# Patient Record
Sex: Female | Born: 1955
Health system: Southern US, Community
[De-identification: ages and names within clinical notes are randomized; demographics above are authoritative.]

## PROBLEM LIST (undated history)

## (undated) DIAGNOSIS — E079 Disorder of thyroid, unspecified: Secondary | ICD-10-CM

## (undated) DIAGNOSIS — C50919 Malignant neoplasm of unspecified site of unspecified female breast: Secondary | ICD-10-CM

## (undated) DIAGNOSIS — I4891 Unspecified atrial fibrillation: Secondary | ICD-10-CM

## (undated) DIAGNOSIS — C801 Malignant (primary) neoplasm, unspecified: Secondary | ICD-10-CM

## (undated) HISTORY — DX: Disorder of thyroid, unspecified: E07.9

## (undated) HISTORY — DX: Malignant (primary) neoplasm, unspecified: C80.1

---

## 1995-06-26 HISTORY — PX: MASTECTOMY: SHX3

## 1999-08-07 ENCOUNTER — Encounter (HOSPITAL_BASED_OUTPATIENT_CLINIC_OR_DEPARTMENT_OTHER): Payer: Self-pay | Admitting: General Surgery

## 1999-08-07 ENCOUNTER — Encounter: Admission: RE | Admit: 1999-08-07 | Discharge: 1999-08-07 | Payer: Self-pay | Admitting: General Surgery

## 2001-02-14 ENCOUNTER — Encounter: Admission: RE | Admit: 2001-02-14 | Discharge: 2001-02-14 | Payer: Self-pay | Admitting: General Surgery

## 2001-02-14 ENCOUNTER — Encounter (HOSPITAL_BASED_OUTPATIENT_CLINIC_OR_DEPARTMENT_OTHER): Payer: Self-pay | Admitting: General Surgery

## 2001-12-09 ENCOUNTER — Ambulatory Visit (HOSPITAL_COMMUNITY): Admission: RE | Admit: 2001-12-09 | Discharge: 2001-12-09 | Payer: Self-pay | Admitting: General Surgery

## 2001-12-09 ENCOUNTER — Encounter (INDEPENDENT_AMBULATORY_CARE_PROVIDER_SITE_OTHER): Payer: Self-pay

## 2001-12-09 ENCOUNTER — Encounter: Payer: Self-pay | Admitting: General Surgery

## 2002-01-06 ENCOUNTER — Encounter: Payer: Self-pay | Admitting: General Surgery

## 2002-01-12 ENCOUNTER — Encounter (INDEPENDENT_AMBULATORY_CARE_PROVIDER_SITE_OTHER): Payer: Self-pay | Admitting: Specialist

## 2002-01-12 ENCOUNTER — Observation Stay (HOSPITAL_COMMUNITY): Admission: RE | Admit: 2002-01-12 | Discharge: 2002-01-13 | Payer: Self-pay | Admitting: General Surgery

## 2002-02-26 ENCOUNTER — Encounter: Admission: RE | Admit: 2002-02-26 | Discharge: 2002-02-26 | Payer: Self-pay | Admitting: General Surgery

## 2002-02-26 ENCOUNTER — Encounter (HOSPITAL_BASED_OUTPATIENT_CLINIC_OR_DEPARTMENT_OTHER): Payer: Self-pay | Admitting: General Surgery

## 2006-01-24 ENCOUNTER — Encounter: Admission: RE | Admit: 2006-01-24 | Discharge: 2006-01-24 | Payer: Self-pay | Admitting: Family Medicine

## 2007-02-12 ENCOUNTER — Encounter: Admission: RE | Admit: 2007-02-12 | Discharge: 2007-02-12 | Payer: Self-pay | Admitting: Family Medicine

## 2007-02-27 ENCOUNTER — Encounter: Admission: RE | Admit: 2007-02-27 | Discharge: 2007-02-27 | Payer: Self-pay | Admitting: Family Medicine

## 2008-02-26 ENCOUNTER — Encounter: Admission: RE | Admit: 2008-02-26 | Discharge: 2008-02-26 | Payer: Self-pay | Admitting: Family Medicine

## 2008-03-04 ENCOUNTER — Encounter: Admission: RE | Admit: 2008-03-04 | Discharge: 2008-03-04 | Payer: Self-pay | Admitting: Family Medicine

## 2009-03-03 ENCOUNTER — Encounter: Admission: RE | Admit: 2009-03-03 | Discharge: 2009-03-03 | Payer: Self-pay | Admitting: Family Medicine

## 2009-03-10 ENCOUNTER — Encounter: Admission: RE | Admit: 2009-03-10 | Discharge: 2009-03-10 | Payer: Self-pay | Admitting: Family Medicine

## 2010-03-21 ENCOUNTER — Encounter: Admission: RE | Admit: 2010-03-21 | Discharge: 2010-03-21 | Payer: Self-pay | Admitting: Family Medicine

## 2010-11-10 NOTE — Op Note (Signed)
[facility]  Patient:    LAQUASHIA, MERGENTHALER Visit Number: [id number] MRN: [id number]          Service Type: SUR Location: [id number] [id number] Attending Physician:  Caleen Essex Dictated by:   Ollen Gross. Vernell Morgans, M.D. Proc. Date: 01/12/02 Admit Date:  01/12/2002 Discharge Date: 01/13/2002                             Operative Report  PREOPERATIVE DIAGNOSIS:  Diffusely enlarged thyroid with atypical follicular cells on biopsy of a left lobe nodule.  POSTOPERATIVE DIAGNOSIS:  Diffusely enlarged thyroid with atypical follicular cells on biopsy of a left lobe nodule.  PROCEDURE:  Total thyroidectomy.  SURGEON:  Ollen Gross. Vernell Morgans, M.D.  ASSISTANT:  Velora Heckler, M.D.  ANESTHESIA:  General endotracheal.  DESCRIPTION OF PROCEDURE:  After informed consent was obtained, the patient was brought to the operating room and placed in the supine position on the operating table. After adequate induction of general anesthesia, the patients neck was prepped with Betadine and draped in the usual sterile manner. The patient had had a prior isthmusectomy of her thyroid gland back in 1991 and a transverse cervical incision was made along the line of her previous incision with a 15 blade knife. This incision was carried down through the skin, subcutaneous tissue and platysma using the Bovie electrocautery. The platysma was then grasped with Allis clamps initially superiorly and a subplatysmal flap was created up to the thyroid notch. The platysma inferiorly was then grasped with the Allis clamps and another subplatysmal flap was created down near the sternal notch. Using sharp dissection with the electrocautery, the midline of the neck was identified and opened with the electrocautery until the thyroid gland was identified. Attention was initially turned towards the left lobe of the thyroid. The muscle was densely adherent to the thyroid gland from her prior operation and using  careful dissection with both the electrocautery as well as blunt dissection with a Kidner, the muscle was moved away from the surface of the thyroid gland. The thyroid gland was then able to rotated up medially. Several small veins laterally were identified using blunt dissection and each of these was clipped with small clips, two proximally, one distally and then the veins were divided between the two. Attention was then turned to the superior pole and using careful dissection right on the surface of the gland, the superior pole was identified and surrounded with a right angled clamp. The 2-0 silk ties were passed around the superior pole and tied proximally and distally and the tissue was then divided between the two ties. Once the superior pole was mobilized, attention was then turned to the mid portion and lower pole of the thyroid gland. Again rotating the thyroid medially, blunt dissection was carried out along the surface of the thyroid gland. The superior parathyroid on the left side was identified. The dissection of the mid portion and inferior pole of the thyroid gland was carried out bluntly again on the surface of the gland. The inferior parathyroid was not identified and was not encountered during this dissection. Once the thyroid gland had been mobilized, the nerve deep to the thyroid gland was identified and care was taken to maintain a safe distance from this nerve. Once the thyroid had been rotated up beyond where the nerve was located, dissection was then carried out to separate the thyroid from the trachea using more  sharp dissection with the electrocautery until the gland was free and able to be removed. This was sent to the pathologist for further evaluation. Several small vessels on the pretracheal space were identified and were either controlled with the electrocautery or with small clips. A piece of Surgicel and gauze packing was then placed in the bed where the  thyroid gland used to be. Attention was then turned to the right side and again tedious dissection was carried out to separate the thyroid gland from the muscles. Once this had been accomplished, the muscles were retracted laterally and blunt dissection was carried out to free the anterior surface of the thyroid gland until the thyroid gland could be mobilized more medially with some finger retraction. Several small veins on the anterior surface of the thyroid gland required ligation with clips and division. Attention was then turned to the superior pole where using blunt dissection with the right angled clamp, the superior pole vessels were dissected right on the surface of the thyroid gland. The 2-0 silk ties were passed around these vessels and ligated and the superior pole vessels were divided between the two ties. A second medium clip was placed on the superior pole vessels. Some of the superior pole was then separated from the trachea using sharp dissection with the electrocautery. Attention was then turned to the medial and inferior pole areas of the thyroid gland where continued blunt dissection was carried out so that the thyroid gland could be rolled medially under finger retraction. Posteriorly the inferior thyroid artery and middle thyroid vein were identified and were clipped with small clips and divided. The gland was then rolled more medially in the area where the nerve was located and care was taken to stay a safe distance away from the nerve. There were significant adhesions in this area because of a prior dissection. Once the gland had been rolled medially and the nerve was identified, the rest of the posterior dissection was carried out with the electrocautery to separate the thyroid gland from the trachea. Several small vessels on the anterior surface of the trachea were controlled with either clips or electrocautery. The neck was then irrigated with saline and  appeared to be hemostatic. Another small piece of Surgicel was placed in the bed where the right lobe had been. The wound was examined again and found to be  hemostatic. The strap muscles were then reapproximated along the midline with interrupted 3-0 Vicryl stitches. The platysma was reapproximated using interrupted 3-0 Vicryl stitches and the skin was closed with running 4-0 Monocryl subcuticular stitch. Benzoin and Steri-Strips were applied as well as a sterile dressing. The patient tolerated the procedure well. At the end of the case, all needle, sponge and instrument counts were correct. The patient was then awakened and taken to the recovery room in stable condition. Of note, the right sided superior parathyroid gland was also identified and the inferior was never encountered during the dissection. Dictated by:   Ollen Gross. Vernell Morgans, M.D. Attending Physician:  Caleen Essex DD:  01/14/02 TD:  01/19/02 Job: [id number]

## 2011-04-30 ENCOUNTER — Other Ambulatory Visit: Payer: Self-pay | Admitting: Family Medicine

## 2011-04-30 DIAGNOSIS — Z9012 Acquired absence of left breast and nipple: Secondary | ICD-10-CM

## 2011-04-30 DIAGNOSIS — Z1231 Encounter for screening mammogram for malignant neoplasm of breast: Secondary | ICD-10-CM

## 2011-05-01 ENCOUNTER — Ambulatory Visit
Admission: RE | Admit: 2011-05-01 | Discharge: 2011-05-01 | Disposition: A | Discharge status: Home / Self Care | Payer: 59 | Source: Ambulatory Visit | Attending: Family Medicine | Admitting: Family Medicine

## 2011-05-01 ENCOUNTER — Other Ambulatory Visit: Payer: Self-pay | Admitting: Family Medicine

## 2011-05-01 DIAGNOSIS — Z9012 Acquired absence of left breast and nipple: Secondary | ICD-10-CM

## 2011-05-01 DIAGNOSIS — Z1231 Encounter for screening mammogram for malignant neoplasm of breast: Secondary | ICD-10-CM

## 2012-04-08 ENCOUNTER — Other Ambulatory Visit: Payer: Self-pay | Admitting: Family Medicine

## 2012-04-08 DIAGNOSIS — Z1231 Encounter for screening mammogram for malignant neoplasm of breast: Secondary | ICD-10-CM

## 2012-04-08 DIAGNOSIS — Z9012 Acquired absence of left breast and nipple: Secondary | ICD-10-CM

## 2012-05-15 ENCOUNTER — Ambulatory Visit
Admission: RE | Admit: 2012-05-15 | Discharge: 2012-05-15 | Disposition: A | Discharge status: Home / Self Care | Payer: 59 | Source: Ambulatory Visit | Attending: Family Medicine | Admitting: Family Medicine

## 2012-05-15 DIAGNOSIS — Z1231 Encounter for screening mammogram for malignant neoplasm of breast: Secondary | ICD-10-CM

## 2012-05-15 DIAGNOSIS — Z9012 Acquired absence of left breast and nipple: Secondary | ICD-10-CM

## 2013-02-17 ENCOUNTER — Other Ambulatory Visit (INDEPENDENT_AMBULATORY_CARE_PROVIDER_SITE_OTHER): Payer: 59

## 2013-02-17 DIAGNOSIS — E039 Hypothyroidism, unspecified: Secondary | ICD-10-CM

## 2013-02-17 DIAGNOSIS — Z0000 Encounter for general adult medical examination without abnormal findings: Secondary | ICD-10-CM

## 2013-02-17 LAB — POCT CBC
HCT, POC: 39.7 % (ref 37.7–47.9)
Hemoglobin: 13.1 g/dL (ref 12.2–16.2)
Lymph, poc: 1.5 (ref 0.6–3.4)
MCHC: 33.1 g/dL (ref 31.8–35.4)
MCV: 88 fL (ref 80–97)
POC Granulocyte: 4.3 (ref 2–6.9)
WBC: 6.1 10*3/uL (ref 4.6–10.2)

## 2013-02-19 LAB — BMP8+EGFR
BUN/Creatinine Ratio: 13 (ref 9–23)
Chloride: 100 mmol/L (ref 97–108)
GFR calc Af Amer: 101 mL/min/{1.73_m2} (ref >59)
Glucose: 85 mg/dL (ref 65–99)
Potassium: 4.9 mmol/L (ref 3.5–5.2)

## 2013-02-19 LAB — HEPATIC FUNCTION PANEL
AST: 17 IU/L (ref 0–40)
Alkaline Phosphatase: 39 IU/L (ref 39–117)
Bilirubin, Direct: 0.11 mg/dL (ref 0.00–0.40)
Total Protein: 6.8 g/dL (ref 6.0–8.5)

## 2013-02-19 LAB — THYROID PANEL WITH TSH
Free Thyroxine Index: 3 (ref 1.2–4.9)
T3 Uptake Ratio: 29 % (ref 24–39)
T4, Total: 10.3 ug/dL (ref 4.5–12.0)

## 2013-02-19 LAB — NMR, LIPOPROFILE
Cholesterol: 198 mg/dL (ref <200)
HDL Particle Number: 41.4 umol/L (ref >=30.5)
LDL Size: 21.2 nm (ref >20.5)
LP-IR Score: <25 (ref <=45)

## 2013-02-20 ENCOUNTER — Telehealth: Payer: Self-pay | Admitting: Nurse Practitioner

## 2013-02-20 MED ORDER — LEVOTHYROXINE SODIUM 88 MCG PO TABS: 88.0000 ug | ORAL_TABLET | Freq: Every day | ORAL | Status: DC

## 2013-02-20 NOTE — Telephone Encounter (Signed)
Thyroid med rf'd and pts labs were normal

## 2013-02-20 NOTE — Patient Instructions (Signed)
Pt needs rx for thyroid pil will call back with dose

## 2013-03-23 ENCOUNTER — Ambulatory Visit (INDEPENDENT_AMBULATORY_CARE_PROVIDER_SITE_OTHER): Payer: 59 | Admitting: Family Medicine

## 2013-03-23 VITALS — BP 128/72 | HR 80 | Temp 97.5°F | Ht 65.5 in | Wt 149.8 lb

## 2013-03-23 DIAGNOSIS — D72829 Elevated white blood cell count, unspecified: Secondary | ICD-10-CM | POA: Insufficient documentation

## 2013-03-23 DIAGNOSIS — R509 Fever, unspecified: Secondary | ICD-10-CM

## 2013-03-23 LAB — POCT URINALYSIS DIPSTICK
Bilirubin, UA: NEGATIVE
Glucose, UA: NEGATIVE
Ketones, UA: NEGATIVE
Nitrite, UA: POSITIVE
Protein, UA: 200
Spec Grav, UA: 1.01
Urobilinogen, UA: NEGATIVE
pH, UA: 6.5

## 2013-03-23 LAB — POCT CBC
Granulocyte percent: 82.7 %G — AB (ref 37–80)
HCT, POC: 36.9 % — AB (ref 37.7–47.9)
Hemoglobin: 12.2 g/dL (ref 12.2–16.2)
Lymph, poc: 1.7 (ref 0.6–3.4)
MCH, POC: 28.4 pg (ref 27–31.2)
MCHC: 33.1 g/dL (ref 31.8–35.4)
MCV: 85.8 fL (ref 80–97)
MPV: 8.1 fL (ref 0–99.8)
POC Granulocyte: 10.3 — AB (ref 2–6.9)
POC LYMPH PERCENT: 13.7 %L (ref 10–50)
Platelet Count, POC: 223 10*3/uL (ref 142–424)
RBC: 4.3 M/uL (ref 4.04–5.48)
RDW, POC: 13.7 %
WBC: 12.5 10*3/uL — AB (ref 4.6–10.2)

## 2013-03-23 LAB — POCT INFLUENZA A/B
Influenza A, POC: NEGATIVE
Influenza B, POC: NEGATIVE

## 2013-03-23 LAB — POCT UA - MICROSCOPIC ONLY
Casts, Ur, LPF, POC: NEGATIVE
Crystals, Ur, HPF, POC: NEGATIVE
Mucus, UA: NEGATIVE
Yeast, UA: NEGATIVE

## 2013-03-23 MED ORDER — AMOXICILLIN 875 MG PO TABS: 875.0000 mg | ORAL_TABLET | Freq: Two times a day (BID) | ORAL | Status: DC

## 2013-03-23 NOTE — Progress Notes (Signed)
Patient ID: MELODI HAPPEL, female   DOB: July 20, 1955, 57 y.o.   MRN: [id number] SUBJECTIVE: CC: Chief Complaint  Patient presents with  . Fever    started Wednesday, no other sxs    HPI: Fever and chills since last Wednesday.yesterday was 101. No urinary Sx. Nothing else but fever,no sore throat, no runny nose. Was achey.  Past Medical History  Diagnosis Date  . Thyroid disease   . Cancer    Past Surgical History  Procedure Laterality Date  . Mastectomy Left 1997   History   Social History  . Marital Status: Divorced    Spouse Name: N/A    Number of Children: N/A  . Years of Education: N/A   Occupational History  . Not on file.   Social History Main Topics  . Smoking status: Never Smoker   . Smokeless tobacco: Not on file  . Alcohol Use: No  . Drug Use: No  . Sexual Activity: Not on file   Other Topics Concern  . Not on file   Social History Narrative  . No narrative on file   Family History  Problem Relation Age of Onset  . Heart disease Mother   . Hypertension Mother   . Cancer Sister   . Cancer Sister   . Cancer Sister    Current Outpatient Prescriptions on File Prior to Visit  Medication Sig Dispense Refill  . levothyroxine (SYNTHROID, LEVOTHROID) 88 MCG tablet Take 1 tablet (88 mcg total) by mouth daily.  90 tablet  3   No current facility-administered medications on file prior to visit.   No Known Allergies  There is no immunization history on file for this patient. Prior to Admission medications   Medication Sig Start Date End Date Taking? Authorizing Provider  levothyroxine (SYNTHROID, LEVOTHROID) 88 MCG tablet Take 1 tablet (88 mcg total) by mouth daily. 02/20/13  Yes Mary-Margaret Daphine Deutscher, FNP     ROS: As above in the HPI. All other systems are stable or negative.  OBJECTIVE: APPEARANCE:  Patient in no acute distress.The patient appeared well nourished and normally developed. Acyanotic. Waist: VITAL SIGNS:BP 128/72  Pulse 80   Temp(Src) 97.5 F (36.4 C) (Oral)  Ht 5' 5.5" (1.664 m)  Wt 149 lb 12.8 oz (67.949 kg)  BMI 24.54 kg/m2 WF  SKIN: warm and  Dry without overt rashes, tattoos and scars  HEAD and Neck: without JVD, Head and scalp: normal Eyes:No scleral icterus. Fundi normal, eye movements normal. Ears: Auricle normal, canal normal, Tympanic membranes normal, insufflation normal. Nose: normal Throat: normal Neck & thyroid: normal  CHEST & LUNGS: Chest wall: normal Lungs: Clear  CVS: Reveals the PMI to be normally located. Regular rhythm, First and Second Heart sounds are normal,  absence of murmurs, rubs or gallops. Peripheral vasculature: Radial pulses: normal Dorsal pedis pulses: normal Posterior pulses: normal  ABDOMEN:  Appearance: normal Benign, no organomegaly, no masses, no Abdominal Aortic enlargement. No Guarding , no rebound. No Bruits. Bowel sounds: normal  RECTAL: N/A GU: N/A  EXTREMETIES: nonedematous.  MUSCULOSKELETAL:  Spine: normal Joints: intact  NEUROLOGIC: oriented to time,place and person; nonfocal. Strength is normal Sensory is normal Reflexes are normal Cranial Nerves are normal.  Results for orders placed in visit on 03/23/13  POCT CBC      Result Value Range   WBC 12.5 (*) 4.6 - 10.2 K/uL   Lymph, poc 1.7  0.6 - 3.4   POC LYMPH PERCENT 13.7  10 - 50 %L   POC  Granulocyte 10.3 (*) 2 - 6.9   Granulocyte percent 82.7 (*) 37 - 80 %G   RBC 4.3  4.04 - 5.48 M/uL   Hemoglobin 12.2  12.2 - 16.2 g/dL   HCT, POC 25.3 (*) 66.4 - 47.9 %   MCV 85.8  80 - 97 fL   MCH, POC 28.4  27 - 31.2 pg   MCHC 33.1  31.8 - 35.4 g/dL   RDW, POC 40.3     Platelet Count, POC 223.0  142 - 424 K/uL   MPV 8.1  0 - 99.8 fL  POCT INFLUENZA A/B      Result Value Range   Influenza A, POC Negative     Influenza B, POC Negative    POCT UA - MICROSCOPIC ONLY      Result Value Range   WBC, Ur, HPF, POC 15-20     RBC, urine, microscopic 10-15     Bacteria, U Microscopic many      Mucus, UA neg     Epithelial cells, urine per micros occ     Crystals, Ur, HPF, POC neg     Casts, Ur, LPF, POC neg     Yeast, UA neg    POCT URINALYSIS DIPSTICK      Result Value Range   Color, UA yellow     Clarity, UA cloudy     Glucose, UA neg     Bilirubin, UA neg     Ketones, UA neg     Spec Grav, UA 1.010     Blood, UA large     pH, UA 6.5     Protein, UA 200     Urobilinogen, UA negative     Nitrite, UA pos     Leukocytes, UA large (3+)      ASSESSMENT: Fever, unspecified - Plan: POCT CBC, POCT Influenza A/B, POCT UA - Microscopic Only, POCT urinalysis dipstick, Urine culture, amoxicillin (AMOXIL) 875 MG tablet  Leucocytosis  Suspect that she has a viral syndrome , however with the leukocytosis and left shift will cover for a respiratory infection and recheck in a few days.  PLAN: Orders Placed This Encounter  Procedures  . Urine culture  . POCT CBC  . POCT Influenza A/B  . POCT UA - Microscopic Only  . POCT urinalysis dipstick   Meds ordered this encounter  Medications  . amoxicillin (AMOXIL) 875 MG tablet    Sig: Take 1 tablet (875 mg total) by mouth 2 (two) times daily.    Dispense:  20 tablet    Refill:  0    Return in about 4 days (around 03/27/2013).  Gianmarco Roye P. Modesto Charon, M.D.

## 2013-03-25 LAB — URINE CULTURE

## 2013-03-26 ENCOUNTER — Other Ambulatory Visit: Payer: Self-pay | Admitting: Family Medicine

## 2013-03-26 DIAGNOSIS — N390 Urinary tract infection, site not specified: Secondary | ICD-10-CM

## 2013-03-26 MED ORDER — CIPROFLOXACIN HCL 500 MG PO TABS: 500.0000 mg | ORAL_TABLET | Freq: Two times a day (BID) | ORAL | Status: DC

## 2013-03-26 NOTE — Progress Notes (Signed)
Quick Note:  Labs abnormal. UTI Will need to change antibiotic to cipro ______

## 2013-03-27 ENCOUNTER — Ambulatory Visit: Payer: 59 | Admitting: Family Medicine

## 2013-03-31 ENCOUNTER — Telehealth: Payer: Self-pay | Admitting: Family Medicine

## 2013-04-07 ENCOUNTER — Ambulatory Visit (INDEPENDENT_AMBULATORY_CARE_PROVIDER_SITE_OTHER): Payer: 59 | Admitting: Nurse Practitioner

## 2013-04-07 ENCOUNTER — Encounter (INDEPENDENT_AMBULATORY_CARE_PROVIDER_SITE_OTHER): Payer: Self-pay

## 2013-04-07 VITALS — BP 130/84 | HR 74 | Temp 98.4°F | Ht 64.0 in | Wt 144.0 lb

## 2013-04-07 DIAGNOSIS — E039 Hypothyroidism, unspecified: Secondary | ICD-10-CM

## 2013-04-07 DIAGNOSIS — Z01419 Encounter for gynecological examination (general) (routine) without abnormal findings: Secondary | ICD-10-CM

## 2013-04-07 DIAGNOSIS — Z124 Encounter for screening for malignant neoplasm of cervix: Secondary | ICD-10-CM

## 2013-04-07 DIAGNOSIS — Z0000 Encounter for general adult medical examination without abnormal findings: Secondary | ICD-10-CM

## 2013-04-07 LAB — POCT URINALYSIS DIPSTICK
Bilirubin, UA: NEGATIVE
Glucose, UA: NEGATIVE
Nitrite, UA: NEGATIVE

## 2013-04-07 LAB — POCT CBC
Lymph, poc: 1.8 (ref 0.6–3.4)
MCH, POC: 30 pg (ref 27–31.2)
MCHC: 34.3 g/dL (ref 31.8–35.4)
MPV: 6.8 fL (ref 0–99.8)
POC LYMPH PERCENT: 29.3 %L (ref 10–50)
Platelet Count, POC: 288 10*3/uL (ref 142–424)
RBC: 4.2 M/uL (ref 4.04–5.48)
RDW, POC: 14.5 %
WBC: 6.3 10*3/uL (ref 4.6–10.2)

## 2013-04-07 LAB — POCT UA - MICROSCOPIC ONLY
Mucus, UA: NEGATIVE
Yeast, UA: NEGATIVE

## 2013-04-07 MED ORDER — LEVOTHYROXINE SODIUM 88 MCG PO TABS: 88.0000 ug | ORAL_TABLET | Freq: Every day | ORAL | Status: DC

## 2013-04-07 NOTE — Patient Instructions (Signed)

## 2013-04-07 NOTE — Progress Notes (Signed)
Subjective:    Patient ID: Angela SAMPSEL, female    DOB: 10-15-1955, 57 y.o.   MRN: [id number]  HPI  Patient here today for CPE and PAP- SHe just came off antibiotic for fever of unknown origin. She is doing better- No fever. No other complaints tooday. Patient Active Problem List   Diagnosis Date Noted  . Leucocytosis 03/23/2013  . Fever, unspecified 03/23/2013   Outpatient Encounter Prescriptions as of 04/07/2013  Medication Sig Dispense Refill  . levothyroxine (SYNTHROID, LEVOTHROID) 88 MCG tablet Take 1 tablet (88 mcg total) by mouth daily.  90 tablet  3  . [DISCONTINUED] amoxicillin (AMOXIL) 875 MG tablet Take 1 tablet (875 mg total) by mouth 2 (two) times daily.  20 tablet  0  . [DISCONTINUED] ciprofloxacin (CIPRO) 500 MG tablet Take 1 tablet (500 mg total) by mouth 2 (two) times daily.  20 tablet  0   No facility-administered encounter medications on file as of 04/07/2013.       Review of Systems  Constitutional: Negative.   HENT: Negative.   Eyes: Negative.   Respiratory: Negative.   Cardiovascular: Negative.   Gastrointestinal: Negative.   Endocrine: Negative.   Genitourinary: Negative.   Musculoskeletal: Negative.   Neurological: Negative.   Hematological: Negative.   Psychiatric/Behavioral: Negative.        Objective:   Physical Exam  Constitutional: She is oriented to person, place, and time. She appears well-developed and well-nourished.  HENT:  Head: Normocephalic.  Right Ear: Hearing, tympanic membrane, external ear and ear canal normal.  Left Ear: Hearing, tympanic membrane, external ear and ear canal normal.  Nose: Nose normal.  Mouth/Throat: Uvula is midline and oropharynx is clear and moist.  Eyes: Conjunctivae and EOM are normal. Pupils are equal, round, and reactive to light.  Neck: Normal range of motion and full passive range of motion without pain. Neck supple. No JVD present. Carotid bruit is not present. No mass and no thyromegaly present.   Cardiovascular: Normal rate, normal heart sounds and intact distal pulses.   No murmur heard. Pulmonary/Chest: Effort normal and breath sounds normal. Right breast exhibits no inverted nipple, no mass, no nipple discharge, no skin change and no tenderness.  Left mastectomy- reconstruction  Abdominal: Soft. Bowel sounds are normal. She exhibits no mass. There is no tenderness.  Genitourinary: Vagina normal and uterus normal. Guaiac negative stool. No breast swelling, tenderness, discharge or bleeding.  bimanual exam-No adnexal masses or tenderness. Cervix nonparous and pink- no discharge  Musculoskeletal: Normal range of motion.  Lymphadenopathy:    She has no cervical adenopathy.  Neurological: She is alert and oriented to person, place, and time.  Skin: Skin is warm and dry.  Psychiatric: She has a normal mood and affect. Her behavior is normal. Judgment and thought content normal.   BP 130/84  Pulse 74  Temp(Src) 98.4 F (36.9 C) (Oral)  Ht 5\' 4"  (1.626 m)  Wt 144 lb (65.318 kg)  BMI 24.71 kg/m2  Results for orders placed in visit on 04/07/13  POCT UA - MICROSCOPIC ONLY      Result Value Range   WBC, Ur, HPF, POC 5-10     RBC, urine, microscopic 1-5     Bacteria, U Microscopic neg     Mucus, UA neg     Epithelial cells, urine per micros occ     Crystals, Ur, HPF, POC neg     Casts, Ur, LPF, POC neg     Yeast, UA neg  POCT URINALYSIS DIPSTICK      Result Value Range   Color, UA yellow     Clarity, UA clear     Glucose, UA neg     Bilirubin, UA neg     Ketones, UA neg     Spec Grav, UA <=1.005     Blood, UA trace     pH, UA 7.5     Protein, UA 100     Urobilinogen, UA negative     Nitrite, UA neg     Leukocytes, UA Trace           Assessment & Plan:   1. Encounter for routine gynecological examination   2. Annual physical exam   3. Unspecified hypothyroidism    Orders Placed This Encounter  Procedures  . POCT UA - Microscopic Only  . POCT urinalysis  dipstick  . POCT CBC   Meds ordered this encounter  Medications  . levothyroxine (SYNTHROID, LEVOTHROID) 88 MCG tablet    Sig: Take 1 tablet (88 mcg total) by mouth daily.    Dispense:  90 tablet    Refill:  3    Order Specific Question:  Supervising Provider    Answer:  Deborra Medina    Continue all meds Labs pending Diet and exercise encouraged Health maintenance reviewed Follow up in 6 month  Mary-Margaret Daphine Deutscher, FNP

## 2013-04-08 ENCOUNTER — Encounter: Payer: Self-pay | Admitting: Nurse Practitioner

## 2013-04-09 ENCOUNTER — Encounter: Payer: Self-pay | Admitting: *Deleted

## 2013-04-10 ENCOUNTER — Telehealth: Payer: Self-pay | Admitting: Nurse Practitioner

## 2013-04-10 LAB — PAP IG W/ RFLX HPV ASCU

## 2013-04-13 NOTE — Telephone Encounter (Signed)
PAP wnl- repeat in 2 years

## 2013-04-13 NOTE — Telephone Encounter (Signed)
Please review pap. patient is aware of all other labs

## 2013-04-13 NOTE — Telephone Encounter (Signed)
Patient aware.

## 2013-04-17 ENCOUNTER — Other Ambulatory Visit: Payer: Self-pay

## 2013-04-17 DIAGNOSIS — Z9012 Acquired absence of left breast and nipple: Secondary | ICD-10-CM

## 2013-04-17 DIAGNOSIS — Z1231 Encounter for screening mammogram for malignant neoplasm of breast: Secondary | ICD-10-CM

## 2013-05-19 ENCOUNTER — Ambulatory Visit
Admission: RE | Admit: 2013-05-19 | Discharge: 2013-05-19 | Disposition: A | Discharge status: Home / Self Care | Payer: 59 | Source: Ambulatory Visit

## 2013-05-19 DIAGNOSIS — Z9012 Acquired absence of left breast and nipple: Secondary | ICD-10-CM

## 2013-05-19 DIAGNOSIS — Z1231 Encounter for screening mammogram for malignant neoplasm of breast: Secondary | ICD-10-CM

## 2013-09-17 ENCOUNTER — Other Ambulatory Visit: Payer: Self-pay | Admitting: *Deleted

## 2013-09-17 MED ORDER — LEVOTHYROXINE SODIUM 88 MCG PO TABS: 88.0000 ug | ORAL_TABLET | Freq: Every day | ORAL | Status: DC

## 2013-11-10 ENCOUNTER — Other Ambulatory Visit: Payer: Self-pay | Admitting: Nurse Practitioner

## 2014-04-09 ENCOUNTER — Other Ambulatory Visit: Payer: Self-pay

## 2014-04-13 ENCOUNTER — Telehealth: Payer: Self-pay | Admitting: Nurse Practitioner

## 2014-04-13 MED ORDER — LEVOTHYROXINE SODIUM 88 MCG PO TABS: ORAL_TABLET | ORAL | Status: DC

## 2014-04-13 NOTE — Telephone Encounter (Signed)
Pt has appt in December, needs refill until appt. Last thyroid lab 8/14.

## 2014-06-03 ENCOUNTER — Ambulatory Visit (INDEPENDENT_AMBULATORY_CARE_PROVIDER_SITE_OTHER): Payer: PRIVATE HEALTH INSURANCE | Admitting: Nurse Practitioner

## 2014-06-03 ENCOUNTER — Encounter: Payer: Self-pay | Admitting: Nurse Practitioner

## 2014-06-03 VITALS — BP 118/68 | HR 69 | Temp 97.1°F | Ht 64.0 in | Wt 146.0 lb

## 2014-06-03 DIAGNOSIS — Z13820 Encounter for screening for osteoporosis: Secondary | ICD-10-CM

## 2014-06-03 DIAGNOSIS — E039 Hypothyroidism, unspecified: Secondary | ICD-10-CM | POA: Insufficient documentation

## 2014-06-03 NOTE — Progress Notes (Signed)
   Subjective:    Patient ID: Angela Dean, female    DOB: June 29, 1955, 58 y.o.   MRN: [id number]  HPI Patient in today for follow up of hypothyroidiusm- SHe is currently on levothyroxine 79mg daily- Is doing well without complaints.    Review of Systems  Constitutional: Negative.   HENT: Negative.   Respiratory: Negative.   Cardiovascular: Negative.   Genitourinary: Negative.   Neurological: Negative.   Psychiatric/Behavioral: Negative.   All other systems reviewed and are negative.      Objective:   Physical Exam  Constitutional: She is oriented to person, place, and time. She appears well-developed and well-nourished.  HENT:  Nose: Nose normal.  Mouth/Throat: Oropharynx is clear and moist.  Eyes: EOM are normal.  Neck: Trachea normal, normal range of motion and full passive range of motion without pain. Neck supple. No JVD present. Carotid bruit is not present. No thyromegaly present.  Cardiovascular: Normal rate, regular rhythm, normal heart sounds and intact distal pulses.  Exam reveals no gallop and no friction rub.   No murmur heard. Pulmonary/Chest: Effort normal and breath sounds normal.  Abdominal: Soft. Bowel sounds are normal. She exhibits no distension and no mass. There is no tenderness.  Musculoskeletal: Normal range of motion.  Lymphadenopathy:    She has no cervical adenopathy.  Neurological: She is alert and oriented to person, place, and time. She has normal reflexes.  Skin: Skin is warm and dry.  Psychiatric: She has a normal mood and affect. Her behavior is normal. Judgment and thought content normal.   BP 118/68 mmHg  Pulse 69  Temp(Src) 97.1 F (36.2 C) (Oral)  Ht _0  (1.626 m)  Wt 146 lb (66.225 kg)  BMI 25.05 kg/m2        Assessment & Plan:  1. Hypothyroidism, unspecified hypothyroidism type - CMP14+EGFR - NMR, lipoprofile - Thyroid Panel With TSH  2. Screening for osteoporosis - DG Bone Density; Future   hemoccult cards given to  patient with directions Labs pending Health maintenance reviewed Diet and exercise encouraged Continue all meds Follow up  In 6 months   MBay Park FNP

## 2014-06-03 NOTE — Patient Instructions (Signed)
Hypothyroidism The thyroid is a large gland located in the lower front of your neck. The thyroid gland helps control metabolism. Metabolism is how your body handles food. It controls metabolism with the hormone thyroxine. When this gland is underactive (hypothyroid), it produces too little hormone.  CAUSES These include:   Absence or destruction of thyroid tissue.  Goiter due to iodine deficiency.  Goiter due to medications.  Congenital defects (since birth).  Problems with the pituitary. This causes a lack of TSH (thyroid stimulating hormone). This hormone tells the thyroid to turn out more hormone. SYMPTOMS  Lethargy (feeling as though you have no energy)  Cold intolerance  Weight gain (in spite of normal food intake)  Dry skin  Coarse hair  Menstrual irregularity (if severe, may lead to infertility)  Slowing of thought processes Cardiac problems are also caused by insufficient amounts of thyroid hormone. Hypothyroidism in the newborn is cretinism, and is an extreme form. It is important that this form be treated adequately and immediately or it will lead rapidly to retarded physical and mental development. DIAGNOSIS  To prove hypothyroidism, your caregiver may do blood tests and ultrasound tests. Sometimes the signs are hidden. It may be necessary for your caregiver to watch this illness with blood tests either before or after diagnosis and treatment. TREATMENT  Low levels of thyroid hormone are increased by using synthetic thyroid hormone. This is a safe, effective treatment. It usually takes about four weeks to gain the full effects of the medication. After you have the full effect of the medication, it will generally take another four weeks for problems to leave. Your caregiver may start you on low doses. If you have had heart problems the dose may be gradually increased. It is generally not an emergency to get rapidly to normal. HOME CARE INSTRUCTIONS   Take your  medications as your caregiver suggests. Let your caregiver know of any medications you are taking or start taking. Your caregiver will help you with dosage schedules.  As your condition improves, your dosage needs may increase. It will be necessary to have continuing blood tests as suggested by your caregiver.  Report all suspected medication side effects to your caregiver. SEEK MEDICAL CARE IF: Seek medical care if you develop:  Sweating.  Tremulousness (tremors).  Anxiety.  Rapid weight loss.  Heat intolerance.  Emotional swings.  Diarrhea.  Weakness. SEEK IMMEDIATE MEDICAL CARE IF:  You develop chest pain, an irregular heart beat (palpitations), or a rapid heart beat. MAKE SURE YOU:   Understand these instructions.  Will watch your condition.  Will get help right away if you are not doing well or get worse. Document Released: 06/11/2005 Document Revised: 09/03/2011 Document Reviewed: 01/30/2008 Exit[employer] Patient Information 2015 [employer]. This information is not intended to replace advice given to you by your health care provider. Make sure you discuss any questions you have with your health care provider.  

## 2014-06-04 LAB — CMP14+EGFR
ALT: 10 IU/L (ref 0–32)
AST: 15 IU/L (ref 0–40)
Albumin/Globulin Ratio: 2 (ref 1.1–2.5)
Albumin: 4.5 g/dL (ref 3.5–5.5)
Alkaline Phosphatase: 43 IU/L (ref 39–117)
BILIRUBIN TOTAL: 0.4 mg/dL (ref 0.0–1.2)
BUN/Creatinine Ratio: 13 (ref 9–23)
BUN: 11 mg/dL (ref 6–24)
CALCIUM: 10 mg/dL (ref 8.7–10.2)
CHLORIDE: 101 mmol/L (ref 97–108)
CO2: 29 mmol/L (ref 18–29)
Creatinine, Ser: 0.84 mg/dL (ref 0.57–1.00)
GFR calc non Af Amer: 77 mL/min/{1.73_m2} (ref >59)
GFR, EST AFRICAN AMERICAN: 89 mL/min/{1.73_m2} (ref >59)
GLUCOSE: 96 mg/dL (ref 65–99)
Globulin, Total: 2.3 g/dL (ref 1.5–4.5)
Potassium: 4.8 mmol/L (ref 3.5–5.2)
Sodium: 143 mmol/L (ref 134–144)
TOTAL PROTEIN: 6.8 g/dL (ref 6.0–8.5)

## 2014-06-04 LAB — THYROID PANEL WITH TSH
Free Thyroxine Index: 3.4 (ref 1.2–4.9)
T3 UPTAKE RATIO: 31 % (ref 24–39)
T4 TOTAL: 11 ug/dL (ref 4.5–12.0)
TSH: 4.09 u[IU]/mL (ref 0.450–4.500)

## 2014-06-04 LAB — NMR, LIPOPROFILE
Cholesterol: 189 mg/dL (ref 100–199)
HDL Cholesterol by NMR: 77 mg/dL (ref >39)
HDL Particle Number: 42.4 umol/L (ref >=30.5)
LDL Particle Number: 957 nmol/L (ref <1000)
LDL SIZE: 21.9 nm (ref >20.5)
LDL-C: 95 mg/dL (ref 0–99)
LP-IR Score: <25 (ref <=45)
Small LDL Particle Number: 109 nmol/L (ref <=527)
TRIGLYCERIDES BY NMR: 83 mg/dL (ref 0–149)

## 2014-06-15 ENCOUNTER — Other Ambulatory Visit: Payer: Self-pay

## 2014-06-15 DIAGNOSIS — Z1231 Encounter for screening mammogram for malignant neoplasm of breast: Secondary | ICD-10-CM

## 2014-06-15 DIAGNOSIS — Z9012 Acquired absence of left breast and nipple: Secondary | ICD-10-CM

## 2014-07-08 ENCOUNTER — Ambulatory Visit
Admission: RE | Admit: 2014-07-08 | Discharge: 2014-07-08 | Disposition: A | Discharge status: Home / Self Care | Payer: No Typology Code available for payment source | Source: Ambulatory Visit

## 2014-07-08 DIAGNOSIS — Z9012 Acquired absence of left breast and nipple: Secondary | ICD-10-CM

## 2014-07-08 DIAGNOSIS — Z1231 Encounter for screening mammogram for malignant neoplasm of breast: Secondary | ICD-10-CM

## 2014-07-22 ENCOUNTER — Other Ambulatory Visit: Payer: Self-pay | Admitting: Nurse Practitioner

## 2015-02-17 ENCOUNTER — Other Ambulatory Visit: Payer: Self-pay | Admitting: Nurse Practitioner

## 2015-04-12 ENCOUNTER — Other Ambulatory Visit: Payer: Self-pay | Admitting: Nurse Practitioner

## 2015-06-02 ENCOUNTER — Other Ambulatory Visit: Payer: Self-pay | Admitting: Nurse Practitioner

## 2015-06-02 NOTE — Telephone Encounter (Signed)
Last seen and last thyroid 06/03/14 MMM

## 2015-06-02 NOTE — Telephone Encounter (Signed)
Patient aware that she will need to be seen for any further refills 

## 2015-06-02 NOTE — Telephone Encounter (Signed)
Last refill without being seen 

## 2015-10-11 ENCOUNTER — Encounter: Payer: Self-pay | Admitting: Family Medicine

## 2015-10-11 ENCOUNTER — Ambulatory Visit (INDEPENDENT_AMBULATORY_CARE_PROVIDER_SITE_OTHER): Payer: 59 | Admitting: Family Medicine

## 2015-10-11 VITALS — BP 115/75 | HR 69 | Temp 97.7°F | Ht 64.0 in | Wt 145.6 lb

## 2015-10-11 DIAGNOSIS — E039 Hypothyroidism, unspecified: Secondary | ICD-10-CM

## 2015-10-11 DIAGNOSIS — Z0000 Encounter for general adult medical examination without abnormal findings: Secondary | ICD-10-CM

## 2015-10-11 DIAGNOSIS — Z139 Encounter for screening, unspecified: Secondary | ICD-10-CM

## 2015-10-11 DIAGNOSIS — R7989 Other specified abnormal findings of blood chemistry: Secondary | ICD-10-CM

## 2015-10-11 MED ORDER — LEVOTHYROXINE SODIUM 88 MCG PO TABS: 88.0000 ug | ORAL_TABLET | Freq: Every day | ORAL | Status: DC

## 2015-10-11 NOTE — Patient Instructions (Addendum)
Great to meet you!  We will call with labs within 1 week  Lets plan on seeing you at least once a year, remember your pap smear is due in October  Consider a colonoscopy

## 2015-10-11 NOTE — Progress Notes (Signed)
   HPI  Patient presents today here for checkup and medication refill.  She takes Synthroid every day She feels well, denies any decreased energy, decreased mood. 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  She denies any shortness of breath, She is not a smoker. She watches her diet closely, however no formal exercise routine.  PMH: Smoking status noted ROS: Per HPI  Objective: BP 115/75 mmHg  Pulse 69  Temp(Src) 97.7 F (36.5 C) (Oral)  Ht '5\' 4"'$  (1.626 m)  Wt 145 lb 9.6 oz (66.044 kg)  BMI 24.98 kg/m2 Gen: NAD, alert, cooperative with exam HEENT: NCAT, oropharynx clear and moist, TMs normal bilaterally CV: RRR, good S1/S2, no murmur Resp: CTABL, no wheezes, non-labored Abd: SNTND, BS present, no guarding or organomegaly Ext: No edema, warm Neuro: Alert and oriented, No gross deficits  Assessment and plan:  # Hypothyroidism Labs Refill Synthroid It looks like she's been out for a couple of months  # Healthcare maintenance Refer for colonoscopy Labs      Orders Placed This Encounter  Procedures  . CMP14+EGFR  . CBC with Differential/Platelet  . Lipid panel  . TSH  . Hepatitis C antibody  . Ambulatory referral to Gastroenterology    Referral Priority:  Routine    Referral Type:  Consultation    Referral Reason:  Specialty Services Required    Number of Visits Requested:  1    Meds ordered this encounter  Medications  . levothyroxine (SYNTHROID, LEVOTHROID) 88 MCG tablet    Sig: Take 1 tablet (88 mcg total) by mouth daily.    Dispense:  90 tablet    Refill:  [place], MD [facility] 10/11/2015, 9:08 AM

## 2015-10-12 LAB — CMP14+EGFR
ALT: 7 IU/L (ref 0–32)
AST: 11 IU/L (ref 0–40)
Albumin/Globulin Ratio: 1.5 (ref 1.2–2.2)
Albumin: 4.1 g/dL (ref 3.6–4.8)
Alkaline Phosphatase: 59 IU/L (ref 39–117)
BUN/Creatinine Ratio: 21 (ref 12–28)
BUN: 14 mg/dL (ref 8–27)
Bilirubin Total: 0.2 mg/dL (ref 0.0–1.2)
CALCIUM: 9.5 mg/dL (ref 8.7–10.3)
CO2: 24 mmol/L (ref 18–29)
Chloride: 101 mmol/L (ref 96–106)
Creatinine, Ser: 0.67 mg/dL (ref 0.57–1.00)
GFR calc Af Amer: 110 mL/min/{1.73_m2} (ref >59)
GFR calc non Af Amer: 96 mL/min/{1.73_m2} (ref >59)
GLOBULIN, TOTAL: 2.8 g/dL (ref 1.5–4.5)
Glucose: 85 mg/dL (ref 65–99)
Potassium: 4.5 mmol/L (ref 3.5–5.2)
Sodium: 142 mmol/L (ref 134–144)
TOTAL PROTEIN: 6.9 g/dL (ref 6.0–8.5)

## 2015-10-12 LAB — LIPID PANEL
Chol/HDL Ratio: 2.5 ratio units (ref 0.0–4.4)
Cholesterol, Total: 165 mg/dL (ref 100–199)
HDL: 66 mg/dL (ref >39)
LDL Calculated: 87 mg/dL (ref 0–99)
Triglycerides: 62 mg/dL (ref 0–149)
VLDL CHOLESTEROL CAL: 12 mg/dL (ref 5–40)

## 2015-10-12 LAB — CBC WITH DIFFERENTIAL/PLATELET
BASOS: 0 %
Basophils Absolute: 0 10*3/uL (ref 0.0–0.2)
EOS (ABSOLUTE): 0.1 10*3/uL (ref 0.0–0.4)
Eos: 1 %
HEMATOCRIT: 36.2 % (ref 34.0–46.6)
HEMOGLOBIN: 11.6 g/dL (ref 11.1–15.9)
IMMATURE GRANS (ABS): 0 10*3/uL (ref 0.0–0.1)
IMMATURE GRANULOCYTES: 0 %
LYMPHS: 19 %
Lymphocytes Absolute: 1.5 10*3/uL (ref 0.7–3.1)
MCH: 26.7 pg (ref 26.6–33.0)
MCHC: 32 g/dL (ref 31.5–35.7)
MCV: 83 fL (ref 79–97)
MONOCYTES: 6 %
Monocytes Absolute: 0.5 10*3/uL (ref 0.1–0.9)
NEUTROS PCT: 74 %
Neutrophils Absolute: 5.9 10*3/uL (ref 1.4–7.0)
Platelets: 348 10*3/uL (ref 150–379)
RBC: 4.35 x10E6/uL (ref 3.77–5.28)
RDW: 16.9 % — ABNORMAL HIGH (ref 12.3–15.4)
WBC: 8 10*3/uL (ref 3.4–10.8)

## 2015-10-12 LAB — TSH: TSH: 7.11 u[IU]/mL — AB (ref 0.450–4.500)

## 2015-10-12 LAB — HEPATITIS C ANTIBODY: Hep C Virus Ab: <0.1 s/co ratio (ref 0.0–0.9)

## 2015-10-12 NOTE — Addendum Note (Signed)
Addended by: Nigel Berthold C on: 10/12/2015 09:09 AM   Modules accepted: Orders

## 2015-12-13 ENCOUNTER — Other Ambulatory Visit: Payer: Self-pay | Admitting: Pediatrics

## 2015-12-13 DIAGNOSIS — Z1231 Encounter for screening mammogram for malignant neoplasm of breast: Secondary | ICD-10-CM

## 2015-12-20 ENCOUNTER — Ambulatory Visit
Admission: RE | Admit: 2015-12-20 | Discharge: 2015-12-20 | Disposition: A | Discharge status: Home / Self Care | Payer: 59 | Source: Ambulatory Visit | Attending: Pediatrics | Admitting: Pediatrics

## 2015-12-20 DIAGNOSIS — Z1231 Encounter for screening mammogram for malignant neoplasm of breast: Secondary | ICD-10-CM

## 2016-10-25 ENCOUNTER — Other Ambulatory Visit: Payer: Self-pay | Admitting: Family Medicine

## 2016-11-06 ENCOUNTER — Telehealth: Payer: Self-pay | Admitting: Nurse Practitioner

## 2016-11-06 MED ORDER — LEVOTHYROXINE SODIUM 88 MCG PO TABS: 88.0000 ug | ORAL_TABLET | Freq: Every day | ORAL | 0 refills | Status: DC

## 2016-11-06 NOTE — Telephone Encounter (Signed)
30 day supply of Levothyroxine sent to [facility], patient aware that she must be seen before any more refills will be sent.

## 2016-11-06 NOTE — Telephone Encounter (Signed)
What is the name of the medication? Thyroid meds. Has appt on 11/27/16  Have you contacted your pharmacy to request a refill? yes  Which pharmacy would you like this sent to? [facility].   Patient notified that their request is being sent to the clinical staff for review and that they should receive a call once it is complete. If they do not receive a call within 24 hours they can check with their pharmacy or our office.

## 2016-11-27 ENCOUNTER — Ambulatory Visit (INDEPENDENT_AMBULATORY_CARE_PROVIDER_SITE_OTHER): Payer: 59 | Admitting: Nurse Practitioner

## 2016-11-27 ENCOUNTER — Encounter: Payer: Self-pay | Admitting: Nurse Practitioner

## 2016-11-27 VITALS — BP 116/66 | HR 71 | Temp 97.0°F | Ht 64.0 in | Wt 152.0 lb

## 2016-11-27 DIAGNOSIS — E039 Hypothyroidism, unspecified: Secondary | ICD-10-CM | POA: Diagnosis not present

## 2016-11-27 DIAGNOSIS — Z1211 Encounter for screening for malignant neoplasm of colon: Secondary | ICD-10-CM

## 2016-11-27 MED ORDER — LEVOTHYROXINE SODIUM 88 MCG PO TABS: 88.0000 ug | ORAL_TABLET | Freq: Every day | ORAL | 0 refills | Status: DC

## 2016-11-27 NOTE — Progress Notes (Signed)
   Subjective:    Patient ID: Angela Dean, female    DOB: 05/15/1956, 60 y.o.   MRN: [id number]  HPI Angela Dean is here today for follow up of chronic medical problem.  Outpatient Encounter Prescriptions as of 11/27/2016  Medication Sig  . levothyroxine (SYNTHROID, LEVOTHROID) 88 MCG tablet Take 1 tablet (88 mcg total) by mouth daily.   No facility-administered encounter medications on file as of 11/27/2016.     1. Hypothyroidism, unspecified type  Patient takes levothyroxine for management.     New complaints: None today.    Review of Systems  Constitutional: Negative for activity change, appetite change and fatigue.  Respiratory: Negative for cough, shortness of breath and wheezing.   Cardiovascular: Negative for chest pain and palpitations.  Gastrointestinal: Negative for abdominal distention and abdominal pain.  Musculoskeletal: Negative for arthralgias and neck pain.  Neurological: Negative for dizziness, light-headedness and headaches.  All other systems reviewed and are negative.      Objective:   Physical Exam  Constitutional: She is oriented to person, place, and time. She appears well-developed and well-nourished. No distress.  HENT:  Head: Normocephalic.  Right Ear: External ear normal.  Left Ear: External ear normal.  Nose: Nose normal.  Mouth/Throat: Oropharynx is clear and moist.  Eyes: Pupils are equal, round, and reactive to light.  Neck: Normal range of motion. Neck supple. No JVD present. No thyromegaly present.  Cardiovascular: Normal rate, regular rhythm, normal heart sounds and intact distal pulses.   No murmur heard. Pulmonary/Chest: Effort normal and breath sounds normal. No respiratory distress.  Abdominal: Soft. Bowel sounds are normal. She exhibits no distension. There is no tenderness.  Musculoskeletal: Normal range of motion. She exhibits no edema.  Lymphadenopathy:    She has no cervical adenopathy.  Neurological: She is alert and  oriented to person, place, and time.  Skin: Skin is warm and dry. No rash noted.  Psychiatric: She has a normal mood and affect. Her behavior is normal. Judgment and thought content normal.   BP 116/66   Pulse 71   Temp 97 F (36.1 C) (Oral)   Ht 5' 4" (1.626 m)   Wt 152 lb (68.9 kg)   BMI 26.09 kg/m         Assessment & Plan:  1. Hypothyroidism, unspecified type Low fat diet and exercise encouraged Labs pending Follow up in 6 months  - CMP14+EGFR - Thyroid Panel With TSH - Lipid panel - levothyroxine (SYNTHROID, LEVOTHROID) 88 MCG tablet; Take 1 tablet (88 mcg total) by mouth daily.  Dispense: 30 tablet; Refill: 0  Encouraged to schedule PAP  Mary-Margaret Martin, FNP   

## 2016-11-27 NOTE — Patient Instructions (Signed)
Fat and Cholesterol Restricted Diet Getting too much fat and cholesterol in your diet may cause health problems. Following this diet helps keep your fat and cholesterol at normal levels. This can keep you from getting sick. What types of fat should I choose?  Choose monosaturated and polyunsaturated fats. These are found in foods such as olive oil, canola oil, flaxseeds, walnuts, almonds, and seeds.  Eat more omega-3 fats. Good choices include salmon, mackerel, sardines, tuna, flaxseed oil, and ground flaxseeds.  Limit saturated fats. These are in animal products such as meats, butter, and cream. They can also be in plant products such as palm oil, palm kernel oil, and coconut oil.  Avoid foods with partially hydrogenated oils in them. These contain trans fats. Examples of foods that have trans fats are stick margarine, some tub margarines, cookies, crackers, and other baked goods. What general guidelines do I need to follow?  Check food labels. Look for the words "trans fat" and "saturated fat."  When preparing a meal: ? Fill half of your plate with vegetables and green salads. ? Fill one fourth of your plate with whole grains. Look for the word "whole" as the first word in the ingredient list. ? Fill one fourth of your plate with lean protein foods.  Eat more foods that have fiber, like apples, carrots, beans, peas, and barley.  Eat more home-cooked foods. Eat less at restaurants and buffets.  Limit or avoid alcohol.  Limit foods high in starch and sugar.  Limit fried foods.  Cook foods without frying them. Baking, boiling, grilling, and broiling are all great options.  Lose weight if you are overweight. Losing even a small amount of weight can help your overall health. It can also help prevent diseases such as diabetes and heart disease. What foods can I eat? Grains Whole grains, such as whole wheat or whole grain breads, crackers, cereals, and pasta. Unsweetened oatmeal,  bulgur, barley, quinoa, or brown rice. Corn or whole wheat flour tortillas. Vegetables Fresh or frozen vegetables (raw, steamed, roasted, or grilled). Green salads. Fruits All fresh, canned (in natural juice), or frozen fruits. Meat and Other Protein Products Ground beef (85% or leaner), grass-fed beef, or beef trimmed of fat. Skinless chicken or turkey. Ground chicken or turkey. Pork trimmed of fat. All fish and seafood. Eggs. Dried beans, peas, or lentils. Unsalted nuts or seeds. Unsalted canned or dry beans. Dairy Low-fat dairy products, such as skim or 1% milk, 2% or reduced-fat cheeses, low-fat ricotta or cottage cheese, or plain low-fat yogurt. Fats and Oils Tub margarines without trans fats. Light or reduced-fat mayonnaise and salad dressings. Avocado. Olive, canola, sesame, or safflower oils. Natural peanut or almond butter (choose ones without added sugar and oil). The items listed above may not be a complete list of recommended foods or beverages. Contact your dietitian for more options. What foods are not recommended? Grains White bread. White pasta. White rice. Cornbread. Bagels, pastries, and croissants. Crackers that contain trans fat. Vegetables White potatoes. Corn. Creamed or fried vegetables. Vegetables in a cheese sauce. Fruits Dried fruits. Canned fruit in light or heavy syrup. Fruit juice. Meat and Other Protein Products Fatty cuts of meat. Ribs, chicken wings, bacon, sausage, bologna, salami, chitterlings, fatback, hot dogs, bratwurst, and packaged luncheon meats. Liver and organ meats. Dairy Whole or 2% milk, cream, half-and-half, and cream cheese. Whole milk cheeses. Whole-fat or sweetened yogurt. Full-fat cheeses. Nondairy creamers and whipped toppings. Processed cheese, cheese spreads, or cheese curds. Sweets and Desserts Corn   syrup, sugars, honey, and molasses. Candy. Jam and jelly. Syrup. Sweetened cereals. Cookies, pies, cakes, donuts, muffins, and ice  cream. Fats and Oils Butter, stick margarine, lard, shortening, ghee, or bacon fat. Coconut, palm kernel, or palm oils. Beverages Alcohol. Sweetened drinks (such as sodas, lemonade, and fruit drinks or punches). The items listed above may not be a complete list of foods and beverages to avoid. Contact your dietitian for more information. This information is not intended to replace advice given to you by your health care provider. Make sure you discuss any questions you have with your health care provider. Document Released: 12/11/2011 Document Revised: 02/16/2016 Document Reviewed: 09/10/2013 Elsevier Interactive Patient Education  2018 Elsevier Inc.  

## 2016-11-28 LAB — CMP14+EGFR
A/G RATIO: 1.4 (ref 1.2–2.2)
ALK PHOS: 67 IU/L (ref 39–117)
ALT: 11 IU/L (ref 0–32)
AST: 11 IU/L (ref 0–40)
Albumin: 4 g/dL (ref 3.6–4.8)
BILIRUBIN TOTAL: 0.3 mg/dL (ref 0.0–1.2)
BUN/Creatinine Ratio: 16 (ref 12–28)
BUN: 12 mg/dL (ref 8–27)
CO2: 26 mmol/L (ref 18–29)
Calcium: 9.5 mg/dL (ref 8.7–10.3)
Chloride: 100 mmol/L (ref 96–106)
Creatinine, Ser: 0.73 mg/dL (ref 0.57–1.00)
GFR calc Af Amer: 103 mL/min/{1.73_m2} (ref >59)
GFR calc non Af Amer: 89 mL/min/{1.73_m2} (ref >59)
Globulin, Total: 2.8 g/dL (ref 1.5–4.5)
Glucose: 76 mg/dL (ref 65–99)
POTASSIUM: 4.1 mmol/L (ref 3.5–5.2)
Sodium: 140 mmol/L (ref 134–144)
TOTAL PROTEIN: 6.8 g/dL (ref 6.0–8.5)

## 2016-11-28 LAB — THYROID PANEL WITH TSH
FREE THYROXINE INDEX: 2.5 (ref 1.2–4.9)
T3 Uptake Ratio: 27 % (ref 24–39)
T4 TOTAL: 9.3 ug/dL (ref 4.5–12.0)
TSH: 6.12 u[IU]/mL — AB (ref 0.450–4.500)

## 2016-11-28 LAB — LIPID PANEL
CHOL/HDL RATIO: 2.9 ratio (ref 0.0–4.4)
Cholesterol, Total: 165 mg/dL (ref 100–199)
HDL: 56 mg/dL (ref >39)
LDL Calculated: 96 mg/dL (ref 0–99)
Triglycerides: 65 mg/dL (ref 0–149)
VLDL Cholesterol Cal: 13 mg/dL (ref 5–40)

## 2016-11-29 ENCOUNTER — Other Ambulatory Visit: Payer: Self-pay | Admitting: Nurse Practitioner

## 2016-11-29 MED ORDER — LEVOTHYROXINE SODIUM 75 MCG PO TABS: 75.0000 ug | ORAL_TABLET | Freq: Every day | ORAL | 5 refills | Status: DC

## 2017-06-14 ENCOUNTER — Ambulatory Visit: Payer: 59 | Admitting: Physical Therapy

## 2017-06-20 ENCOUNTER — Other Ambulatory Visit: Payer: Self-pay | Admitting: Nurse Practitioner

## 2017-07-02 ENCOUNTER — Ambulatory Visit (INDEPENDENT_AMBULATORY_CARE_PROVIDER_SITE_OTHER): Payer: Worker's Compensation | Admitting: Nurse Practitioner

## 2017-07-02 ENCOUNTER — Encounter: Payer: Self-pay | Admitting: Nurse Practitioner

## 2017-07-02 VITALS — BP 108/64 | HR 69 | Temp 96.9°F | Ht 64.0 in | Wt 151.0 lb

## 2017-07-02 DIAGNOSIS — Z026 Encounter for examination for insurance purposes: Secondary | ICD-10-CM

## 2017-07-02 DIAGNOSIS — Z09 Encounter for follow-up examination after completed treatment for conditions other than malignant neoplasm: Secondary | ICD-10-CM | POA: Diagnosis not present

## 2017-07-02 DIAGNOSIS — Z029 Encounter for administrative examinations, unspecified: Secondary | ICD-10-CM

## 2017-07-02 DIAGNOSIS — Z8781 Personal history of (healed) traumatic fracture: Secondary | ICD-10-CM

## 2017-07-02 DIAGNOSIS — M25552 Pain in left hip: Secondary | ICD-10-CM | POA: Diagnosis not present

## 2017-07-02 NOTE — Patient Instructions (Signed)

## 2017-07-02 NOTE — Progress Notes (Signed)
   Subjective:    Patient ID: Angela Dean, female    DOB: 07-07-1955, 62 y.o.   MRN: [id number]  HPI Angela Dean comes in today for workers comp injury.  DATE OF INJURY: 06/05/17   EMPLOYER: [employer]  EXPLANATION OF INJURY: Patient went o sit on stool and stool tumbled over and fell to floor. She went to [facility] urgent care and she had fracture of left hip. Was sent to ortho and she ended up having surgery on 06/11/17, and had 3 pins put in hip. When she left hospital they told her she had to have hospital follow up. She is doing well. Still getting  Therapy and is walking with walker. Has some achy pains. Still on oxycodone.    Review of Systems  Constitutional: Negative.   HENT: Negative.   Respiratory: Negative.   Cardiovascular: Negative.   Gastrointestinal: Negative.   Genitourinary: Negative.   Musculoskeletal: Positive for arthralgias (right hip).  Neurological: Negative.   Psychiatric/Behavioral: Negative.   All other systems reviewed and are negative.      Objective:   Physical Exam  Constitutional: She appears well-developed and well-nourished. No distress.  Cardiovascular: Normal rate and regular rhythm.  Pulmonary/Chest: Effort normal and breath sounds normal.  Musculoskeletal:  Decrease ROM of left hip- was told to avoid internal rotation.  Neurological: She is alert.  Skin: Skin is warm.  Left hip incision healing well- no signs of infection  Psychiatric: She has a normal mood and affect. Her behavior is normal. Judgment and thought content normal.   BP 108/64   Pulse 69   Temp (!) 96.9 F (36.1 C) (Oral)   Ht 5\' 4"  (1.626 m)   Wt 151 lb (68.5 kg)   BMI 25.92 kg/m         Assessment & Plan:   1. S/p left hip fracture   2. Encounter related to worker's compensation claim   3. Hospital discharge follow-up    Keep follow up appointment with surgeon- he will release back to work Continue PT Motrin or tylenol OTC instead of pain meds RTO  prn  Mary-Margaret Hassell Done, FNP

## 2017-07-16 ENCOUNTER — Telehealth: Payer: Self-pay | Admitting: Nurse Practitioner

## 2017-07-16 NOTE — Telephone Encounter (Signed)
appt scheduled for rck

## 2017-07-18 ENCOUNTER — Ambulatory Visit: Payer: 59 | Admitting: Nurse Practitioner

## 2017-07-18 ENCOUNTER — Encounter: Payer: Self-pay | Admitting: Nurse Practitioner

## 2017-07-18 VITALS — BP 126/84 | HR 73 | Temp 97.6°F | Ht 64.0 in | Wt 150.0 lb

## 2017-07-18 DIAGNOSIS — E039 Hypothyroidism, unspecified: Secondary | ICD-10-CM

## 2017-07-18 MED ORDER — LEVOTHYROXINE SODIUM 75 MCG PO TABS: 75.0000 ug | ORAL_TABLET | Freq: Every day | ORAL | 5 refills | Status: DC

## 2017-07-18 NOTE — Progress Notes (Signed)
   Subjective:    Patient ID: Angela Dean, female    DOB: 1955/11/07, 62 y.o.   MRN: [id number]  HPI  Patient comes in today for thyroid recheck. She is  Currently on levothyroxin 45mg daily. She says she is feeling well without complaints.   Review of Systems  Constitutional: Negative for activity change and appetite change.  HENT: Negative.   Eyes: Negative for pain.  Respiratory: Negative for shortness of breath.   Cardiovascular: Negative for chest pain, palpitations and leg swelling.  Gastrointestinal: Negative for abdominal pain.  Endocrine: Negative for polydipsia.  Genitourinary: Negative.   Skin: Negative for rash.  Neurological: Negative for dizziness, weakness and headaches.  Hematological: Does not bruise/bleed easily.  Psychiatric/Behavioral: Negative.   All other systems reviewed and are negative.      Objective:   Physical Exam  Constitutional: She is oriented to person, place, and time. She appears well-developed and well-nourished.  HENT:  Nose: Nose normal.  Mouth/Throat: Oropharynx is clear and moist.  Eyes: EOM are normal.  Neck: Trachea normal, normal range of motion and full passive range of motion without pain. Neck supple. No JVD present. Carotid bruit is not present. No thyromegaly present.  Cardiovascular: Normal rate, regular rhythm, normal heart sounds and intact distal pulses. Exam reveals no gallop and no friction rub.  No murmur heard. Pulmonary/Chest: Effort normal and breath sounds normal.  Abdominal: Soft. Bowel sounds are normal. She exhibits no distension and no mass. There is no tenderness.  Musculoskeletal: Normal range of motion.  Lymphadenopathy:    She has no cervical adenopathy.  Neurological: She is alert and oriented to person, place, and time. She has normal reflexes.  Skin: Skin is warm and dry.  Psychiatric: She has a normal mood and affect. Her behavior is normal. Judgment and thought content normal.    BP 126/84   Pulse  73   Temp 97.6 F (36.4 C) (Oral)   Ht '5\' 4"'$  (1.626 m)   Wt 150 lb (68 kg)   BMI 25.75 kg/m        Assessment & Plan:  1. Hypothyroidism, unspecified type - CMP14+EGFR - Lipid panel - Thyroid Panel With TSH    Labs pending Health maintenance reviewed Diet and exercise encouraged Continue all meds Follow up  In 6 months   [place] FNP

## 2017-07-18 NOTE — Patient Instructions (Signed)

## 2017-07-19 ENCOUNTER — Other Ambulatory Visit: Payer: Self-pay | Admitting: Nurse Practitioner

## 2017-07-19 LAB — LIPID PANEL
Chol/HDL Ratio: 2.7 ratio (ref 0.0–4.4)
Cholesterol, Total: 180 mg/dL (ref 100–199)
HDL: 67 mg/dL (ref >39)
LDL CALC: 97 mg/dL (ref 0–99)
Triglycerides: 81 mg/dL (ref 0–149)
VLDL Cholesterol Cal: 16 mg/dL (ref 5–40)

## 2017-07-19 LAB — CMP14+EGFR
ALBUMIN: 4.3 g/dL (ref 3.6–4.8)
ALT: 8 IU/L (ref 0–32)
AST: 16 IU/L (ref 0–40)
Albumin/Globulin Ratio: 1.5 (ref 1.2–2.2)
Alkaline Phosphatase: 73 IU/L (ref 39–117)
BILIRUBIN TOTAL: 0.3 mg/dL (ref 0.0–1.2)
BUN / CREAT RATIO: 18 (ref 12–28)
BUN: 14 mg/dL (ref 8–27)
CHLORIDE: 103 mmol/L (ref 96–106)
CO2: 24 mmol/L (ref 20–29)
Calcium: 9.7 mg/dL (ref 8.7–10.3)
Creatinine, Ser: 0.79 mg/dL (ref 0.57–1.00)
GFR calc non Af Amer: 80 mL/min/{1.73_m2} (ref >59)
GFR, EST AFRICAN AMERICAN: 93 mL/min/{1.73_m2} (ref >59)
GLUCOSE: 85 mg/dL (ref 65–99)
Globulin, Total: 2.9 g/dL (ref 1.5–4.5)
Potassium: 4.7 mmol/L (ref 3.5–5.2)
Sodium: 142 mmol/L (ref 134–144)
Total Protein: 7.2 g/dL (ref 6.0–8.5)

## 2017-07-19 LAB — THYROID PANEL WITH TSH
Free Thyroxine Index: 2.5 (ref 1.2–4.9)
T3 UPTAKE RATIO: 27 % (ref 24–39)
T4 TOTAL: 9.2 ug/dL (ref 4.5–12.0)
TSH: 23.34 u[IU]/mL — AB (ref 0.450–4.500)

## 2017-07-19 MED ORDER — LEVOTHYROXINE SODIUM 100 MCG PO TABS: 100.0000 ug | ORAL_TABLET | Freq: Every day | ORAL | 11 refills | Status: DC

## 2017-07-30 ENCOUNTER — Other Ambulatory Visit: Payer: Self-pay | Admitting: Nurse Practitioner

## 2017-07-30 DIAGNOSIS — Z1231 Encounter for screening mammogram for malignant neoplasm of breast: Secondary | ICD-10-CM

## 2017-09-09 ENCOUNTER — Ambulatory Visit
Admission: RE | Admit: 2017-09-09 | Discharge: 2017-09-09 | Disposition: A | Discharge status: Home / Self Care | Payer: 59 | Source: Ambulatory Visit | Attending: Nurse Practitioner | Admitting: Nurse Practitioner

## 2017-09-09 DIAGNOSIS — Z1231 Encounter for screening mammogram for malignant neoplasm of breast: Secondary | ICD-10-CM

## 2018-06-04 IMAGING — MG DIGITAL SCREENING UNILATERAL RIGHT MAMMOGRAM WITH CAD AND TOMO
4 series · 4 of 12 positions shown · non-contrast
Comparison: Previous exam(s).

CLINICAL DATA: Screening. Prior left mastectomy.

EXAM:
DIGITAL SCREENING UNILATERAL RIGHT MAMMOGRAM WITH CAD AND TOMO

[R CC synth-2D]
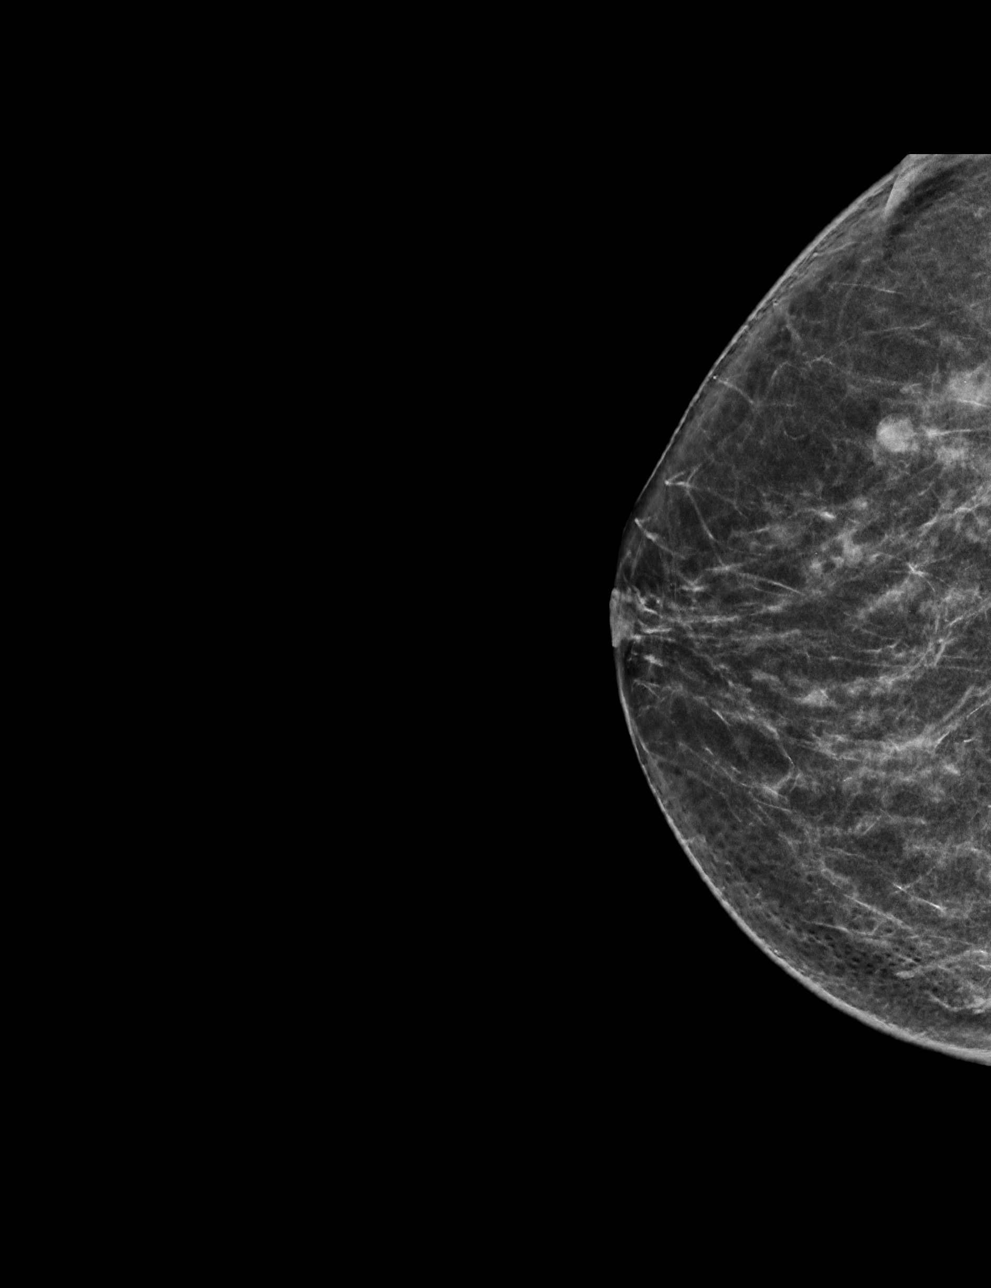

[R MLO synth-2D]
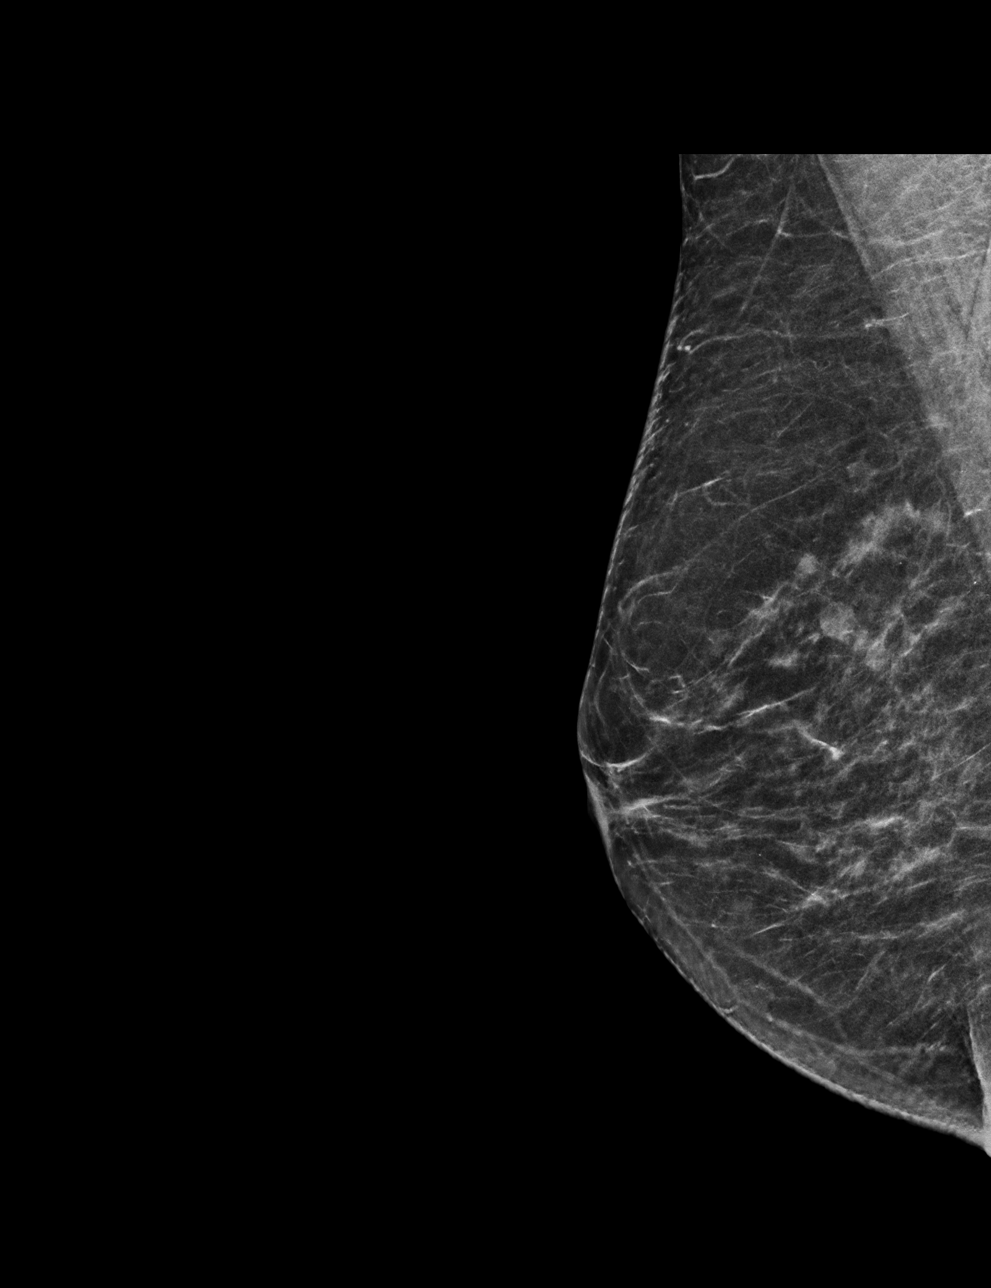

[R MLO tomo · tomo slice 29/56.0]
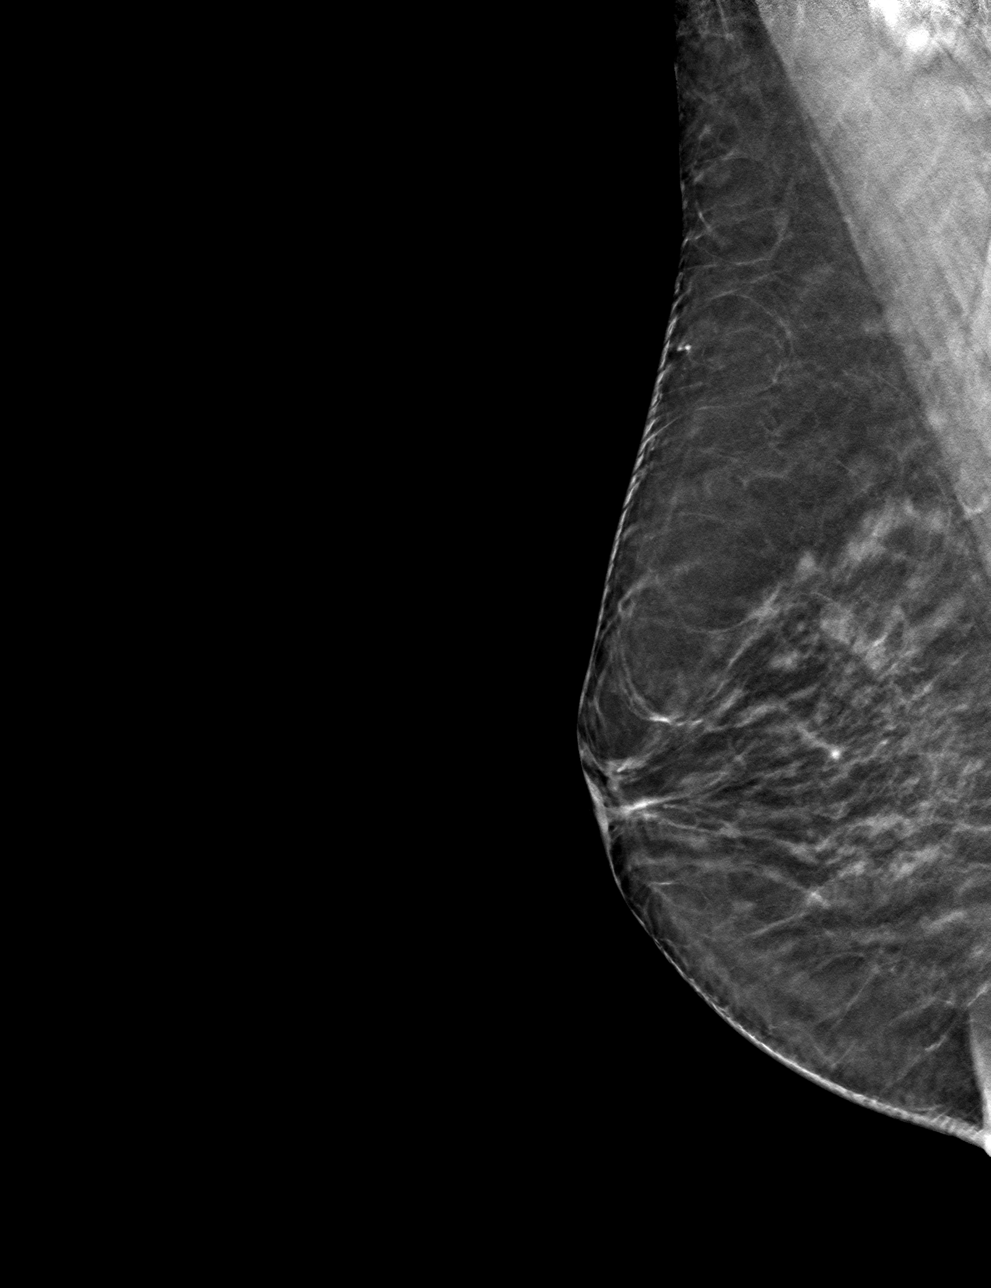

[R CC tomo · tomo slice 31/60.0]
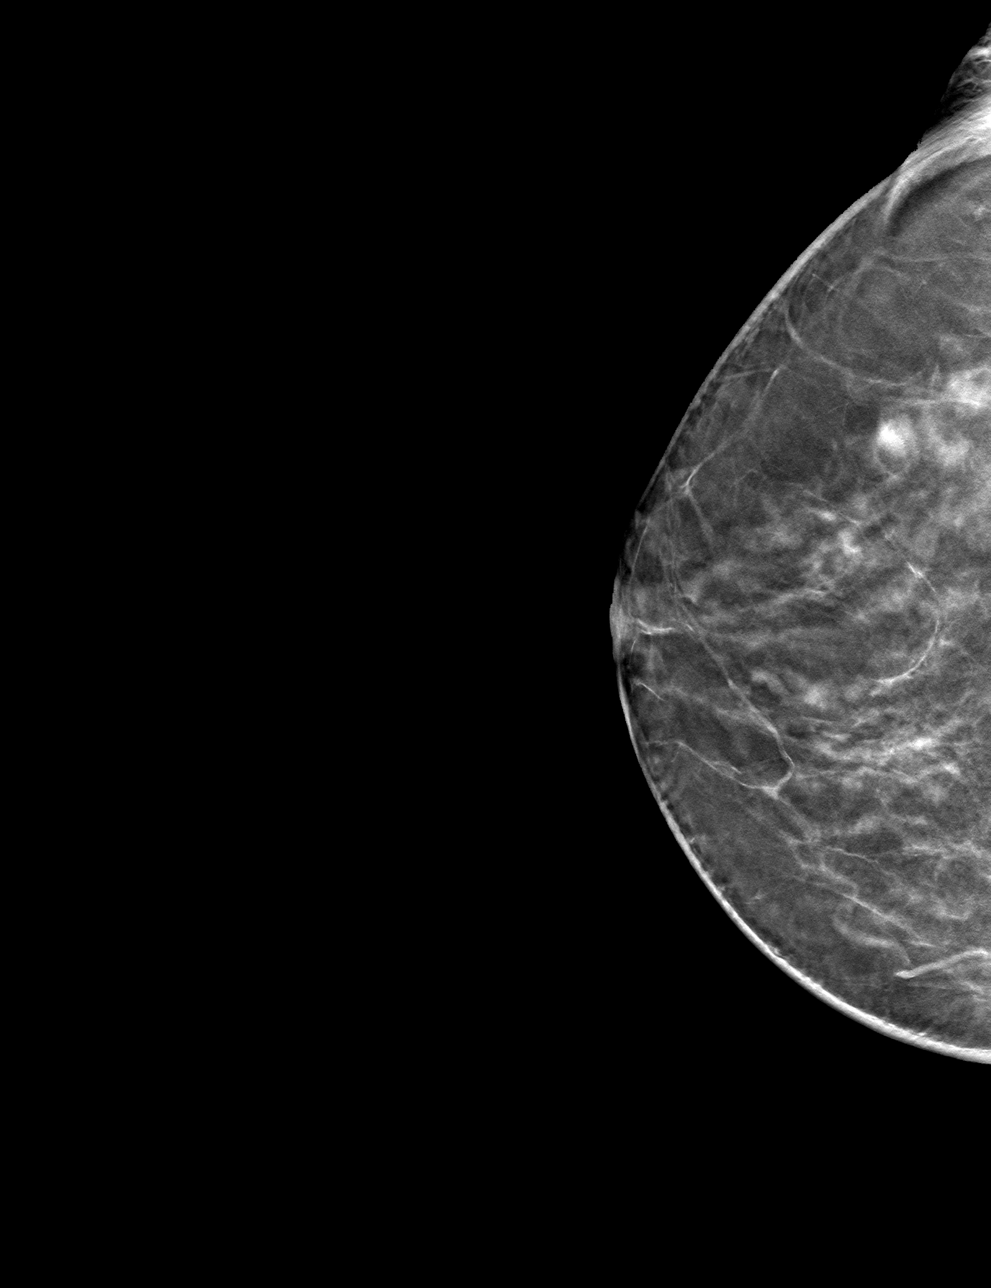

[4 of 12 positions shown; findings below may reference images not displayed]

ACR Breast Density Category b: There are scattered areas of
fibroglandular density.
FINDINGS: There are no findings suspicious for malignancy. Images were
processed with CAD.
IMPRESSION: No mammographic evidence of malignancy. A result letter of this
screening mammogram will be mailed directly to the patient.

RECOMMENDATION:
Screening mammogram in one year. (Code:RD-P-8MP)

BI-RADS CATEGORY  1: Negative.

## 2018-07-22 ENCOUNTER — Other Ambulatory Visit: Payer: Self-pay | Admitting: Nurse Practitioner

## 2018-07-23 NOTE — Telephone Encounter (Signed)
Last thyroid 07/18/17

## 2018-07-23 NOTE — Telephone Encounter (Signed)
Last refill without being seen 

## 2018-08-18 ENCOUNTER — Other Ambulatory Visit: Payer: Self-pay | Admitting: Nurse Practitioner

## 2018-08-18 NOTE — Telephone Encounter (Signed)
lmtcb-cb 2/24 

## 2018-08-18 NOTE — Telephone Encounter (Signed)
MMM. NTBS 30 days given 07/23/18

## 2018-09-04 ENCOUNTER — Ambulatory Visit: Payer: 59 | Admitting: Nurse Practitioner

## 2018-09-04 ENCOUNTER — Other Ambulatory Visit: Payer: Self-pay

## 2018-09-04 ENCOUNTER — Encounter: Payer: Self-pay | Admitting: Nurse Practitioner

## 2018-09-04 VITALS — BP 132/84 | HR 84 | Temp 97.0°F | Ht 64.0 in | Wt 156.0 lb

## 2018-09-04 DIAGNOSIS — E039 Hypothyroidism, unspecified: Secondary | ICD-10-CM | POA: Diagnosis not present

## 2018-09-04 DIAGNOSIS — Z1212 Encounter for screening for malignant neoplasm of rectum: Secondary | ICD-10-CM

## 2018-09-04 DIAGNOSIS — Z1211 Encounter for screening for malignant neoplasm of colon: Secondary | ICD-10-CM

## 2018-09-04 MED ORDER — LEVOTHYROXINE SODIUM 100 MCG PO TABS: 100.0000 ug | ORAL_TABLET | Freq: Every day | ORAL | 1 refills | Status: DC

## 2018-09-04 NOTE — Progress Notes (Signed)
Subjective:    Patient ID: Angela Dean, female    DOB: 04/23/1956, 63 y.o.   MRN: [id number]   Chief Complaint: Medical Management of Chronic Issues   HPI:  1. Acquired hypothyroidism  Doing well- not having any problems that she is aware of.    Outpatient Encounter Medications as of 09/04/2018  Medication Sig  . levothyroxine (SYNTHROID, LEVOTHROID) 100 MCG tablet TAKE 1 TABLET DAILY      New complaints: None today  Social history: She lives by herself- has lots of family close by.   Review of Systems  Constitutional: Negative for activity change and appetite change.  HENT: Negative.   Eyes: Negative for pain.  Respiratory: Negative for shortness of breath.   Cardiovascular: Negative for chest pain, palpitations and leg swelling.  Gastrointestinal: Negative for abdominal pain.  Endocrine: Negative for polydipsia.  Genitourinary: Negative.   Skin: Negative for rash.  Neurological: Negative for dizziness, weakness and headaches.  Hematological: Does not bruise/bleed easily.  Psychiatric/Behavioral: Negative.   All other systems reviewed and are negative.      Objective:   Physical Exam Vitals signs and nursing note reviewed.  Constitutional:      General: She is not in acute distress.    Appearance: Normal appearance. She is well-developed.  HENT:     Head: Normocephalic.     Nose: Nose normal.  Eyes:     Pupils: Pupils are equal, round, and reactive to light.  Neck:     Musculoskeletal: Normal range of motion and neck supple.     Vascular: No carotid bruit or JVD.  Cardiovascular:     Rate and Rhythm: Normal rate and regular rhythm.     Heart sounds: Normal heart sounds.  Pulmonary:     Effort: Pulmonary effort is normal. No respiratory distress.     Breath sounds: Normal breath sounds. No wheezing or rales.  Chest:     Chest wall: No tenderness.  Abdominal:     General: Bowel sounds are normal. There is no distension or abdominal bruit.   Palpations: Abdomen is soft. There is no hepatomegaly, splenomegaly, mass or pulsatile mass.     Tenderness: There is no abdominal tenderness.  Musculoskeletal: Normal range of motion.  Lymphadenopathy:     Cervical: No cervical adenopathy.  Skin:    General: Skin is warm and dry.  Neurological:     Mental Status: She is alert and oriented to person, place, and time.     Deep Tendon Reflexes: Reflexes are normal and symmetric.  Psychiatric:        Behavior: Behavior normal.        Thought Content: Thought content normal.        Judgment: Judgment normal.    BP 132/84   Pulse 84   Temp (!) 97 F (36.1 C) (Oral)   Ht _0  (1.626 m)   Wt 156 lb (70.8 kg)   BMI 26.78 kg/m      Assessment & Plan:  Angela Dean comes in today with chief complaint of Medical Management of Chronic Issues   Diagnosis and orders addressed:  1. Acquired hypothyroidism - CMP14+EGFR - Lipid panel - Thyroid Panel With TSH - levothyroxine (SYNTHROID, LEVOTHROID) 100 MCG tablet; Take 1 tablet (100 mcg total) by mouth daily.  Dispense: 90 tablet; Refill: 1  2. Encounter for colorectal cancer screening - Cologuard   Labs pending Health Maintenance reviewed Diet and exercise encouraged  Follow up plan: 6 months  Mary-Margaret Hassell Done, FNP

## 2018-09-04 NOTE — Patient Instructions (Signed)
  Cologuard  Your provider has prescribed Cologuard, an easy-to-use, noninvasive test for colon cancer screening, based on the latest advances in stool DNA science.   Here's what will happen next:  1. You may receive a call or email from [employer] to confirm your mailing address and insurance information 2. Your kit will be shipped directly to you 3. You collect your stool sample in the privacy of your own home. Please follow the instructions that come with the kit 4. You return the kit via [facility] shipping or pick-up, in the same box it arrived in 5. You should receive a call with the results once they are available. If you do not receive a call, please contact our office at [phone]  Insurance Coverage  Cologuard is covered by Medicare and most major insurers Cologuard is covered by Medicare and Medicare Advantage with no co-pay or deductible for eligible patients ages 19-85. [employer], more than 94% of Cologuard patients have no out-of-pocket cost for screening. . Based on the [place] should be covered by most private insurers with no co-pay or deductible for eligible patients (ages 13-75; at average risk for colon cancer; without symptoms). Currently, ~74% of Cologuard patients 45-49 have had no out-of-pocket cost for screening.  . Many national and regional payers have begun paying for CRC screening at 45. [employer] continues to work with payers to expand coverage and access for patients ages 38-49.   Only your healthcare insurance provider can confirm how Cologuard will be covered for you. If you have questions about coverage, you can contact your insurance company directly or ask the specialists at [employer] to do that for you. A Customer Support Specialist can be reached at [phone].    Patient Support Screening for colon cancer is very important to your good health, so if you have any questions at all, please call [employer], commercial Customer Support Specialists at [phone]. They are available 24 hours a day, 6 days a week. An instructional video is available to view online at [email or web address]   *Information and graphics obtained from [email or web address]

## 2018-09-05 LAB — LIPID PANEL
Chol/HDL Ratio: 2.8 ratio (ref 0.0–4.4)
Cholesterol, Total: 201 mg/dL — ABNORMAL HIGH (ref 100–199)
HDL: 73 mg/dL (ref >39)
LDL Calculated: 115 mg/dL — ABNORMAL HIGH (ref 0–99)
Triglycerides: 63 mg/dL (ref 0–149)
VLDL Cholesterol Cal: 13 mg/dL (ref 5–40)

## 2018-09-05 LAB — CMP14+EGFR
A/G RATIO: 2 (ref 1.2–2.2)
ALBUMIN: 4.6 g/dL (ref 3.8–4.8)
ALT: 36 IU/L — ABNORMAL HIGH (ref 0–32)
AST: 31 IU/L (ref 0–40)
Alkaline Phosphatase: 54 IU/L (ref 39–117)
BILIRUBIN TOTAL: 0.3 mg/dL (ref 0.0–1.2)
BUN/Creatinine Ratio: 13 (ref 12–28)
BUN: 10 mg/dL (ref 8–27)
CALCIUM: 9.7 mg/dL (ref 8.7–10.3)
CHLORIDE: 104 mmol/L (ref 96–106)
CO2: 23 mmol/L (ref 20–29)
Creatinine, Ser: 0.78 mg/dL (ref 0.57–1.00)
GFR calc Af Amer: 94 mL/min/{1.73_m2} (ref >59)
GFR, EST NON AFRICAN AMERICAN: 81 mL/min/{1.73_m2} (ref >59)
Globulin, Total: 2.3 g/dL (ref 1.5–4.5)
Glucose: 65 mg/dL (ref 65–99)
POTASSIUM: 4.6 mmol/L (ref 3.5–5.2)
SODIUM: 144 mmol/L (ref 134–144)
Total Protein: 6.9 g/dL (ref 6.0–8.5)

## 2018-09-05 LAB — THYROID PANEL WITH TSH
FREE THYROXINE INDEX: 3.4 (ref 1.2–4.9)
T3 Uptake Ratio: 29 % (ref 24–39)
T4, Total: 11.7 ug/dL (ref 4.5–12.0)
TSH: 0.712 u[IU]/mL (ref 0.450–4.500)

## 2018-11-27 ENCOUNTER — Other Ambulatory Visit: Payer: Self-pay | Admitting: Nurse Practitioner

## 2018-11-27 DIAGNOSIS — Z1231 Encounter for screening mammogram for malignant neoplasm of breast: Secondary | ICD-10-CM

## 2019-02-25 ENCOUNTER — Ambulatory Visit
Admission: RE | Admit: 2019-02-25 | Discharge: 2019-02-25 | Disposition: A | Discharge status: Home / Self Care | Payer: 59 | Source: Ambulatory Visit | Attending: Nurse Practitioner | Admitting: Nurse Practitioner

## 2019-02-25 ENCOUNTER — Other Ambulatory Visit: Payer: Self-pay

## 2019-02-25 DIAGNOSIS — Z1231 Encounter for screening mammogram for malignant neoplasm of breast: Secondary | ICD-10-CM

## 2019-02-25 HISTORY — DX: Malignant neoplasm of unspecified site of unspecified female breast: C50.919

## 2019-03-03 ENCOUNTER — Other Ambulatory Visit: Payer: Self-pay

## 2019-03-03 ENCOUNTER — Ambulatory Visit: Payer: 59 | Admitting: Nurse Practitioner

## 2019-03-03 ENCOUNTER — Encounter: Payer: Self-pay | Admitting: Nurse Practitioner

## 2019-03-03 VITALS — BP 136/81 | HR 79 | Temp 98.2°F | Ht 64.0 in | Wt 157.2 lb

## 2019-03-03 DIAGNOSIS — E039 Hypothyroidism, unspecified: Secondary | ICD-10-CM | POA: Diagnosis not present

## 2019-03-03 MED ORDER — LEVOTHYROXINE SODIUM 100 MCG PO TABS: 100.0000 ug | ORAL_TABLET | Freq: Every day | ORAL | 1 refills | Status: DC

## 2019-03-03 NOTE — Progress Notes (Signed)
Subjective:    Patient ID: Angela Dean, female    DOB: February 25, 1956, 63 y.o.   MRN: [id number]   Chief Complaint: Medical Management of Chronic Issues    HPI:  1. Acquired hypothyroidism Patient is doing well. denies any fatigue or hairloss. Lab Results  Component Value Date   TSH 0.712 09/04/2018       Outpatient Encounter Medications as of 03/03/2019  Medication Sig  . levothyroxine (SYNTHROID, LEVOTHROID) 100 MCG tablet Take 1 tablet (100 mcg total) by mouth daily.     Past Surgical History:  Procedure Laterality Date  . MASTECTOMY Left 1997    Family History  Problem Relation Age of Onset  . Heart disease Mother   . Hypertension Mother   . Cancer Sister   . Cancer Sister   . Cancer Sister     New complaints: None today  Social history: Lives with her husband  Controlled substance contract: n/a     Review of Systems  Constitutional: Negative for activity change and appetite change.  HENT: Negative.   Eyes: Negative for pain.  Respiratory: Negative for shortness of breath.   Cardiovascular: Negative for chest pain, palpitations and leg swelling.  Gastrointestinal: Negative for abdominal pain.  Endocrine: Negative for polydipsia.  Genitourinary: Negative.   Skin: Negative for rash.  Neurological: Negative for dizziness, weakness and headaches.  Hematological: Does not bruise/bleed easily.  Psychiatric/Behavioral: Negative.   All other systems reviewed and are negative.      Objective:   Physical Exam Vitals signs and nursing note reviewed.  Constitutional:      General: She is not in acute distress.    Appearance: Normal appearance. She is well-developed.  HENT:     Head: Normocephalic.     Nose: Nose normal.  Eyes:     Pupils: Pupils are equal, round, and reactive to light.  Neck:     Musculoskeletal: Normal range of motion and neck supple.     Vascular: No carotid bruit or JVD.  Cardiovascular:     Rate and Rhythm: Normal rate and  regular rhythm.     Heart sounds: Normal heart sounds.  Pulmonary:     Effort: Pulmonary effort is normal. No respiratory distress.     Breath sounds: Normal breath sounds. No wheezing or rales.  Chest:     Chest wall: No tenderness.  Abdominal:     General: Bowel sounds are normal. There is no distension or abdominal bruit.     Palpations: Abdomen is soft. There is no hepatomegaly, splenomegaly, mass or pulsatile mass.     Tenderness: There is no abdominal tenderness.  Musculoskeletal: Normal range of motion.  Lymphadenopathy:     Cervical: No cervical adenopathy.  Skin:    General: Skin is warm and dry.  Neurological:     Mental Status: She is alert and oriented to person, place, and time.     Deep Tendon Reflexes: Reflexes are normal and symmetric.  Psychiatric:        Behavior: Behavior normal.        Thought Content: Thought content normal.        Judgment: Judgment normal.    BP 136/81   Pulse 79   Temp 98.2 F (36.8 C) (Oral)   Ht '5\' 4"'  (1.626 m)   Wt 157 lb 4 oz (71.3 kg)   SpO2 98%   BMI 26.99 kg/m         Assessment & Plan:  LETRICIA KRINSKY comes  in today with chief complaint of Medical Management of Chronic Issues   Diagnosis and orders addressed:  1. Acquired hypothyroidism - CMP14+EGFR - Lipid panel - Thyroid Panel With TSH   Labs pending Health Maintenance reviewed Diet and exercise encouraged  Follow up plan: 6 months   [place], FNP

## 2019-03-03 NOTE — Addendum Note (Signed)
Addended by: Chevis Pretty on: 03/03/2019 11:24 AM   Modules accepted: Orders

## 2019-03-03 NOTE — Patient Instructions (Signed)
Exercising to Stay Healthy To become healthy and stay healthy, it is recommended that you do moderate-intensity and vigorous-intensity exercise. You can tell that you are exercising at a moderate intensity if your heart starts beating faster and you start breathing faster but can still hold a conversation. You can tell that you are exercising at a vigorous intensity if you are breathing much harder and faster and cannot hold a conversation while exercising. Exercising regularly is important. It has many health benefits, such as:  Improving overall fitness, flexibility, and endurance.  Increasing bone density.  Helping with weight control.  Decreasing body fat.  Increasing muscle strength.  Reducing stress and tension.  Improving overall health. How often should I exercise? Choose an activity that you enjoy, and set realistic goals. Your health care provider can help you make an activity plan that works for you. Exercise regularly as told by your health care provider. This may include:  Doing strength training two times a week, such as: ? Lifting weights. ? Using resistance bands. ? Push-ups. ? Sit-ups. ? Yoga.  Doing a certain intensity of exercise for a given amount of time. Choose from these options: ? A total of 150 minutes of moderate-intensity exercise every week. ? A total of 75 minutes of vigorous-intensity exercise every week. ? A mix of moderate-intensity and vigorous-intensity exercise every week. Children, pregnant women, people who have not exercised regularly, people who are overweight, and older adults may need to talk with a health care provider about what activities are safe to do. If you have a medical condition, be sure to talk with your health care provider before you start a new exercise program. What are some exercise ideas? Moderate-intensity exercise ideas include:  Walking 1 mile (1.6 km) in about 15 minutes.  Biking.  Hiking.  Golfing.  Dancing.   Water aerobics. Vigorous-intensity exercise ideas include:  Walking 4.5 miles (7.2 km) or more in about 1 hour.  Jogging or running 5 miles (8 km) in about 1 hour.  Biking 10 miles (16.1 km) or more in about 1 hour.  Lap swimming.  Roller-skating or in-line skating.  Cross-country skiing.  Vigorous competitive sports, such as football, basketball, and soccer.  Jumping rope.  Aerobic dancing. What are some everyday activities that can help me to get exercise?  Yard work, such as: ? Pushing a lawn mower. ? Raking and bagging leaves.  Washing your car.  Pushing a stroller.  Shoveling snow.  Gardening.  Washing windows or floors. How can I be more active in my day-to-day activities?  Use stairs instead of an elevator.  Take a walk during your lunch break.  If you drive, park your car farther away from your work or school.  If you take public transportation, get off one stop early and walk the rest of the way.  Stand up or walk around during all of your indoor phone calls.  Get up, stretch, and walk around every 30 minutes throughout the day.  Enjoy exercise with a friend. Support to continue exercising will help you keep a regular routine of activity. What guidelines can I follow while exercising?  Before you start a new exercise program, talk with your health care provider.  Do not exercise so much that you hurt yourself, feel dizzy, or get very short of breath.  Wear comfortable clothes and wear shoes with good support.  Drink plenty of water while you exercise to prevent dehydration or heat stroke.  Work out until your breathing   and your heartbeat get faster. Where to find more information  [place] Department of Health and Human Services: www.hhs.gov  Centers for Disease Control and Prevention (CDC): www.cdc.gov Summary  Exercising regularly is important. It will improve your overall fitness, flexibility, and endurance.  Regular exercise also will  improve your overall health. It can help you control your weight, reduce stress, and improve your bone density.  Do not exercise so much that you hurt yourself, feel dizzy, or get very short of breath.  Before you start a new exercise program, talk with your health care provider. This information is not intended to replace advice given to you by your health care provider. Make sure you discuss any questions you have with your health care provider. Document Released: 07/14/2010 Document Revised: 05/24/2017 Document Reviewed: 05/02/2017 Elsevier Patient Education  2020 [employer] Inc.  

## 2019-03-04 LAB — CMP14+EGFR
ALT: 28 IU/L (ref 0–32)
AST: 25 IU/L (ref 0–40)
Albumin/Globulin Ratio: 2.3 — ABNORMAL HIGH (ref 1.2–2.2)
Albumin: 4.8 g/dL (ref 3.8–4.8)
Alkaline Phosphatase: 57 IU/L (ref 39–117)
BUN/Creatinine Ratio: 20 (ref 12–28)
BUN: 16 mg/dL (ref 8–27)
Bilirubin Total: 0.4 mg/dL (ref 0.0–1.2)
CO2: 25 mmol/L (ref 20–29)
Calcium: 9.8 mg/dL (ref 8.7–10.3)
Chloride: 101 mmol/L (ref 96–106)
Creatinine, Ser: 0.8 mg/dL (ref 0.57–1.00)
GFR calc Af Amer: 91 mL/min/{1.73_m2} (ref >59)
GFR calc non Af Amer: 79 mL/min/{1.73_m2} (ref >59)
Globulin, Total: 2.1 g/dL (ref 1.5–4.5)
Glucose: 82 mg/dL (ref 65–99)
Potassium: 5.1 mmol/L (ref 3.5–5.2)
Sodium: 143 mmol/L (ref 134–144)
Total Protein: 6.9 g/dL (ref 6.0–8.5)

## 2019-03-04 LAB — LIPID PANEL
Chol/HDL Ratio: 2.9 ratio (ref 0.0–4.4)
Cholesterol, Total: 200 mg/dL — ABNORMAL HIGH (ref 100–199)
HDL: 70 mg/dL (ref >39)
LDL Chol Calc (NIH): 119 mg/dL — ABNORMAL HIGH (ref 0–99)
Triglycerides: 60 mg/dL (ref 0–149)
VLDL Cholesterol Cal: 11 mg/dL (ref 5–40)

## 2019-03-04 LAB — THYROID PANEL WITH TSH
Free Thyroxine Index: 2.7 (ref 1.2–4.9)
T3 Uptake Ratio: 26 % (ref 24–39)
T4, Total: 10.2 ug/dL (ref 4.5–12.0)
TSH: 0.833 u[IU]/mL (ref 0.450–4.500)

## 2019-08-31 ENCOUNTER — Other Ambulatory Visit: Payer: Self-pay

## 2019-09-01 ENCOUNTER — Encounter: Payer: Self-pay | Admitting: Nurse Practitioner

## 2019-09-01 ENCOUNTER — Ambulatory Visit (INDEPENDENT_AMBULATORY_CARE_PROVIDER_SITE_OTHER): Payer: 59 | Admitting: Nurse Practitioner

## 2019-09-01 DIAGNOSIS — E039 Hypothyroidism, unspecified: Secondary | ICD-10-CM | POA: Diagnosis not present

## 2019-09-01 DIAGNOSIS — E7800 Pure hypercholesterolemia, unspecified: Secondary | ICD-10-CM

## 2019-09-01 DIAGNOSIS — E785 Hyperlipidemia, unspecified: Secondary | ICD-10-CM | POA: Insufficient documentation

## 2019-09-01 MED ORDER — LEVOTHYROXINE SODIUM 100 MCG PO TABS: 100.0000 ug | ORAL_TABLET | Freq: Every day | ORAL | 1 refills | Status: DC

## 2019-09-01 NOTE — Progress Notes (Signed)
Virtual Visit via telephone Note Due to COVID-19 pandemic this visit was conducted virtually. This visit type was conducted due to national recommendations for restrictions regarding the COVID-19 Pandemic (e.g. social distancing, sheltering in place) in an effort to limit this patient's exposure and mitigate transmission in our community. All issues noted in this document were discussed and addressed.  A physical exam was not performed with this format.  I connected with Angela Dean on 09/01/19 at 10:05 by telephone and verified that I am speaking with the correct person using two identifiers. Angela Dean is currently located at home and no one is currently with her during visit. The provider, Mary-Margaret Hassell Done, FNP is located in their office at time of visit.  I discussed the limitations, risks, security and privacy concerns of performing an evaluation and management service by telephone and the availability of in person appointments. I also discussed with the patient that there may be a patient responsible charge related to this service. The patient expressed understanding and agreed to proceed.   History and Present Illness:    Chief Complaint: Hypothyroidism    HPI:  1. Pure hypercholesterolemia Does try to watch diet and does do exercise some. Lab Results  Component Value Date   CHOL 200 (H) 03/03/2019   HDL 70 03/03/2019   LDLCALC 119 (H) 03/03/2019   TRIG 60 03/03/2019   CHOLHDL 2.9 03/03/2019     2. Acquired hypothyroidism No problems that she is aware of. Lab Results  Component Value Date   TSH 0.833 03/03/2019       Outpatient Encounter Medications as of 09/01/2019  Medication Sig  . levothyroxine (SYNTHROID) 100 MCG tablet Take 1 tablet (100 mcg total) by mouth daily.    Past Surgical History:  Procedure Laterality Date  . MASTECTOMY Left 1997    Family History  Problem Relation Age of Onset  . Heart disease Mother   . Hypertension Mother   .  Cancer Sister   . Cancer Sister   . Cancer Sister     New complaints: None today  Social history: Lives with her husband  Controlled substance contract: n/a    Review of Systems  Constitutional: Negative for diaphoresis and weight loss.  Eyes: Negative for blurred vision, double vision and pain.  Respiratory: Negative for shortness of breath.   Cardiovascular: Negative for chest pain, palpitations, orthopnea and leg swelling.  Gastrointestinal: Negative for abdominal pain.  Skin: Negative for rash.  Neurological: Negative for dizziness, sensory change, loss of consciousness, weakness and headaches.  Endo/Heme/Allergies: Negative for polydipsia. Does not bruise/bleed easily.  Psychiatric/Behavioral: Negative for memory loss. The patient does not have insomnia.   All other systems reviewed and are negative.    Observations/Objective: Alert and oriented- answers all questions appropriately No distress    Assessment and Plan: Angela Dean comes in today with chief complaint of Hypothyroidism   Diagnosis and orders addressed:  1. Pure hypercholesterolemia Low fta diet and exercise discussed - Lipid panel; Future  2. Acquired hypothyroidism - CMP14+EGFR; Future - CBC with Differential/Platelet; Future - Thyroid Panel With TSH; Future - levothyroxine (SYNTHROID) 100 MCG tablet; Take 1 tablet (100 mcg total) by mouth daily.  Dispense: 90 tablet; Refill: 1   Labs pending- patient will come to the office for lab work next week Health Maintenance reviewed Diet and exercise encouraged  Follow up plan: 6 months     I discussed the assessment and treatment plan with the patient. The patient  was provided an opportunity to ask questions and all were answered. The patient agreed with the plan and demonstrated an understanding of the instructions.   The patient was advised to call back or seek an in-person evaluation if the symptoms worsen or if the condition fails to  improve as anticipated.  The above assessment and management plan was discussed with the patient. The patient verbalized understanding of and has agreed to the management plan. Patient is aware to call the clinic if symptoms persist or worsen. Patient is aware when to return to the clinic for a follow-up visit. Patient educated on when it is appropriate to go to the emergency department.   Time call ended:  10:15 I provided 10 minutes of non-face-to-face time during this encounter.    Mary-Margaret Hassell Done, FNP

## 2019-09-04 ENCOUNTER — Other Ambulatory Visit: Payer: Self-pay

## 2019-09-04 ENCOUNTER — Other Ambulatory Visit: Payer: 59

## 2019-09-04 DIAGNOSIS — E7800 Pure hypercholesterolemia, unspecified: Secondary | ICD-10-CM

## 2019-09-04 DIAGNOSIS — E039 Hypothyroidism, unspecified: Secondary | ICD-10-CM

## 2019-09-05 LAB — LIPID PANEL
Chol/HDL Ratio: 2.7 ratio (ref 0.0–4.4)
Cholesterol, Total: 199 mg/dL (ref 100–199)
HDL: 73 mg/dL (ref >39)
LDL Chol Calc (NIH): 111 mg/dL — ABNORMAL HIGH (ref 0–99)
Triglycerides: 82 mg/dL (ref 0–149)
VLDL Cholesterol Cal: 15 mg/dL (ref 5–40)

## 2019-09-05 LAB — CMP14+EGFR
ALT: 18 IU/L (ref 0–32)
AST: 20 IU/L (ref 0–40)
Albumin/Globulin Ratio: 2 (ref 1.2–2.2)
Albumin: 4.5 g/dL (ref 3.8–4.8)
Alkaline Phosphatase: 53 IU/L (ref 39–117)
BUN/Creatinine Ratio: 19 (ref 12–28)
BUN: 15 mg/dL (ref 8–27)
Bilirubin Total: 0.6 mg/dL (ref 0.0–1.2)
CO2: 21 mmol/L (ref 20–29)
Calcium: 9.7 mg/dL (ref 8.7–10.3)
Chloride: 105 mmol/L (ref 96–106)
Creatinine, Ser: 0.79 mg/dL (ref 0.57–1.00)
GFR calc Af Amer: 91 mL/min/{1.73_m2} (ref >59)
GFR calc non Af Amer: 79 mL/min/{1.73_m2} (ref >59)
Globulin, Total: 2.2 g/dL (ref 1.5–4.5)
Glucose: 85 mg/dL (ref 65–99)
Potassium: 4.3 mmol/L (ref 3.5–5.2)
Sodium: 141 mmol/L (ref 134–144)
Total Protein: 6.7 g/dL (ref 6.0–8.5)

## 2019-09-05 LAB — CBC WITH DIFFERENTIAL/PLATELET
Basophils Absolute: 0 10*3/uL (ref 0.0–0.2)
Basos: 0 %
EOS (ABSOLUTE): 0.1 10*3/uL (ref 0.0–0.4)
Eos: 1 %
Hematocrit: 41.7 % (ref 34.0–46.6)
Hemoglobin: 13.7 g/dL (ref 11.1–15.9)
Immature Grans (Abs): 0 10*3/uL (ref 0.0–0.1)
Immature Granulocytes: 0 %
Lymphocytes Absolute: 2 10*3/uL (ref 0.7–3.1)
Lymphs: 26 %
MCH: 29.2 pg (ref 26.6–33.0)
MCHC: 32.9 g/dL (ref 31.5–35.7)
MCV: 89 fL (ref 79–97)
Monocytes Absolute: 0.5 10*3/uL (ref 0.1–0.9)
Monocytes: 6 %
Neutrophils Absolute: 5.2 10*3/uL (ref 1.4–7.0)
Neutrophils: 67 %
Platelets: 233 10*3/uL (ref 150–450)
RBC: 4.69 x10E6/uL (ref 3.77–5.28)
RDW: 13.9 % (ref 11.7–15.4)
WBC: 7.8 10*3/uL (ref 3.4–10.8)

## 2019-09-05 LAB — THYROID PANEL WITH TSH
Free Thyroxine Index: 3.5 (ref 1.2–4.9)
T3 Uptake Ratio: 31 % (ref 24–39)
T4, Total: 11.3 ug/dL (ref 4.5–12.0)
TSH: 0.36 u[IU]/mL — ABNORMAL LOW (ref 0.450–4.500)

## 2019-09-08 ENCOUNTER — Other Ambulatory Visit: Payer: Self-pay | Admitting: *Deleted

## 2019-09-08 ENCOUNTER — Telehealth: Payer: Self-pay | Admitting: Nurse Practitioner

## 2019-09-08 DIAGNOSIS — E039 Hypothyroidism, unspecified: Secondary | ICD-10-CM

## 2019-09-08 NOTE — Telephone Encounter (Signed)
Aware of provider's advice and future TSH ordered.

## 2019-09-08 NOTE — Telephone Encounter (Signed)
Labs notes in pools- says to hold synthroid on wednesdays- repeat labs in 6 weeks

## 2019-09-08 NOTE — Telephone Encounter (Signed)
Pt called wanting to know if MMM can review her lab results to determine whether she is going to change the dosage on her Thyroid medication. Pt says she has a Rx already at the pharmacy for 100mg  but doesn't want to pick that up if MMM is going to change the dosage. Says she will run out of her medication soon.

## 2019-09-08 NOTE — Telephone Encounter (Signed)
Referral made 

## 2019-09-11 ENCOUNTER — Other Ambulatory Visit: Payer: Self-pay

## 2019-09-11 DIAGNOSIS — E039 Hypothyroidism, unspecified: Secondary | ICD-10-CM

## 2020-02-13 ENCOUNTER — Other Ambulatory Visit: Payer: Self-pay | Admitting: Nurse Practitioner

## 2020-02-13 DIAGNOSIS — E039 Hypothyroidism, unspecified: Secondary | ICD-10-CM

## 2020-03-22 ENCOUNTER — Other Ambulatory Visit: Payer: Self-pay | Admitting: Nurse Practitioner

## 2020-03-22 DIAGNOSIS — Z1231 Encounter for screening mammogram for malignant neoplasm of breast: Secondary | ICD-10-CM

## 2020-05-05 ENCOUNTER — Ambulatory Visit: Payer: 59

## 2020-05-05 ENCOUNTER — Ambulatory Visit
Admission: RE | Admit: 2020-05-05 | Discharge: 2020-05-05 | Disposition: A | Discharge status: Home / Self Care | Payer: 59 | Source: Ambulatory Visit | Attending: Nurse Practitioner | Admitting: Nurse Practitioner

## 2020-05-05 ENCOUNTER — Other Ambulatory Visit: Payer: Self-pay

## 2020-05-05 DIAGNOSIS — Z1231 Encounter for screening mammogram for malignant neoplasm of breast: Secondary | ICD-10-CM

## 2020-05-24 ENCOUNTER — Other Ambulatory Visit: Payer: Self-pay | Admitting: Nurse Practitioner

## 2020-05-24 DIAGNOSIS — E039 Hypothyroidism, unspecified: Secondary | ICD-10-CM

## 2020-08-05 ENCOUNTER — Other Ambulatory Visit: Payer: Self-pay | Admitting: Nurse Practitioner

## 2020-08-05 DIAGNOSIS — E039 Hypothyroidism, unspecified: Secondary | ICD-10-CM

## 2020-08-23 ENCOUNTER — Encounter: Payer: Self-pay | Admitting: Nurse Practitioner

## 2020-08-23 ENCOUNTER — Other Ambulatory Visit: Payer: Self-pay

## 2020-08-23 ENCOUNTER — Ambulatory Visit: Payer: 59 | Admitting: Nurse Practitioner

## 2020-08-23 VITALS — BP 102/66 | HR 78 | Temp 98.2°F | Resp 20 | Ht 64.0 in | Wt 156.0 lb

## 2020-08-23 DIAGNOSIS — Z1211 Encounter for screening for malignant neoplasm of colon: Secondary | ICD-10-CM | POA: Diagnosis not present

## 2020-08-23 DIAGNOSIS — Z1212 Encounter for screening for malignant neoplasm of rectum: Secondary | ICD-10-CM

## 2020-08-23 DIAGNOSIS — Z13820 Encounter for screening for osteoporosis: Secondary | ICD-10-CM

## 2020-08-23 DIAGNOSIS — E7800 Pure hypercholesterolemia, unspecified: Secondary | ICD-10-CM | POA: Diagnosis not present

## 2020-08-23 DIAGNOSIS — E039 Hypothyroidism, unspecified: Secondary | ICD-10-CM

## 2020-08-23 DIAGNOSIS — I4819 Other persistent atrial fibrillation: Secondary | ICD-10-CM

## 2020-08-23 MED ORDER — LEVOTHYROXINE SODIUM 100 MCG PO TABS: 100.0000 ug | ORAL_TABLET | Freq: Every day | ORAL | 0 refills | Status: DC

## 2020-08-23 NOTE — Patient Instructions (Signed)

## 2020-08-23 NOTE — Progress Notes (Signed)
Subjective:    Patient ID: Angela Dean, female    DOB: 1956/03/09, 65 y.o.   MRN: [id number]   Chief Complaint: Medical Management of Chronic Issues    HPI:  1. Pure hypercholesterolemia Is on no medication. Does try to watch diet but does no dedicated exercise. Lab Results  Component Value Date   CHOL 199 09/04/2019   HDL 73 09/04/2019   LDLCALC 111 (H) 09/04/2019   TRIG 82 09/04/2019   CHOLHDL 2.7 09/04/2019   The 10-year ASCVD risk score Mikey Bussing DC Jr., et al., 2013) is: 3.2%   2. Acquired hypothyroidism No problems that she is aware of. Lab Results  Component Value Date   TSH 0.360 (L) 09/04/2019       Outpatient Encounter Medications as of 08/23/2020  Medication Sig  . levothyroxine (SYNTHROID) 100 MCG tablet Take 1 tablet (100 mcg total) by mouth daily. (needs labwork)   No facility-administered encounter medications on file as of 08/23/2020.    Past Surgical History:  Procedure Laterality Date  . MASTECTOMY Left 1997    Family History  Problem Relation Age of Onset  . Heart disease Mother   . Hypertension Mother   . Cancer Sister   . Cancer Sister   . Cancer Sister     New complaints: None today  Social history: Lives with her husband  Controlled substance contract: n/a    Review of Systems  Constitutional: Negative for diaphoresis.  Eyes: Negative for pain.  Respiratory: Negative for shortness of breath.   Cardiovascular: Negative for chest pain, palpitations and leg swelling.  Gastrointestinal: Negative for abdominal pain.  Endocrine: Negative for polydipsia.  Skin: Negative for rash.  Neurological: Negative for dizziness, weakness and headaches.  Hematological: Does not bruise/bleed easily.  All other systems reviewed and are negative.      Objective:   Physical Exam Vitals and nursing note reviewed.  Constitutional:      General: She is not in acute distress.    Appearance: Normal appearance. She is well-developed and  well-nourished.  HENT:     Head: Normocephalic.     Nose: Nose normal.     Mouth/Throat:     Mouth: Oropharynx is clear and moist.  Eyes:     Extraocular Movements: EOM normal.     Pupils: Pupils are equal, round, and reactive to light.  Neck:     Vascular: No carotid bruit or JVD.  Cardiovascular:     Rate and Rhythm: Normal rate. Rhythm irregular.     Pulses: Intact distal pulses.     Heart sounds: Normal heart sounds.  Pulmonary:     Effort: Pulmonary effort is normal. No respiratory distress.     Breath sounds: Normal breath sounds. No wheezing or rales.  Chest:     Chest wall: No tenderness.  Abdominal:     General: Bowel sounds are normal. There is no distension or abdominal bruit. Aorta is normal.     Palpations: Abdomen is soft. There is no hepatomegaly, splenomegaly, mass or pulsatile mass.     Tenderness: There is no abdominal tenderness.  Musculoskeletal:        General: No edema. Normal range of motion.     Cervical back: Normal range of motion and neck supple.  Lymphadenopathy:     Cervical: No cervical adenopathy.  Skin:    General: Skin is warm and dry.  Neurological:     Mental Status: She is alert and oriented to person, place, and time.  Deep Tendon Reflexes: Reflexes are normal and symmetric.  Psychiatric:        Mood and Affect: Mood and affect normal.        Behavior: Behavior normal.        Thought Content: Thought content normal.        Judgment: Judgment normal.     BP 102/66   Pulse 78   Temp 98.2 F (36.8 C) (Temporal)   Resp 20   Ht '5\' 4"'  (1.626 m)   Wt 156 lb (70.8 kg)   SpO2 98%   BMI 26.78 kg/m   EKG- Atrial fib-Mary-Margaret Hassell Done, FNP  CHF- 0 HTN- 0 Age>70- 0 Diabetes- 0 Hx stroke (2)- 0 Vascular disease-0 Age >43- 0 Sex- female- 1 TOTAL - 1       Assessment & Plan:  [name] comes in today with chief complaint of Medical Management of Chronic Issues   Diagnosis and orders addressed:  1. Pure  hypercholesterolemia Low fat diet - CBC with Differential/Platelet - CMP14+EGFR - Lipid panel - EKG 12-Lead  2. Acquired hypothyroidism Labs pennding - levothyroxine (SYNTHROID) 100 MCG tablet; Take 1 tablet (100 mcg total) by mouth daily. (needs labwork)  Dispense: 30 tablet; Refill: 0 - Thyroid Panel With TSH  3. Persistent atrial fibrillation ([place]) New onset [place] core- 1 Start baby aspirin daily  Orders Placed This Encounter  Procedures  . DG [facility] DEXA    Order Specific Question:   Reason for Exam (SYMPTOM  OR DIAGNOSIS REQUIRED)    Answer:   screening  . CBC with Differential/Platelet  . CMP14+EGFR  . Lipid panel  . Thyroid Panel With TSH  . Cologuard  . EKG 12-Lead    Labs pending Health Maintenance reviewed Diet and exercise encouraged  Follow up plan: 3 months   Mary-Margaret Hassell Done, FNP

## 2020-08-24 LAB — CBC WITH DIFFERENTIAL/PLATELET
Basophils Absolute: 0 10*3/uL (ref 0.0–0.2)
Basos: 1 %
EOS (ABSOLUTE): 0.1 10*3/uL (ref 0.0–0.4)
Eos: 1 %
Hematocrit: 43.7 % (ref 34.0–46.6)
Hemoglobin: 14.9 g/dL (ref 11.1–15.9)
Immature Grans (Abs): 0 10*3/uL (ref 0.0–0.1)
Immature Granulocytes: 0 %
Lymphocytes Absolute: 2.1 10*3/uL (ref 0.7–3.1)
Lymphs: 30 %
MCH: 29.6 pg (ref 26.6–33.0)
MCHC: 34.1 g/dL (ref 31.5–35.7)
MCV: 87 fL (ref 79–97)
Monocytes Absolute: 0.6 10*3/uL (ref 0.1–0.9)
Monocytes: 8 %
Neutrophils Absolute: 4.2 10*3/uL (ref 1.4–7.0)
Neutrophils: 60 %
Platelets: 224 10*3/uL (ref 150–450)
RBC: 5.03 x10E6/uL (ref 3.77–5.28)
RDW: 13.1 % (ref 11.7–15.4)
WBC: 7 10*3/uL (ref 3.4–10.8)

## 2020-08-24 LAB — CMP14+EGFR
ALT: 14 IU/L (ref 0–32)
AST: 16 IU/L (ref 0–40)
Albumin/Globulin Ratio: 1.8 (ref 1.2–2.2)
Albumin: 4.4 g/dL (ref 3.8–4.8)
Alkaline Phosphatase: 50 IU/L (ref 44–121)
BUN/Creatinine Ratio: 12 (ref 12–28)
BUN: 10 mg/dL (ref 8–27)
Bilirubin Total: 0.5 mg/dL (ref 0.0–1.2)
CO2: 21 mmol/L (ref 20–29)
Calcium: 9.6 mg/dL (ref 8.7–10.3)
Chloride: 104 mmol/L (ref 96–106)
Creatinine, Ser: 0.85 mg/dL (ref 0.57–1.00)
Globulin, Total: 2.5 g/dL (ref 1.5–4.5)
Glucose: 97 mg/dL (ref 65–99)
Potassium: 4.9 mmol/L (ref 3.5–5.2)
Sodium: 140 mmol/L (ref 134–144)
Total Protein: 6.9 g/dL (ref 6.0–8.5)
eGFR: 76 mL/min/{1.73_m2} (ref >59)

## 2020-08-24 LAB — THYROID PANEL WITH TSH
Free Thyroxine Index: 2.8 (ref 1.2–4.9)
T3 Uptake Ratio: 27 % (ref 24–39)
T4, Total: 10.2 ug/dL (ref 4.5–12.0)
TSH: 3.64 u[IU]/mL (ref 0.450–4.500)

## 2020-08-24 LAB — LIPID PANEL
Chol/HDL Ratio: 2.8 ratio (ref 0.0–4.4)
Cholesterol, Total: 197 mg/dL (ref 100–199)
HDL: 71 mg/dL (ref >39)
LDL Chol Calc (NIH): 114 mg/dL — ABNORMAL HIGH (ref 0–99)
Triglycerides: 67 mg/dL (ref 0–149)
VLDL Cholesterol Cal: 12 mg/dL (ref 5–40)

## 2020-09-20 ENCOUNTER — Other Ambulatory Visit: Payer: Self-pay | Admitting: Nurse Practitioner

## 2020-09-20 DIAGNOSIS — E039 Hypothyroidism, unspecified: Secondary | ICD-10-CM

## 2020-11-28 ENCOUNTER — Ambulatory Visit: Payer: Self-pay | Admitting: Nurse Practitioner

## 2020-11-29 ENCOUNTER — Encounter: Payer: Self-pay | Admitting: Nurse Practitioner

## 2020-11-29 ENCOUNTER — Ambulatory Visit: Payer: 59 | Admitting: Nurse Practitioner

## 2020-11-29 ENCOUNTER — Other Ambulatory Visit: Payer: Self-pay

## 2020-11-29 VITALS — BP 110/82 | HR 117 | Temp 97.8°F | Resp 20 | Ht 64.0 in | Wt 160.0 lb

## 2020-11-29 DIAGNOSIS — E7800 Pure hypercholesterolemia, unspecified: Secondary | ICD-10-CM | POA: Diagnosis not present

## 2020-11-29 DIAGNOSIS — I4819 Other persistent atrial fibrillation: Secondary | ICD-10-CM | POA: Diagnosis not present

## 2020-11-29 DIAGNOSIS — E039 Hypothyroidism, unspecified: Secondary | ICD-10-CM | POA: Diagnosis not present

## 2020-11-29 MED ORDER — METOPROLOL SUCCINATE ER 25 MG PO TB24: 25.0000 mg | ORAL_TABLET | Freq: Every day | ORAL | 1 refills | Status: DC

## 2020-11-29 NOTE — Addendum Note (Signed)
Addended by: Chevis Pretty on: 11/29/2020 11:08 AM   Modules accepted: Orders

## 2020-11-29 NOTE — Patient Instructions (Signed)

## 2020-11-29 NOTE — Progress Notes (Signed)
Subjective:    Patient ID: Angela Dean, female    DOB: 04/25/1956, 65 y.o.   MRN: [id number]   Chief Complaint: medical management of chronic issues     HPI:  1. Pure hypercholesterolemia Patient tries to watch diet and stays very active. She is not on any statins at this time Lab Results  Component Value Date   CHOL 197 08/23/2020   HDL 71 08/23/2020   LDLCALC 114 (H) 08/23/2020   TRIG 67 08/23/2020   CHOLHDL 2.8 08/23/2020   The 10-year ASCVD risk score Mikey Bussing DC Jr., et al., 2013) is: 3.2%   2. Acquired hypothyroidism She is having no problems that she is aware of.  Lab Results  Component Value Date   TSH 3.640 08/23/2020   3. Atrial fib New onset at last visit. [place] score was 1 so we started her on baby aspirin. Heart rate is up so not good rate control.    Outpatient Encounter Medications as of 11/29/2020  Medication Sig  . levothyroxine (SYNTHROID) 100 MCG tablet Take 1 tablet (100 mcg total) by mouth daily before breakfast.   No facility-administered encounter medications on file as of 11/29/2020.    Past Surgical History:  Procedure Laterality Date  . MASTECTOMY Left 1997    Family History  Problem Relation Age of Onset  . Heart disease Mother   . Hypertension Mother   . Cancer Sister   . Cancer Sister   . Cancer Sister     New complaints: None today  Social history: Lives with her husband  Controlled substance contract: n/a    Review of Systems  Constitutional: Negative for diaphoresis.  Eyes: Negative for pain.  Respiratory: Negative for shortness of breath.   Cardiovascular: Negative for chest pain, palpitations and leg swelling.  Gastrointestinal: Negative for abdominal pain.  Endocrine: Negative for polydipsia.  Skin: Negative for rash.  Neurological: Negative for dizziness, weakness and headaches.  Hematological: Does not bruise/bleed easily.  All other systems reviewed and are negative.      Objective:   Physical  Exam Vitals and nursing note reviewed.  Constitutional:      General: She is not in acute distress.    Appearance: Normal appearance. She is well-developed.  HENT:     Head: Normocephalic.     Nose: Nose normal.  Eyes:     Pupils: Pupils are equal, round, and reactive to light.  Neck:     Vascular: No carotid bruit or JVD.  Cardiovascular:     Rate and Rhythm: Normal rate. Rhythm irregular.     Heart sounds: Normal heart sounds.  Pulmonary:     Effort: Pulmonary effort is normal. No respiratory distress.     Breath sounds: Normal breath sounds. No wheezing or rales.  Chest:     Chest wall: No tenderness.  Abdominal:     General: Bowel sounds are normal. There is no distension or abdominal bruit.     Palpations: Abdomen is soft. There is no hepatomegaly, splenomegaly, mass or pulsatile mass.     Tenderness: There is no abdominal tenderness.  Musculoskeletal:        General: Normal range of motion.     Cervical back: Normal range of motion and neck supple.  Lymphadenopathy:     Cervical: No cervical adenopathy.  Skin:    General: Skin is warm and dry.  Neurological:     Mental Status: She is alert and oriented to person, place, and time.  Deep Tendon Reflexes: Reflexes are normal and symmetric.  Psychiatric:        Behavior: Behavior normal.        Thought Content: Thought content normal.        Judgment: Judgment normal.    BP 110/82   Pulse (!) 117   Temp 97.8 F (36.6 C) (Temporal)   Resp 20   Ht _0  (1.626 m)   Wt 160 lb (72.6 kg)   SpO2 96%   BMI 27.46 kg/m         Assessment & Plan:  MARGIA WIESEN comes in today with chief complaint of Medical Management of Chronic Issues   Diagnosis and orders addressed:  1. Pure hypercholesterolemia watchs fats in diet - CBC with Differential/Platelet - CMP14+EGFR - Lipid panel  2. Acquired hypothyroidism Labs pending - Thyroid Panel With TSH  3. Persistent atrial fibrillation ([facility]) Added  metoprolol Referral to cardioplogy - aspirin EC 81 MG tablet; Take 81 mg by mouth daily. Swallow whole. - Ambulatory referral to Cardiology - metoprolol succinate (TOPROL-XL) 25 MG 24 hr tablet; Take 1 tablet (25 mg total) by mouth daily.  Dispense: 90 tablet; Refill: 1   Labs pending Health Maintenance reviewed Diet and exercise encouraged  Follow up plan: 3 months   Mary-Margaret Hassell Done, FNP

## 2020-11-30 LAB — LIPID PANEL
Chol/HDL Ratio: 2.6 ratio (ref 0.0–4.4)
Cholesterol, Total: 201 mg/dL — ABNORMAL HIGH (ref 100–199)
HDL: 76 mg/dL (ref >39)
LDL Chol Calc (NIH): 112 mg/dL — ABNORMAL HIGH (ref 0–99)
Triglycerides: 72 mg/dL (ref 0–149)
VLDL Cholesterol Cal: 13 mg/dL (ref 5–40)

## 2020-11-30 LAB — CMP14+EGFR
ALT: 15 IU/L (ref 0–32)
AST: 17 IU/L (ref 0–40)
Albumin/Globulin Ratio: 2 (ref 1.2–2.2)
Albumin: 4.5 g/dL (ref 3.8–4.8)
Alkaline Phosphatase: 50 IU/L (ref 44–121)
BUN/Creatinine Ratio: 13 (ref 12–28)
BUN: 12 mg/dL (ref 8–27)
Bilirubin Total: 0.7 mg/dL (ref 0.0–1.2)
CO2: 23 mmol/L (ref 20–29)
Calcium: 10 mg/dL (ref 8.7–10.3)
Chloride: 102 mmol/L (ref 96–106)
Creatinine, Ser: 0.93 mg/dL (ref 0.57–1.00)
Globulin, Total: 2.3 g/dL (ref 1.5–4.5)
Glucose: 87 mg/dL (ref 65–99)
Potassium: 4.9 mmol/L (ref 3.5–5.2)
Sodium: 139 mmol/L (ref 134–144)
Total Protein: 6.8 g/dL (ref 6.0–8.5)
eGFR: 68 mL/min/{1.73_m2} (ref >59)

## 2020-11-30 LAB — CBC WITH DIFFERENTIAL/PLATELET
Basophils Absolute: 0 10*3/uL (ref 0.0–0.2)
Basos: 1 %
EOS (ABSOLUTE): 0.1 10*3/uL (ref 0.0–0.4)
Eos: 2 %
Hematocrit: 44.2 % (ref 34.0–46.6)
Hemoglobin: 14.6 g/dL (ref 11.1–15.9)
Immature Grans (Abs): 0 10*3/uL (ref 0.0–0.1)
Immature Granulocytes: 0 %
Lymphocytes Absolute: 1.9 10*3/uL (ref 0.7–3.1)
Lymphs: 31 %
MCH: 29.3 pg (ref 26.6–33.0)
MCHC: 33 g/dL (ref 31.5–35.7)
MCV: 89 fL (ref 79–97)
Monocytes Absolute: 0.5 10*3/uL (ref 0.1–0.9)
Monocytes: 7 %
Neutrophils Absolute: 3.7 10*3/uL (ref 1.4–7.0)
Neutrophils: 59 %
Platelets: 220 10*3/uL (ref 150–450)
RBC: 4.98 x10E6/uL (ref 3.77–5.28)
RDW: 14.1 % (ref 11.7–15.4)
WBC: 6.2 10*3/uL (ref 3.4–10.8)

## 2020-11-30 LAB — THYROID PANEL WITH TSH
Free Thyroxine Index: 4 (ref 1.2–4.9)
T3 Uptake Ratio: 32 % (ref 24–39)
T4, Total: 12.5 ug/dL — ABNORMAL HIGH (ref 4.5–12.0)
TSH: 0.551 u[IU]/mL (ref 0.450–4.500)

## 2020-12-02 NOTE — Progress Notes (Signed)
Pt is returning nurse call about labs. Please call her back at [phone]

## 2020-12-06 NOTE — Progress Notes (Signed)
Pt r/c about labs please call [phone]

## 2020-12-06 NOTE — Progress Notes (Signed)
NA 6/14-jhb

## 2021-02-28 ENCOUNTER — Other Ambulatory Visit: Payer: Self-pay | Admitting: Nurse Practitioner

## 2021-02-28 ENCOUNTER — Telehealth: Payer: Self-pay | Admitting: Nurse Practitioner

## 2021-02-28 DIAGNOSIS — I4819 Other persistent atrial fibrillation: Secondary | ICD-10-CM

## 2021-02-28 NOTE — Progress Notes (Signed)
Repeat ref cardiology

## 2021-02-28 NOTE — Telephone Encounter (Signed)
Please review

## 2021-02-28 NOTE — Telephone Encounter (Signed)
Pt called to check on her referral to a cardiologist. Aware that the referral has been closed out. Would like to have another referral put in. Does not want an with MMM until she sees a cardiologist.

## 2021-03-02 ENCOUNTER — Ambulatory Visit: Payer: Self-pay | Admitting: Nurse Practitioner

## 2021-04-23 NOTE — Progress Notes (Signed)
Cardiology Office Note:    Date:  04/23/2021   ID:  ABYGALE KARPF, DOB [birth year], MRN [id number]  PCP:  Chevis Pretty, FNP   [facility] Providers Cardiologist:  None     Referring MD: Hassell Done, Adaira Centola-Margaret, *   No chief complaint on file. New diagnosis atrial fibrillation  History of Present Illness:    Angela Dean is a 65 y.o. female with a hx of atrial fibrillation CHADS2VASC 2 on aspirin, Thyrod nodule s/p thyroidectomy, breast cancer mastectomy on the L side, no radiation, no chemo  She notes diagnosis of atrial fibrillation. When resting after taking care of her mother , she feels palpitations. With activity she has no symptoms. She denies shortness of breath. She denies fatigue but was more fatigued  in June. She stopped taking aspirin thinking it was affecting. No bleeding. No syncope. She denies angina, dyspnea on exertion, lower extremity edema, PND or orthopnea. No smoking. Mother has atrial fibrillation. Father deceased in 17-Sep-2010, no heart disease.No siblings with heart disease. Children no heart disease. She was diagnosed with thyroid dx 30 years ago with nodules. S/p removal. She is on synthroid.    11/29/2020 TSH 0.5 Crt 0.9 LDL 112, HDL 76, TC 201  Normal LFTs  ECG 08/23/2020- atrial fibrillation   Past Medical History:  Diagnosis Date   Breast cancer ([place])    Cancer ([place])    Thyroid disease     Past Surgical History:  Procedure Laterality Date   MASTECTOMY Left 09/17/95    Current Medications: No outpatient medications have been marked as taking for the 04/24/21 encounter (Office Visit) with Janina Mayo, MD.     Allergies:   Patient has no known allergies.   Social History   Socioeconomic History   Marital status: Divorced    Spouse name: Not on file   Number of children: Not on file   Years of education: Not on file   Highest education level: Not on file  Occupational History   Not on file  Tobacco Use   Smoking status: Never    Smokeless tobacco: Never  Substance and Sexual Activity   Alcohol use: No   Drug use: No   Sexual activity: Not on file  Other Topics Concern   Not on file  Social History Narrative   Not on file   Social Determinants of Health   Financial Resource Strain: Not on file  Food Insecurity: Not on file  Transportation Needs: Not on file  Physical Activity: Not on file  Stress: Not on file  Social Connections: Not on file     Family History: The patient's family history includes Cancer in her sister, sister, and sister; Heart disease in her mother; Hypertension in her mother.  ROS:   Please see the history of present illness.     All other systems reviewed and are negative.  EKGs/Labs/Other Studies Reviewed:    The following studies were reviewed today:   EKG:  EKG is  ordered today.  The ekg ordered today demonstrates   Atrial Fibrillation rate 113. Qtc 458ms   Recent Labs: 11/29/2020: ALT 15; BUN 12; Creatinine, Ser 0.93; Hemoglobin 14.6; Platelets 220; Potassium 4.9; Sodium 139; TSH 0.551  Recent Lipid Panel    Component Value Date/Time   CHOL 201 (H) 11/29/2020 1115   TRIG 72 11/29/2020 1115   TRIG 83 06/03/2014 1104   HDL 76 11/29/2020 1115   HDL 77 06/03/2014 1104   CHOLHDL 2.6 11/29/2020 1115  LDLCALC 112 (H) 11/29/2020 1115   LDLCALC 109 (H) 02/17/2013 1134     Risk Assessment/Calculations:    CHA2DS2-VASc Score = 2   This indicates a 2.2% annual risk of stroke. The patient's score is based upon: CHF History: 0 HTN History: 0 Diabetes History: 0 Stroke History: 0 Vascular Disease History: 0 Age Score: 1 Gender Score: 1      The 10-year ASCVD risk score (Arnett DK, et al., 2019) is: 6.3%   Values used to calculate the score:     Age: 34 years     Sex: Female     Is Non-Hispanic African American: No     Diabetic: No     Tobacco smoker: No     Systolic Blood Pressure: 944 mmHg     Is BP treated: No     HDL Cholesterol: 76 mg/dL     Total  Cholesterol: 201 mg/dL   Physical Exam:    VS:   Vitals:   04/24/21 1014  BP: (!) 146/97  Pulse: (!) 113  SpO2: 99%     Wt Readings from Last 3 Encounters:  11/29/20 160 lb (72.6 kg)  08/23/20 156 lb (70.8 kg)  03/03/19 157 lb 4 oz (71.3 kg)     GEN:  Well nourished, well developed in no acute distress HEENT: Normal NECK: No JVD; No carotid bruits LYMPHATICS: No lymphadenopathy CARDIAC: irregullarly irregular, tachycardic no murmurs, rubs, gallops Vasc: faint radial pulses RESPIRATORY:  Clear to auscultation without rales, wheezing or rhonchi  ABDOMEN: Soft, non-tender, non-distended MUSCULOSKELETAL:  No edema; No deformity  SKIN: Warm and dry NEUROLOGIC:  Alert and oriented x 3 PSYCHIATRIC:  Normal affect   ASSESSMENT:    #Persistent Atrial Fibrillation: continue aspirin 81 mg daily with CHADS2VASC 2. She is asymptomatic. She has hx of hypothyroid/hyperthyroid. Will get an echo today for LV function, LA size, assessment of mitral valve. She has normal renal fxn, liver fxn.TSH 0.5 borderline. No problems swallowing or strictures. We discussed potential for DCCV/TEE. Echo will help with antiarrhythmic options. Will increase her BB for now.   PLAN:    In order of problems listed above:  TTE Continue aspirin Atrial fibrillation clinic referral Increase metoprolol 50 mg XL Follow up 3 months   Medication Adjustments/Labs and Tests Ordered: Current medicines are reviewed at length with the patient today.  Concerns regarding medicines are outlined above.    Signed, Janina Mayo, MD  04/23/2021 8:27 PM    [facility]

## 2021-04-24 ENCOUNTER — Other Ambulatory Visit: Payer: Self-pay

## 2021-04-24 ENCOUNTER — Ambulatory Visit: Payer: 59 | Admitting: Internal Medicine

## 2021-04-24 ENCOUNTER — Encounter: Payer: Self-pay | Admitting: Internal Medicine

## 2021-04-24 ENCOUNTER — Ambulatory Visit: Payer: BC Managed Care – PPO | Admitting: Internal Medicine

## 2021-04-24 VITALS — BP 146/97 | HR 113 | Ht 64.5 in | Wt 157.2 lb

## 2021-04-24 DIAGNOSIS — I4819 Other persistent atrial fibrillation: Secondary | ICD-10-CM | POA: Diagnosis not present

## 2021-04-24 DIAGNOSIS — I4891 Unspecified atrial fibrillation: Secondary | ICD-10-CM

## 2021-04-24 MED ORDER — METOPROLOL SUCCINATE ER 50 MG PO TB24: 50.0000 mg | ORAL_TABLET | Freq: Every day | ORAL | 3 refills | Status: DC

## 2021-04-24 NOTE — Patient Instructions (Signed)
Medication Instructions:  Change metoprolol to 50 mg daily. *If you need a refill on your cardiac medications before your next appointment, please call your pharmacy*   Testing/Procedures: Your physician has requested that you have an echocardiogram. Echocardiography is a painless test that uses sound waves to create images of your heart. It provides your doctor with information about the size and shape of your heart and how well your heart's chambers and valves are working. This procedure takes approximately one hour. There are no restrictions for this procedure.  You have been referred to atrial fibrillation clinic.   Follow-Up: At [facility], you and your health needs are our priority.  As part of our continuing mission to provide you with exceptional heart care, we have created designated Provider Care Teams.  These Care Teams include your primary Cardiologist (physician) and Advanced Practice Providers (APPs -  Physician Assistants and Nurse Practitioners) who all work together to provide you with the care you need, when you need it.  We recommend signing up for the patient portal called "MyChart".  Sign up information is provided on this After Visit Summary.  MyChart is used to connect with patients for Virtual Visits (Telemedicine).  Patients are able to view lab/test results, encounter notes, upcoming appointments, etc.  Non-urgent messages can be sent to your provider as well.   To learn more about what you can do with MyChart, go to [email or web address].    Your next appointment:   3 month(s)  The format for your next appointment:   In Person  Provider:   Dr. Harl Bowie

## 2021-05-16 ENCOUNTER — Other Ambulatory Visit: Payer: Self-pay

## 2021-05-16 ENCOUNTER — Encounter (HOSPITAL_COMMUNITY): Payer: Self-pay | Admitting: Nurse Practitioner

## 2021-05-16 ENCOUNTER — Ambulatory Visit (HOSPITAL_COMMUNITY)
Admission: RE | Admit: 2021-05-16 | Discharge: 2021-05-16 | Disposition: A | Discharge status: Home / Self Care | Payer: BC Managed Care – PPO | Source: Ambulatory Visit | Attending: Nurse Practitioner | Admitting: Nurse Practitioner

## 2021-05-16 ENCOUNTER — Ambulatory Visit (HOSPITAL_BASED_OUTPATIENT_CLINIC_OR_DEPARTMENT_OTHER): Payer: BC Managed Care – PPO

## 2021-05-16 ENCOUNTER — Other Ambulatory Visit (HOSPITAL_COMMUNITY): Payer: BC Managed Care – PPO

## 2021-05-16 VITALS — BP 156/110 | HR 115 | Ht 64.5 in | Wt 157.0 lb

## 2021-05-16 DIAGNOSIS — I4819 Other persistent atrial fibrillation: Secondary | ICD-10-CM

## 2021-05-16 DIAGNOSIS — I4891 Unspecified atrial fibrillation: Secondary | ICD-10-CM

## 2021-05-16 DIAGNOSIS — I083 Combined rheumatic disorders of mitral, aortic and tricuspid valves: Secondary | ICD-10-CM | POA: Insufficient documentation

## 2021-05-16 MED ORDER — METOPROLOL SUCCINATE ER 50 MG PO TB24: ORAL_TABLET | ORAL | 3 refills | Status: DC

## 2021-05-16 MED ORDER — APIXABAN 5 MG PO TABS: 5.0000 mg | ORAL_TABLET | Freq: Two times a day (BID) | ORAL | 3 refills | Status: DC

## 2021-05-16 NOTE — Patient Instructions (Signed)
Stop aspirin  Start Eliquis 5mg  twice a day  Increase metoprolol to 50mg  in the morning and 25mg  in the evening

## 2021-05-16 NOTE — Progress Notes (Addendum)
Primary Care Physician: Chevis Pretty, FNP Referring Physician:Dr. Phineas Inches   Angela Dean is a 65 y.o. female with a h/o Angela Dean is a 65 y.o. female with a hx of atrial fibrillation CHADS2VASC 2 on aspirin, Thyrod nodule s/p thyroidectomy, breast cancer mastectomy on the L side, no radiation, no chemo   She is in the afib clinic for evaluation after being seen by Dr. Harl Bowie 10/31 for afib. She has RVR today at 115 bpm. She is asymptomatic. It appears that she is persistent. Al thought she has a CHA2DS2-VASc Score = 2, I discussed with pt to pursue restoring SR, it will likely mean pursing cardioversion and for this she will need to be on anticoagulation for at least 21 days prior to this. She does not drink alcohol, smoke, or snore.    Today, she denies symptoms of palpitations, chest pain, shortness of breath, orthopnea, PND, lower extremity edema, dizziness, presyncope, syncope, or neurologic sequela. The patient is tolerating medications without difficulties and is otherwise without complaint today.   Past Medical History:  Diagnosis Date   Breast cancer ([place])    Cancer ([place])    Thyroid disease    Past Surgical History:  Procedure Laterality Date   MASTECTOMY Left 1997    Current Outpatient Medications  Medication Sig Dispense Refill   apixaban (ELIQUIS) 5 MG TABS tablet Take 1 tablet (5 mg total) by mouth 2 (two) times daily. 60 tablet 3   levothyroxine (SYNTHROID) 100 MCG tablet Take 1 tablet (100 mcg total) by mouth daily before breakfast. 30 tablet 11   metoprolol succinate (TOPROL-XL) 50 MG 24 hr tablet Take 1 tablet in the AM and 1/2 tablet in the PM 135 tablet 3   No current facility-administered medications for this encounter.    No Known Allergies  Social History   Socioeconomic History   Marital status: Divorced    Spouse name: Not on file   Number of children: Not on file   Years of education: Not on file   Highest education level: Not on file   Occupational History   Not on file  Tobacco Use   Smoking status: Never   Smokeless tobacco: Never  Substance and Sexual Activity   Alcohol use: No   Drug use: No   Sexual activity: Not on file  Other Topics Concern   Not on file  Social History Narrative   Not on file   Social Determinants of Health   Financial Resource Strain: Not on file  Food Insecurity: Not on file  Transportation Needs: Not on file  Physical Activity: Not on file  Stress: Not on file  Social Connections: Not on file  Intimate Partner Violence: Not on file    Family History  Problem Relation Age of Onset   Heart disease Mother    Hypertension Mother    Cancer Sister    Cancer Sister    Cancer Sister     ROS- All systems are reviewed and negative except as per the HPI above  Physical Exam: Vitals:   05/16/21 1012  BP: (!) 156/110  Pulse: (!) 115  Weight: 71.2 kg  Height: 5' 4.5" (1.638 m)   Wt Readings from Last 3 Encounters:  05/16/21 71.2 kg  04/24/21 71.3 kg  11/29/20 72.6 kg    Labs: Lab Results  Component Value Date   NA 139 11/29/2020   K 4.9 11/29/2020   CL 102 11/29/2020   CO2 23 11/29/2020   GLUCOSE  87 11/29/2020   BUN 12 11/29/2020   CREATININE 0.93 11/29/2020   CALCIUM 10.0 11/29/2020   No results found for: INR Lab Results  Component Value Date   CHOL 201 (H) 11/29/2020   HDL 76 11/29/2020   LDLCALC 112 (H) 11/29/2020   TRIG 72 11/29/2020     GEN- The patient is well appearing, alert and oriented x 3 today.   Head- normocephalic, atraumatic Eyes-  Sclera clear, conjunctiva pink Ears- hearing intact Oropharynx- clear Neck- supple, no JVP Lymph- no cervical lymphadenopathy Lungs- Clear to ausculation bilaterally, normal work of breathing Heart- Regular rate and rhythm, no murmurs, rubs or gallops, PMI not laterally displaced GI- soft, NT, ND, + BS Extremities- no clubbing, cyanosis, or edema, no palpable pulse rt radial pulse  MS- no significant  deformity or atrophy Skin- no rash or lesion Psych- euthymic mood, full affect Neuro- strength and sensation are intact  EKG-afib with  RVR at 115 bpm  Echo- pending  later today   Assessment and Plan:  1. Afib  Recent dx General education re afib Risk factors reviewed  Will increase rate control to 25 mg pm, continue 50 mg daily  Echo pending   2. CHA2DS2VASc  score of 2 To restore SR will need to start eliquis 5 mg bid for 21 days prior to possible cardioversion She will stop ASA Pt denies a bleeding history Bleeding precautions discussed    Of note- My CMA brought to my attention that she could not hear  a BP rt arm I checked BP as well  Could not appreciate a brachial or radial pulse I as well could not hear a BP on the rt arm I will bring  to the attention of Dr. Harl Bowie to see if any other f/u is needed   F/u afib clinic in 2 weeks   Butch Penny C. Bryler Dibble, [facility] [address] [place], [place] [address] [phone]

## 2021-05-17 ENCOUNTER — Other Ambulatory Visit: Payer: Self-pay | Admitting: Internal Medicine

## 2021-05-17 ENCOUNTER — Telehealth: Payer: Self-pay

## 2021-05-17 DIAGNOSIS — R0989 Other specified symptoms and signs involving the circulatory and respiratory systems: Secondary | ICD-10-CM

## 2021-05-17 LAB — ECHOCARDIOGRAM COMPLETE
Area-P 1/2: 3.93 cm2
Height: 64.5 in
P 1/2 time: 374 msec
S' Lateral: 3.2 cm
Weight: 2512 oz

## 2021-05-17 NOTE — Telephone Encounter (Signed)
Spoke with pt regarding visit with the a-fib clinic yesterday. Per NP at this office visit could not find pulse in pt's right arm (no radial or brachial). Message sent to Dr. Beckie Busing to make her aware. Per Dr. Harl Bowie will get an upper extremity artery duplex to assess. Made pt aware that Dr. Harl Bowie would like to have this done in the next week or so. Pt verbalizes understanding. Communicated with scheduler who made appointment for pt and called her with date and time.

## 2021-05-23 ENCOUNTER — Encounter (HOSPITAL_BASED_OUTPATIENT_CLINIC_OR_DEPARTMENT_OTHER): Payer: Self-pay | Admitting: Emergency Medicine

## 2021-05-23 ENCOUNTER — Encounter (HOSPITAL_COMMUNITY): Payer: Self-pay | Admitting: Radiology

## 2021-05-23 ENCOUNTER — Emergency Department (HOSPITAL_BASED_OUTPATIENT_CLINIC_OR_DEPARTMENT_OTHER): Payer: BC Managed Care – PPO

## 2021-05-23 ENCOUNTER — Ambulatory Visit (HOSPITAL_BASED_OUTPATIENT_CLINIC_OR_DEPARTMENT_OTHER)
Admission: RE | Admit: 2021-05-23 | Discharge: 2021-05-23 | Disposition: A | Discharge status: Home / Self Care | Payer: BC Managed Care – PPO | Source: Ambulatory Visit | Attending: Internal Medicine | Admitting: Internal Medicine

## 2021-05-23 ENCOUNTER — Other Ambulatory Visit: Payer: Self-pay

## 2021-05-23 ENCOUNTER — Emergency Department (HOSPITAL_BASED_OUTPATIENT_CLINIC_OR_DEPARTMENT_OTHER)
Admission: EM | Admit: 2021-05-23 | Discharge: 2021-05-23 | Disposition: A | Discharge status: Home / Self Care | Payer: BC Managed Care – PPO | Attending: Emergency Medicine | Admitting: Emergency Medicine

## 2021-05-23 ENCOUNTER — Other Ambulatory Visit: Payer: Self-pay | Admitting: Internal Medicine

## 2021-05-23 DIAGNOSIS — Z79899 Other long term (current) drug therapy: Secondary | ICD-10-CM | POA: Diagnosis not present

## 2021-05-23 DIAGNOSIS — Z853 Personal history of malignant neoplasm of breast: Secondary | ICD-10-CM | POA: Insufficient documentation

## 2021-05-23 DIAGNOSIS — I10 Essential (primary) hypertension: Secondary | ICD-10-CM | POA: Insufficient documentation

## 2021-05-23 DIAGNOSIS — E039 Hypothyroidism, unspecified: Secondary | ICD-10-CM | POA: Insufficient documentation

## 2021-05-23 DIAGNOSIS — R0689 Other abnormalities of breathing: Secondary | ICD-10-CM | POA: Insufficient documentation

## 2021-05-23 DIAGNOSIS — R0989 Other specified symptoms and signs involving the circulatory and respiratory systems: Secondary | ICD-10-CM | POA: Insufficient documentation

## 2021-05-23 DIAGNOSIS — I773 Arterial fibromuscular dysplasia: Secondary | ICD-10-CM | POA: Insufficient documentation

## 2021-05-23 DIAGNOSIS — Z7901 Long term (current) use of anticoagulants: Secondary | ICD-10-CM | POA: Diagnosis not present

## 2021-05-23 DIAGNOSIS — I771 Stricture of artery: Secondary | ICD-10-CM | POA: Diagnosis not present

## 2021-05-23 DIAGNOSIS — I4891 Unspecified atrial fibrillation: Secondary | ICD-10-CM | POA: Insufficient documentation

## 2021-05-23 DIAGNOSIS — I7121 Aneurysm of the ascending aorta, without rupture: Secondary | ICD-10-CM | POA: Diagnosis not present

## 2021-05-23 HISTORY — DX: Unspecified atrial fibrillation: I48.91

## 2021-05-23 LAB — CBC
HCT: 44.5 % (ref 36.0–46.0)
Hemoglobin: 14.9 g/dL (ref 12.0–15.0)
MCH: 29.5 pg (ref 26.0–34.0)
MCHC: 33.5 g/dL (ref 30.0–36.0)
MCV: 88.1 fL (ref 80.0–100.0)
Platelets: 215 10*3/uL (ref 150–400)
RBC: 5.05 MIL/uL (ref 3.87–5.11)
RDW: 13.3 % (ref 11.5–15.5)
WBC: 6.3 10*3/uL (ref 4.0–10.5)
nRBC: 0 % (ref 0.0–0.2)

## 2021-05-23 LAB — TROPONIN I (HIGH SENSITIVITY)
Troponin I (High Sensitivity): 4 ng/L (ref <18)
Troponin I (High Sensitivity): 4 ng/L (ref <18)

## 2021-05-23 LAB — BASIC METABOLIC PANEL
Anion gap: 8 (ref 5–15)
BUN: 9 mg/dL (ref 8–23)
CO2: 27 mmol/L (ref 22–32)
Calcium: 10.3 mg/dL (ref 8.9–10.3)
Chloride: 106 mmol/L (ref 98–111)
Creatinine, Ser: 0.72 mg/dL (ref 0.44–1.00)
GFR, Estimated: >60 mL/min (ref >60)
Glucose, Bld: 100 mg/dL — ABNORMAL HIGH (ref 70–99)
Potassium: 4.2 mmol/L (ref 3.5–5.1)
Sodium: 141 mmol/L (ref 135–145)

## 2021-05-23 MED ORDER — IOHEXOL 350 MG/ML SOLN
100.0000 mL | Freq: Once | INTRAVENOUS | Status: AC | PRN
  Administered 2021-05-23: 100 mL via INTRAVENOUS

## 2021-05-23 MED ORDER — METOPROLOL SUCCINATE ER 50 MG PO TB24: 150.0000 mg | ORAL_TABLET | Freq: Every day | ORAL | 1 refills | Status: DC

## 2021-05-23 NOTE — Progress Notes (Addendum)
Called to assess patient of Dr. Phineas Inches in office with abnormal vascular studies. Concern raised for inability to detect flow in the right vertebral artery, suspected occlusion with high velocities in right subclavian artery. Vascular studies performed to evaluate absent right arm pulses.  Review of limited images of the aortic arch raised concern with vascular staff of possible dissection as a cause of her abnormal vascular ultrasound findings. Patient is stable and per clinical staff, has reported difficulty obtaining blood pressure on the right arm chronically.  Given concern in the office for need to rule out neck and chest dissection, would recommend patient present to the ER for CTA neck and CTA chest angio. Most likely this finding represents a chronically occluded right subclavian artery which could be managed as an outpatient.   Discussed with chief sonographer [employer].

## 2021-05-23 NOTE — ED Provider Notes (Signed)
[facility] EMERGENCY DEPT Provider Note   CSN: [id number] Arrival date & time: 05/23/21  1222     History Chief Complaint  Patient presents with   Referral from Cardiology    Angela Dean is a 65 y.o. female.  HPI Patient presents to the ED at the request of her cardiologist.  She has recently been diagnosed with atrial fibrillation.  She is on metoprolol and anticoagulation.  She was found to have abnormal pulses in her right arm.  For this reason, she underwent a arterial duplex study earlier today.  There was concern of possible arterial occlusion and/or dissection.  She was sent to the ED for CT angiography.  On arrival, she denies any new or recent symptoms.      Past Medical History:  Diagnosis Date   Atrial fibrillation ([place])    Breast cancer ([place])    Cancer [facility])    Thyroid disease     Patient Active Problem List   Diagnosis Date Noted   Hyperlipidemia 09/01/2019   Hypothyroidism 06/03/2014    Past Surgical History:  Procedure Laterality Date   MASTECTOMY Left 1997     OB History   No obstetric history on file.     Family History  Problem Relation Age of Onset   Heart disease Mother    Hypertension Mother    Cancer Sister    Cancer Sister    Cancer Sister     Social History   Tobacco Use   Smoking status: Never   Smokeless tobacco: Never  Substance Use Topics   Alcohol use: No   Drug use: No    Home Medications Prior to Admission medications   Medication Sig Start Date End Date Taking? Authorizing Provider  apixaban (ELIQUIS) 5 MG TABS tablet Take 1 tablet (5 mg total) by mouth 2 (two) times daily. 05/16/21  Yes Sherran Needs, NP  levothyroxine (SYNTHROID) 100 MCG tablet Take 1 tablet (100 mcg total) by mouth daily before breakfast. 09/20/20  Yes Hassell Done, Mary-Margaret, FNP  metoprolol succinate (TOPROL XL) 50 MG 24 hr tablet Take 3 tablets (150 mg total) by mouth daily. Take with or immediately following a meal. 05/23/21   Yes Fredia Sorrow, MD    Allergies    Patient has no known allergies.  Review of Systems   Review of Systems  Constitutional:  Negative for chills and fever.  HENT:  Negative for ear pain and sore throat.   Eyes:  Negative for pain and visual disturbance.  Respiratory:  Negative for cough and shortness of breath.   Cardiovascular:  Negative for chest pain and palpitations.  Gastrointestinal:  Negative for abdominal pain and vomiting.  Genitourinary:  Negative for dysuria and hematuria.  Musculoskeletal:  Negative for arthralgias and back pain.  Skin:  Negative for color change and rash.  Neurological:  Negative for seizures, syncope, weakness and numbness.  All other systems reviewed and are negative.  Physical Exam Updated Vital Signs BP (!) 158/103   Pulse 100   Temp 98 F (36.7 C)   Resp (!) 23   Ht 5\' 4"  (1.626 m)   Wt 70.3 kg   SpO2 100%   BMI 26.61 kg/m   Physical Exam Vitals and nursing note reviewed.  Constitutional:      General: She is not in acute distress.    Appearance: Normal appearance. She is well-developed and normal weight. She is not ill-appearing, toxic-appearing or diaphoretic.  HENT:     Head: Normocephalic and  atraumatic.     Right Ear: External ear normal.     Left Ear: External ear normal.     Nose: Nose normal.  Eyes:     Conjunctiva/sclera: Conjunctivae normal.  Cardiovascular:     Rate and Rhythm: Normal rate and regular rhythm.     Heart sounds: No murmur heard.    Comments: Blood pressure normal and right arm and increased and left arm Pulmonary:     Effort: Pulmonary effort is normal. No respiratory distress.     Breath sounds: Normal breath sounds. No wheezing or rales.  Abdominal:     Palpations: Abdomen is soft.     Tenderness: There is no abdominal tenderness.  Musculoskeletal:        General: No swelling.     Cervical back: Neck supple.  Skin:    General: Skin is warm and dry.     Capillary Refill: Capillary refill  takes less than 2 seconds.     Coloration: Skin is not jaundiced or pale.  Neurological:     General: No focal deficit present.     Mental Status: She is alert and oriented to person, place, and time.     Cranial Nerves: No cranial nerve deficit.     Sensory: No sensory deficit.     Motor: No weakness.     Coordination: Coordination normal.  Psychiatric:        Mood and Affect: Mood normal.        Behavior: Behavior normal.        Thought Content: Thought content normal.        Judgment: Judgment normal.    ED Results / Procedures / Treatments   Labs (all labs ordered are listed, but only abnormal results are displayed) Labs Reviewed  BASIC METABOLIC PANEL - Abnormal; Notable for the following components:      Result Value   Glucose, Bld 100 (*)    All other components within normal limits  CBC  TROPONIN I (HIGH SENSITIVITY)  TROPONIN I (HIGH SENSITIVITY)    EKG EKG Interpretation  Date/Time:  Tuesday May 23 2021 13:02:01 EST Ventricular Rate:  109 PR Interval:    QRS Duration: 72 QT Interval:  322 QTC Calculation: 433 R Axis:   65 Text Interpretation: Atrial fibrillation with rapid ventricular response Septal infarct , age undetermined ST & T wave abnormality, consider inferior ischemia Abnormal ECG Confirmed by Godfrey Pick [phone]) on 05/23/2021 1:47:02 PM  Radiology CT ANGIO NECK W OR WO CONTRAST  Addendum Date: 05/23/2021   ADDENDUM REPORT: 05/23/2021 17:00 ADDENDUM: More distally, the right subclavian artery may be severely stenotic or occluded (age indeterminate). Evaluation is limited by artifact from adjacent venous contrast. Electronically Signed   By: Macy Mis M.D.   On: 05/23/2021 17:00   Result Date: 05/23/2021 CLINICAL DATA:  Vascular murmur, upper arm EXAM: CT ANGIOGRAPHY NECK TECHNIQUE: Multidetector CT imaging of the neck was performed using the standard protocol during bolus administration of intravenous contrast. Multiplanar CT image  reconstructions and MIPs were obtained to evaluate the vascular anatomy. Carotid stenosis measurements (when applicable) are obtained utilizing NASCET criteria, using the distal internal carotid diameter as the denominator. CONTRAST:  134mL OMNIPAQUE IOHEXOL 350 MG/ML SOLN COMPARISON:  None. FINDINGS: Aortic arch: Trace calcified plaque. Great vessel origins are patent. There is direct origin of the left vertebral from the arch. There are 2 areas of narrowing of the right subclavian artery proximal and distal to the vertebral origin. The more proximal  measures less than 50% and the more distal measures 60%. Similarly there are 2 areas of narrowing of the left subclavian artery, both less than 50%. Right carotid system: Patent. Mild tortuosity of the cervical internal carotid with a mildly beaded appearance. There is a linear filling defect within the distal cervical ICA (series 4, images 20 and 27). Left carotid system: Patent. Mild tortuosity of the cervical ICA with a mildly beaded appearance. Vertebral arteries: Patent.  Right vertebral artery is dominant. Skeleton: Cervical spine degenerative changes. Other neck: Thyroidectomy. Upper chest: Dictated separately. IMPRESSION: Few areas of right greater than left subclavian artery narrowing measuring up to 60%. Anomalous ligamentous bands are possible, but there is also a mildly beaded appearance of the cervical internal carotids suggesting an underlying vasculopathy (for example, fibromuscular dysplasia). Additionally, linear filling defect in the distal cervical ICA could reflect a superimposed dissection. Electronically Signed: By: Macy Mis M.D. On: 05/23/2021 15:27   CT Angio Chest PE W and/or Wo Contrast  Result Date: 05/23/2021 CLINICAL DATA:  PE suspected, high prob EXAM: CT ANGIOGRAPHY CHEST WITH CONTRAST TECHNIQUE: Multidetector CT imaging of the chest was performed using the standard protocol during bolus administration of intravenous contrast.  Multiplanar CT image reconstructions and MIPs were obtained to evaluate the vascular anatomy. CONTRAST:  125mL OMNIPAQUE IOHEXOL 350 MG/ML SOLN COMPARISON:  None. FINDINGS: Cardiovascular: Preferential opacification of the pulmonary arteries. No evidence of pulmonary embolism. Dilated right atrium.No pericardial disease.Ascending aorta measures up to 4.0 cm (series 4, image 66).Minimal aortic arch calcifications. Multiple collateral vessels along the right upper chest. Mediastinum/Nodes: No lymphadenopathy.Prior thyroidectomy.Esophagus is unremarkable.The trachea is unremarkable. Lungs/Pleura: Focal tree-in-bud nodularity in the anteromedial aspect of the right upper lobe. No suspicious pulmonary nodules or masses. Bibasilar linear atelectasis/scarring.No suspicious pulmonary nodules or masses.No pleural effusion.No pneumothorax. Upper Abdomen: No acute abnormality. Musculoskeletal: Mild central compression of T5, likely chronic. No definite acute osseous abnormality. No suspicious lytic or blastic lesions.Left breast prosthesis. Left axillary postprocedural changes. Review of the MIP images confirms the above findings. IMPRESSION: No evidence of pulmonary embolism. Focal tree-in-bud nodularity in the anteromedial aspect of the right upper lobe, likely infectious inflammatory. Consider follow-up chest CT in 3-6 months to ensure resolution. 4.0 cm ascending aortic aneurysm. Recommend annual imaging followup by CTA or MRA. This recommendation follows 2010 ACCF/AHA/AATS/ACR/ASA/SCA/SCAI/SIR/STS/SVM Guidelines for the Diagnosis and Management of Patients with Thoracic Aortic Disease. Circulation. 2010; 121: M546-T035. Aortic aneurysm NOS (ICD10-I71.9). Please note that this exam was optimized for the evaluation of the pulmonary arteries. If thoracic aortic pathology is suspected, recommend CTA of the chest with contrast timing optimized to evaluate the aorta. This was discussed by telephone at the time of interpretation  on 05/23/2021 at 3:39 pm to provider Godfrey Pick , who verbally acknowledged these results. Electronically Signed   By: Maurine Simmering M.D.   On: 05/23/2021 15:45   VAS [place] UPPER EXTREMITY ARTERIAL DUPLEX  Result Date: 05/23/2021  UPPER EXTREMITY DUPLEX STUDY Patient Name:  Angela Dean  Date of Exam:   05/23/2021 Medical Rec #: [id number]     Accession #:    [id number] Date of Birth: March 16, 1956    Patient Gender: F Patient Age:   53 years Exam Location:  [facility] Procedure:      VAS [place] UPPER EXTREMITY ARTERIAL DUPLEX Referring Phys: [facility] --------------------------------------------------------------------------------  Indications: Pulse irregularity on the right arm. Patient denies any arm              claudication symptoms or rest  pain.               Today's WBIs are .67 on the right and 1.06 on the left. History:     Patient has a history of History of left breast mastectomy.  Risk Factors: Hyperlipidemia, no history of smoking. Comparison Study: NA Performing Technologist: Sharlett Iles RVT  Examination Guidelines: A complete evaluation includes B-mode imaging, spectral Doppler, color Doppler, and power Doppler as needed of all accessible portions of each vessel. Bilateral testing is considered an integral part of a complete examination. Limited examinations for reoccurring indications may be performed as noted.  Right Doppler Findings: +---------------+----------+----------+-------------+----------------+ Site           PSV (cm/s)Waveform  Stenosis     Comments         +---------------+----------+----------+-------------+----------------+ Subclavian Prox81        monophasic                              +---------------+----------+----------+-------------+----------------+ Subclavian Mid 33        monophasic                              +---------------+----------+----------+-------------+----------------+ Subclavian Dist500       monophasic>50% stenosisthrombus evident  +---------------+----------+----------+-------------+----------------+ Axillary       24        monophasic                              +---------------+----------+----------+-------------+----------------+ Brachial Prox  27        monophasic                              +---------------+----------+----------+-------------+----------------+ Brachial Mid   31        monophasic                              +---------------+----------+----------+-------------+----------------+ Brachial Dist  20        monophasic                              +---------------+----------+----------+-------------+----------------+ Radial Prox    12        biphasic                                +---------------+----------+----------+-------------+----------------+ Radial Mid     13        biphasic                                +---------------+----------+----------+-------------+----------------+ Radial Dist    13        monophasic                              +---------------+----------+----------+-------------+----------------+ Ulnar Prox     11        monophasic                              +---------------+----------+----------+-------------+----------------+ Ulnar Mid      10  monophasic                              +---------------+----------+----------+-------------+----------------+ Ulnar Dist     15        monophasic>50% stenosis                 +---------------+----------+----------+-------------+----------------+ Monophasic flow noted in the right innominate with velocities at 45 cm/s. Hourglass dilatation of the right axillary artery, not technically aneurysmal. Possible thrombus with elevated velocities evident in the distal subclavian artery, with a VR of 8.9,  followed by a short segment occlusion.  Summary:  Right: >50% stenosis visualized in the right upper extremity.        Obstruction noted in the subclavian artery. Possible thrombus        with elevated  velocities evident in the distal subclavian        artery, with a VR of 8.9, followed by a short segment        occlusion.         No visible plaque at the origin of the innominate, although        flow is monophasic. This could be a pre-occlusive area versus        other pathology. Left: Obstruction noted in the brachial artery. *See table(s) above for measurements and observations. See WBI report. Vascular consult recommended. Electronically signed by Larae Grooms MD on 05/23/2021 at 6:13:49 PM.    Final    VAS [place] DOP BILAT COMP TOS DIGITS REYNAUD  Result Date: 05/23/2021 UPPER EXTREMITY DOPPLER STUDY Patient Name:  Angela Dean  Date of Exam:   05/23/2021 Medical Rec #: [id number]     Accession #:    [id number] Date of Birth: 01/06/1956    Patient Gender: F Patient Age:   36 years Exam Location:  [facility] Procedure:      VAS UE DOPPLER BILAT/COMP TOS, DIGITS (TO&UE REYNAUDS) Referring Phys: [facility] --------------------------------------------------------------------------------  Indications: Pulse irregularity on the right arm. Patient denies any arm              claudication symptoms or rest pain. History:     Patient has a h/o left breast mastectomy.  Risk Factors: Hyperlipidemia, no history of smoking. Comparison Study: NA Performing Technologist: Leavy Cella RDCS Supporting Technologist: Sharlett Iles RVT  Examination Guidelines: A complete evaluation includes B-mode imaging, spectral Doppler, color Doppler, and power Doppler as needed of all accessible portions of each vessel. Bilateral testing is considered an integral part of a complete examination. Limited examinations for reoccurring indications may be performed as noted.  Right Doppler Findings: +--------+--------+-----+-------------------+-----------------+ Site    PressureIndexDoppler            Comments          +--------+--------+-----+-------------------+-----------------+ [id number]          dampened monophasic                   +--------+--------+-----+-------------------+-----------------+ Radial  104     0.67 monophasic                           +--------+--------+-----+-------------------+-----------------+ Ulnar   100     0.65 monophasic                           +--------+--------+-----+-------------------+-----------------+ Digit   140     0.90  Abnormal Waveform +--------+--------+-----+-------------------+-----------------+  +---+---+  WBI.67  +---+---+ Left Doppler Findings: +--------+--------+-----+-----------+---------------+ Site    PressureIndexDoppler    Comments        +--------+--------+-----+-----------+---------------+ [id number]          multiphasic                +--------+--------+-----+-----------+---------------+ Radial  164     1.06 multiphasic                +--------+--------+-----+-----------+---------------+ Ulnar   151     0.97 multiphasic                +--------+--------+-----+-----------+---------------+ Digit   187     1.21            Normal Waveform +--------+--------+-----+-----------+---------------+  +---+----+  WBI1.06  +---+----+  Summary:  Right: Obstruction evident in the right upper extremity. Left: No significant arterial obstruction detected in the left upper       extremity. *See table(s) above for measurements and observations. See Right upper extremity duplex report. Vascular consult recommended. Electronically signed by Larae Grooms MD on 05/23/2021 at 6:13:10 PM.    Final     Procedures Procedures   Medications Ordered in ED Medications  iohexol (OMNIPAQUE) 350 MG/ML injection 100 mL (100 mLs Intravenous Contrast Given 05/23/21 1438)    ED Course  I have reviewed the triage vital signs and the nursing notes.  Pertinent labs & imaging results that were available during my care of the patient were reviewed by me and considered in my medical decision making (see chart for details).    MDM  Rules/Calculators/A&P                          Patient presents to the ED at the request of her cardiologist.  She has recently been diagnosed with atrial fibrillation.  She is on metoprolol and anticoagulation.  She was found to have abnormal pulses in her right arm.  For this reason, she underwent a arterial duplex study earlier today.  There was concern of possible arterial occlusion and/or dissection.  She was sent to the ED for CT angiography.  On arrival, she denies any new or recent symptoms.  CTA studies were ordered.  Results of the studies were discussed with her cardiologist, Dr. Phineas Inches.  Dr. Harl Bowie requested discussion with vascular surgery regarding abnormal ICA findings.  At this time, care of patient was signed out to oncoming ED provider.   Final Clinical Impression(s) / ED Diagnoses Final diagnoses:  Primary hypertension  Fibromuscular dysplasia of carotid artery ([place])    Rx / DC Orders ED Discharge Orders          Ordered    metoprolol succinate (TOPROL XL) 50 MG 24 hr tablet  Daily        05/23/21 1723             Godfrey Pick, MD 05/24/21 [phone]

## 2021-05-23 NOTE — Progress Notes (Unsigned)
Patient was seen at the [facility] office for upper extremity Doppler and duplex. At the physician's, Dr. Margaretann Loveless, request, patient was instructed to go to the ED at [facility] location for further testing.   Today's preliminary reports can be seen in Epic under the CV Procedure tab.  Thank you,  Sharlett Iles, RDMS, RVT

## 2021-05-23 NOTE — ED Notes (Signed)
Patient ambulatory to bathroom without difficulty.

## 2021-05-23 NOTE — ED Notes (Signed)
Patient returned from CT

## 2021-05-23 NOTE — ED Notes (Signed)
Patient transported to CT 

## 2021-05-23 NOTE — ED Notes (Signed)
RN provided AVS using Teachback Method. Patient verbalizes understanding of Discharge Instructions. Opportunity for Questioning and Answers were provided by RN. Patient Discharged from ED ambulatory to Home with Family. ? ?

## 2021-05-23 NOTE — ED Notes (Signed)
EDP at Bedside 

## 2021-05-23 NOTE — ED Provider Notes (Signed)
This with vascular surgery Dr. Trula Slade on-call for them.  He thinks that this is probably chronic.  Would recommend blood pressure control either an aspirin a day or Plavix but he does know patient is on Eliquis he says that is probably adequate.  We will have patient follow back up with cardiology.   Fredia Sorrow, MD 05/23/21 1655

## 2021-05-23 NOTE — ED Triage Notes (Signed)
Pt arrives pov, ambulatory to triage, reports from Heart care r/t f/u for afib or blockage, or possible tear.  Pt referred to ED CT angio. EDP [place] updated.

## 2021-05-23 NOTE — Discharge Instructions (Addendum)
Make an appointment to follow-up with vascular surgery Dr. Trula Slade for the internal carotid artery findings.  They will continue to follow this.  Your cardiologist recommends increasing your Toprol XL 150 mg once a day.  This is to help get better control of your blood pressure.  And continue to take your Eliquis.  Follow-up with cardiology for blood pressure recheck.  Return for any new or worse symptoms.

## 2021-05-24 ENCOUNTER — Telehealth: Payer: Self-pay | Admitting: *Deleted

## 2021-05-24 NOTE — Telephone Encounter (Signed)
-----   Message from Janina Mayo, MD sent at 05/24/2021 12:46 PM EST ----- [name], please let [name] know that she may have fibromuscular dysplasia. She had a scan at [place] recently. We can discuss this in clinic. We increased he blood pressure medication which is the plan for now. I planned to see her in follow-up, I don't see her scheduled.   Can you please schedule a follow up appointment with her with my next available slot?

## 2021-05-24 NOTE — Telephone Encounter (Signed)
Left message for pt to call.

## 2021-05-25 NOTE — Telephone Encounter (Signed)
Patient returning call to go over test results. Patient has to go to work so she will try to call back again

## 2021-05-25 NOTE — Telephone Encounter (Signed)
Attempted to call patient, left message for patient to call back to office.   

## 2021-05-26 ENCOUNTER — Telehealth: Payer: Self-pay | Admitting: Internal Medicine

## 2021-05-26 NOTE — Telephone Encounter (Signed)
Called Mrs. Angela Dean about possible diagnosis of FMD. She will continue her eliquis until FU with afib clinic and possible plan for DCCV. If she undergoes DCCV will need to continue eliquis for 4 weeks thereafter. Would probably switch back to ASA after that with FMD and Chads2vasc of 2.

## 2021-05-26 NOTE — Telephone Encounter (Signed)
   Pt is returning call, she said she is at work and try to call her after 4 pm today on her cell number

## 2021-05-26 NOTE — Telephone Encounter (Signed)
Spoke to patient echo results given.Stated she has not received results form chest ct or ct of neck.Advised results not available.Dr.Branch is not in office today.I will send message to her.

## 2021-05-30 NOTE — Telephone Encounter (Signed)
Attempted to call patient, left message for patient to call back to office to get scheduled for follow up OV with Dr. Harl Bowie.

## 2021-05-31 NOTE — Telephone Encounter (Signed)
Routed to primary nurse for assistance w/appointment

## 2021-05-31 NOTE — Telephone Encounter (Signed)
Attempted to call patient, left message for patient to call back to office.   

## 2021-05-31 NOTE — Telephone Encounter (Signed)
   Pt is returning call, she is going to work and if nurse can call her before 12 pm

## 2021-06-01 ENCOUNTER — Encounter (HOSPITAL_COMMUNITY): Payer: Self-pay | Admitting: Nurse Practitioner

## 2021-06-01 ENCOUNTER — Other Ambulatory Visit: Payer: Self-pay

## 2021-06-01 ENCOUNTER — Ambulatory Visit (HOSPITAL_COMMUNITY)
Admission: RE | Admit: 2021-06-01 | Discharge: 2021-06-01 | Disposition: A | Discharge status: Home / Self Care | Payer: BC Managed Care – PPO | Source: Ambulatory Visit | Attending: Nurse Practitioner | Admitting: Nurse Practitioner

## 2021-06-01 VITALS — BP 142/94 | HR 82 | Ht 64.0 in | Wt 156.4 lb

## 2021-06-01 DIAGNOSIS — D6869 Other thrombophilia: Secondary | ICD-10-CM | POA: Diagnosis not present

## 2021-06-01 DIAGNOSIS — I4891 Unspecified atrial fibrillation: Secondary | ICD-10-CM | POA: Insufficient documentation

## 2021-06-01 DIAGNOSIS — Z79899 Other long term (current) drug therapy: Secondary | ICD-10-CM | POA: Insufficient documentation

## 2021-06-01 DIAGNOSIS — Z7901 Long term (current) use of anticoagulants: Secondary | ICD-10-CM | POA: Insufficient documentation

## 2021-06-01 DIAGNOSIS — E890 Postprocedural hypothyroidism: Secondary | ICD-10-CM | POA: Insufficient documentation

## 2021-06-01 DIAGNOSIS — Z853 Personal history of malignant neoplasm of breast: Secondary | ICD-10-CM | POA: Insufficient documentation

## 2021-06-01 DIAGNOSIS — I771 Stricture of artery: Secondary | ICD-10-CM | POA: Insufficient documentation

## 2021-06-01 NOTE — Progress Notes (Signed)
Primary Care Physician: Chevis Pretty, FNP Referring Physician:Dr. Phineas Inches   Angela Dean is a 65 y.o. female with a h/o Angela Dean is a 65 y.o. female with a hx of atrial fibrillation CHADS2VASC 2 on aspirin, Thyrod nodule s/p thyroidectomy, breast cancer mastectomy on the L side, no radiation, no chemo   She is in the afib clinic for evaluation after being seen by Dr. Harl Bowie 10/31 for afib. She has RVR today at 115 bpm. She is asymptomatic. It appears that she is persistent. Al thought she has a CHA2DS2-VASc Score = 2, I discussed with pt to pursue restoring SR, it will likely mean pursing cardioversion and for this she will need to be on anticoagulation for at least 21 days prior to this. She does not drink alcohol, smoke, or snore.   F/u in afib clinic, 06/02/21. She remains in rate controlled afib. We discussed risk vrs benefit of pursing cardioversion and she would like to pursue. She did have a doppler for decreased blood flow to rt arm and by doppler was found to have an obstruction. She was directed to the ER for CT and the report read  rt subclavian may be severely stenotic or occluded( age indeterminate) but pt states her BP has not been audible in her rt arm for some time. She had her BB increased in the ER for elevated BP and was told to continue her eliquis.   We discussed pursing CV, risk vrs benefit  and she is in agreement. No missed anticoagulation since starting drug and the cardioversion are running almost 3 weeks out. Recent echo showed low normal EF with bilateral atria enlargement,   Today, she denies symptoms of palpitations, chest pain, shortness of breath, orthopnea, PND, lower extremity edema, dizziness, presyncope, syncope, or neurologic sequela. The patient is tolerating medications without difficulties and is otherwise without complaint today.   Past Medical History:  Diagnosis Date   Atrial fibrillation ([place])    Breast cancer ([place])    Cancer ([place])     Thyroid disease    Past Surgical History:  Procedure Laterality Date   MASTECTOMY Left 1997    Current Outpatient Medications  Medication Sig Dispense Refill   apixaban (ELIQUIS) 5 MG TABS tablet Take 1 tablet (5 mg total) by mouth 2 (two) times daily. 60 tablet 3   levothyroxine (SYNTHROID) 100 MCG tablet Take 1 tablet (100 mcg total) by mouth daily before breakfast. 30 tablet 11   metoprolol succinate (TOPROL XL) 50 MG 24 hr tablet Take 3 tablets (150 mg total) by mouth daily. Take with or immediately following a meal. 14 tablet 1   No current facility-administered medications for this encounter.    No Known Allergies  Social History   Socioeconomic History   Marital status: Divorced    Spouse name: Not on file   Number of children: Not on file   Years of education: Not on file   Highest education level: Not on file  Occupational History   Not on file  Tobacco Use   Smoking status: Never   Smokeless tobacco: Never  Substance and Sexual Activity   Alcohol use: No   Drug use: No   Sexual activity: Not on file  Other Topics Concern   Not on file  Social History Narrative   Not on file   Social Determinants of Health   Financial Resource Strain: Not on file  Food Insecurity: Not on file  Transportation Needs: Not on file  Physical Activity: Not on file  Stress: Not on file  Social Connections: Not on file  Intimate Partner Violence: Not on file    Family History  Problem Relation Age of Onset   Heart disease Mother    Hypertension Mother    Cancer Sister    Cancer Sister    Cancer Sister     ROS- All systems are reviewed and negative except as per the HPI above  Physical Exam: Vitals:   06/01/21 1045  Pulse: 82  Weight: 70.9 kg  Height: 5\' 4"  (1.626 m)   Wt Readings from Last 3 Encounters:  06/01/21 70.9 kg  05/23/21 70.3 kg  05/16/21 71.2 kg    Labs: Lab Results  Component Value Date   NA 141 05/23/2021   K 4.2 05/23/2021   CL 106  05/23/2021   CO2 27 05/23/2021   GLUCOSE 100 (H) 05/23/2021   BUN 9 05/23/2021   CREATININE 0.72 05/23/2021   CALCIUM 10.3 05/23/2021   No results found for: INR Lab Results  Component Value Date   CHOL 201 (H) 11/29/2020   HDL 76 11/29/2020   LDLCALC 112 (H) 11/29/2020   TRIG 72 11/29/2020     GEN- The patient is well appearing, alert and oriented x 3 today.   Head- normocephalic, atraumatic Eyes-  Sclera clear, conjunctiva pink Ears- hearing intact Oropharynx- clear Neck- supple, no JVP Lymph- no cervical lymphadenopathy Lungs- Clear to ausculation bilaterally, normal work of breathing Heart- Regular rate and rhythm, no murmurs, rubs or gallops, PMI not laterally displaced GI- soft, NT, ND, + BS Extremities- no clubbing, cyanosis, or edema, no palpable pulse rt radial pulse  MS- no significant deformity or atrophy Skin- no rash or lesion Psych- euthymic mood, full affect Neuro- strength and sensation are intact  EKG-afib with  RVR at 115 bpm  Echo-  05/16/2021 1. Left ventricular ejection fraction, by estimation, is 50% with beat to  beat variablity in afib. The left ventricle has low normal function. The  left ventricle has no regional wall motion abnormalities. There is mild  left ventricular hypertrophy. Left   ventricular diastolic parameters are indeterminate.   2. Right ventricular systolic function is normal. The right ventricular  size is normal. There is normal pulmonary artery systolic pressure. The  estimated right ventricular systolic pressure is 81.1 mmHg.   3. Left atrial size was severely dilated.   4. Right atrial size was severely dilated.   5. The mitral valve is grossly normal. Mild mitral valve regurgitation.  No evidence of mitral stenosis.   6. The aortic valve is tricuspid. There is mild thickening of the aortic  valve. Aortic valve regurgitation is mild. No aortic stenosis is present.   7. The inferior vena cava is normal in size with  greater than 50%  respiratory variability, suggesting right atrial pressure of 3 mmHg.   Assessment and Plan:  1. Afib  Recent dx General education re afib Risk factors reviewed  Continue metoprolol succinate 150 mg daily, this was increased in the ER recently for BP control   Will schedule for cardioversion 12/27 Cbc/bmet   2. CHA2DS2VASc  score of 2  Continue eliquis 5 mg bid, no missed doses since starting    3. Reduced pulses rt arm  Recent evaluation with U/S and CT Areas of rt stenosis of rt subclavian artery measuring up to 60% Appears to be chronic per pt  F/u with Dr. Harl Bowie    F/u afib clinic after cardioversion  Angela Dean, [facility] [address] [place], [place] [address] [phone]

## 2021-06-01 NOTE — Patient Instructions (Signed)
Cardioversion scheduled for Tuesday, December 27th  - come to afib clinic for labs at [place] at the [facility] and go to admitting at 1030AM  - Do not eat or drink anything after midnight the night prior to your procedure.  - Take all your morning medication (except diabetic medications) with a sip of water prior to arrival.  - You will not be able to drive home after your procedure.  - Do NOT miss any doses of your blood thinner - if you should miss a dose please notify our office immediately.  - If you feel as if you go back into normal rhythm prior to scheduled cardioversion, please notify our office immediately. If your procedure is canceled in the cardioversion suite you will be charged a cancellation fee. Patients will be asked to: to mask in public and hand hygiene (no longer quarantine) in the 3 days prior to surgery, to report if any COVID-19-like illness or household contacts to COVID-19 to determine need for testing

## 2021-06-01 NOTE — H&P (View-Only) (Signed)
Primary Care Physician: Chevis Pretty, FNP Referring Physician:Dr. Phineas Inches   Angela Dean is a 65 y.o. female with a h/o Angela Dean is a 65 y.o. female with a hx of atrial fibrillation CHADS2VASC 2 on aspirin, Thyrod nodule s/p thyroidectomy, breast cancer mastectomy on the L side, no radiation, no chemo   She is in the afib clinic for evaluation after being seen by Dr. Harl Bowie 10/31 for afib. She has RVR today at 115 bpm. She is asymptomatic. It appears that she is persistent. Al thought she has a CHA2DS2-VASc Score = 2, I discussed with pt to pursue restoring SR, it will likely mean pursing cardioversion and for this she will need to be on anticoagulation for at least 21 days prior to this. She does not drink alcohol, smoke, or snore.   F/u in afib clinic, 06/02/21. She remains in rate controlled afib. We discussed risk vrs benefit of pursing cardioversion and she would like to pursue. She did have a doppler for decreased blood flow to rt arm and by doppler was found to have an obstruction. She was directed to the ER for CT and the report read  rt subclavian may be severely stenotic or occluded( age indeterminate) but pt states her BP has not been audible in her rt arm for some time. She had her BB increased in the ER for elevated BP and was told to continue her eliquis.   We discussed pursing CV, risk vrs benefit  and she is in agreement. No missed anticoagulation since starting drug and the cardioversion are running almost 3 weeks out. Recent echo showed low normal EF with bilateral atria enlargement,   Today, she denies symptoms of palpitations, chest pain, shortness of breath, orthopnea, PND, lower extremity edema, dizziness, presyncope, syncope, or neurologic sequela. The patient is tolerating medications without difficulties and is otherwise without complaint today.   Past Medical History:  Diagnosis Date   Atrial fibrillation ([place])    Breast cancer ([place])    Cancer ([place])     Thyroid disease    Past Surgical History:  Procedure Laterality Date   MASTECTOMY Left 1997    Current Outpatient Medications  Medication Sig Dispense Refill   apixaban (ELIQUIS) 5 MG TABS tablet Take 1 tablet (5 mg total) by mouth 2 (two) times daily. 60 tablet 3   levothyroxine (SYNTHROID) 100 MCG tablet Take 1 tablet (100 mcg total) by mouth daily before breakfast. 30 tablet 11   metoprolol succinate (TOPROL XL) 50 MG 24 hr tablet Take 3 tablets (150 mg total) by mouth daily. Take with or immediately following a meal. 14 tablet 1   No current facility-administered medications for this encounter.    No Known Allergies  Social History   Socioeconomic History   Marital status: Divorced    Spouse name: Not on file   Number of children: Not on file   Years of education: Not on file   Highest education level: Not on file  Occupational History   Not on file  Tobacco Use   Smoking status: Never   Smokeless tobacco: Never  Substance and Sexual Activity   Alcohol use: No   Drug use: No   Sexual activity: Not on file  Other Topics Concern   Not on file  Social History Narrative   Not on file   Social Determinants of Health   Financial Resource Strain: Not on file  Food Insecurity: Not on file  Transportation Needs: Not on file  Physical Activity: Not on file  Stress: Not on file  Social Connections: Not on file  Intimate Partner Violence: Not on file    Family History  Problem Relation Age of Onset   Heart disease Mother    Hypertension Mother    Cancer Sister    Cancer Sister    Cancer Sister     ROS- All systems are reviewed and negative except as per the HPI above  Physical Exam: Vitals:   06/01/21 1045  Pulse: 82  Weight: 70.9 kg  Height: 5\' 4"  (1.626 m)   Wt Readings from Last 3 Encounters:  06/01/21 70.9 kg  05/23/21 70.3 kg  05/16/21 71.2 kg    Labs: Lab Results  Component Value Date   NA 141 05/23/2021   K 4.2 05/23/2021   CL 106  05/23/2021   CO2 27 05/23/2021   GLUCOSE 100 (H) 05/23/2021   BUN 9 05/23/2021   CREATININE 0.72 05/23/2021   CALCIUM 10.3 05/23/2021   No results found for: INR Lab Results  Component Value Date   CHOL 201 (H) 11/29/2020   HDL 76 11/29/2020   LDLCALC 112 (H) 11/29/2020   TRIG 72 11/29/2020     GEN- The patient is well appearing, alert and oriented x 3 today.   Head- normocephalic, atraumatic Eyes-  Sclera clear, conjunctiva pink Ears- hearing intact Oropharynx- clear Neck- supple, no JVP Lymph- no cervical lymphadenopathy Lungs- Clear to ausculation bilaterally, normal work of breathing Heart- Regular rate and rhythm, no murmurs, rubs or gallops, PMI not laterally displaced GI- soft, NT, ND, + BS Extremities- no clubbing, cyanosis, or edema, no palpable pulse rt radial pulse  MS- no significant deformity or atrophy Skin- no rash or lesion Psych- euthymic mood, full affect Neuro- strength and sensation are intact  EKG-afib with  RVR at 115 bpm  Echo-  05/16/2021 1. Left ventricular ejection fraction, by estimation, is 50% with beat to  beat variablity in afib. The left ventricle has low normal function. The  left ventricle has no regional wall motion abnormalities. There is mild  left ventricular hypertrophy. Left   ventricular diastolic parameters are indeterminate.   2. Right ventricular systolic function is normal. The right ventricular  size is normal. There is normal pulmonary artery systolic pressure. The  estimated right ventricular systolic pressure is 76.7 mmHg.   3. Left atrial size was severely dilated.   4. Right atrial size was severely dilated.   5. The mitral valve is grossly normal. Mild mitral valve regurgitation.  No evidence of mitral stenosis.   6. The aortic valve is tricuspid. There is mild thickening of the aortic  valve. Aortic valve regurgitation is mild. No aortic stenosis is present.   7. The inferior vena cava is normal in size with  greater than 50%  respiratory variability, suggesting right atrial pressure of 3 mmHg.   Assessment and Plan:  1. Afib  Recent dx General education re afib Risk factors reviewed  Continue metoprolol succinate 150 mg daily, this was increased in the ER recently for BP control   Will schedule for cardioversion 12/27 Cbc/bmet   2. CHA2DS2VASc  score of 2  Continue eliquis 5 mg bid, no missed doses since starting    3. Reduced pulses rt arm  Recent evaluation with U/S and CT Areas of rt stenosis of rt subclavian artery measuring up to 60% Appears to be chronic per pt  F/u with Dr. Harl Bowie    F/u afib clinic after cardioversion  Geroge Baseman Tyrie Porzio, [facility] [address] [place], [place] [address] [phone]

## 2021-06-07 ENCOUNTER — Encounter (HOSPITAL_COMMUNITY): Payer: Self-pay | Admitting: Cardiovascular Disease

## 2021-06-07 NOTE — Progress Notes (Signed)
Attempted to obtain medical history via telephone, unable to reach at this time. I left a voicemail to return pre surgical testing department's phone call.  

## 2021-06-15 NOTE — Telephone Encounter (Signed)
Attempted to call patient, left message for patient to call back to office.   

## 2021-06-16 ENCOUNTER — Telehealth: Payer: Self-pay | Admitting: Internal Medicine

## 2021-06-16 NOTE — Telephone Encounter (Signed)
Patient states she received a call yesterday from our office for an appt with Dr. Harl Bowie.  I don't see any notes.  I see she has one for 2/7 with Dr. Harl Bowie.

## 2021-06-16 NOTE — Telephone Encounter (Signed)
Called pt, unable to get an answer. Received a busy dial tone. Called twice.

## 2021-06-20 ENCOUNTER — Ambulatory Visit (HOSPITAL_COMMUNITY): Payer: BC Managed Care – PPO | Admitting: Anesthesiology

## 2021-06-20 ENCOUNTER — Encounter (HOSPITAL_COMMUNITY)
Admission: RE | Disposition: A | Discharge status: Home / Self Care | Payer: Self-pay | Source: Home / Self Care | Attending: Cardiovascular Disease

## 2021-06-20 ENCOUNTER — Encounter (HOSPITAL_COMMUNITY): Payer: Self-pay | Admitting: Cardiovascular Disease

## 2021-06-20 ENCOUNTER — Ambulatory Visit (HOSPITAL_COMMUNITY)
Admission: RE | Admit: 2021-06-20 | Discharge: 2021-06-20 | Disposition: A | Discharge status: Home / Self Care | Payer: BC Managed Care – PPO | Source: Ambulatory Visit | Attending: Nurse Practitioner | Admitting: Nurse Practitioner

## 2021-06-20 ENCOUNTER — Ambulatory Visit (HOSPITAL_COMMUNITY)
Admission: RE | Admit: 2021-06-20 | Discharge: 2021-06-20 | Disposition: A | Discharge status: Home / Self Care | Payer: BC Managed Care – PPO | Attending: Cardiovascular Disease | Admitting: Cardiovascular Disease

## 2021-06-20 ENCOUNTER — Other Ambulatory Visit: Payer: Self-pay

## 2021-06-20 DIAGNOSIS — Z853 Personal history of malignant neoplasm of breast: Secondary | ICD-10-CM | POA: Diagnosis not present

## 2021-06-20 DIAGNOSIS — Z7901 Long term (current) use of anticoagulants: Secondary | ICD-10-CM | POA: Diagnosis not present

## 2021-06-20 DIAGNOSIS — I4891 Unspecified atrial fibrillation: Secondary | ICD-10-CM | POA: Insufficient documentation

## 2021-06-20 DIAGNOSIS — E039 Hypothyroidism, unspecified: Secondary | ICD-10-CM | POA: Insufficient documentation

## 2021-06-20 HISTORY — PX: CARDIOVERSION: SHX1299

## 2021-06-20 LAB — CBC
HCT: 44 % (ref 36.0–46.0)
Hemoglobin: 14.3 g/dL (ref 12.0–15.0)
MCH: 29.4 pg (ref 26.0–34.0)
MCHC: 32.5 g/dL (ref 30.0–36.0)
MCV: 90.3 fL (ref 80.0–100.0)
Platelets: 211 10*3/uL (ref 150–400)
RBC: 4.87 MIL/uL (ref 3.87–5.11)
RDW: 13.2 % (ref 11.5–15.5)
WBC: 6.1 10*3/uL (ref 4.0–10.5)
nRBC: 0 % (ref 0.0–0.2)

## 2021-06-20 LAB — BASIC METABOLIC PANEL
Anion gap: 8 (ref 5–15)
BUN: 11 mg/dL (ref 8–23)
CO2: 25 mmol/L (ref 22–32)
Calcium: 9.6 mg/dL (ref 8.9–10.3)
Chloride: 107 mmol/L (ref 98–111)
Creatinine, Ser: 0.84 mg/dL (ref 0.44–1.00)
GFR, Estimated: >60 mL/min (ref >60)
Glucose, Bld: 98 mg/dL (ref 70–99)
Potassium: 4.5 mmol/L (ref 3.5–5.1)
Sodium: 140 mmol/L (ref 135–145)

## 2021-06-20 SURGERY — CARDIOVERSION
Anesthesia: General

## 2021-06-20 MED ORDER — PROPOFOL 10 MG/ML IV BOLUS
INTRAVENOUS | Status: DC | PRN
  Administered 2021-06-20: 70 mg via INTRAVENOUS

## 2021-06-20 MED ORDER — LIDOCAINE 2% (20 MG/ML) 5 ML SYRINGE
INTRAMUSCULAR | Status: DC | PRN
  Administered 2021-06-20: 100 mg via INTRAVENOUS

## 2021-06-20 MED ORDER — SODIUM CHLORIDE 0.9 % IV SOLN: INTRAVENOUS | Status: DC

## 2021-06-20 NOTE — CV Procedure (Signed)
° °  DIRECT CURRENT CARDIOVERSION  NAME:  Angela Dean    MRN: [id number] DOB:  [birth year]    ADMIT DATE: 06/20/2021  Indication:  Symptomatic atrial fibrillation  Procedure Note:  The patient signed informed consent.  They have had had therapeutic anticoagulation with eliquis greater than 3 weeks.  Anesthesia was administered by Dr. Tobias Alexander.  Adequate airway was maintained throughout and vital followed per protocol.  They were cardioverted x 3 with 200J of biphasic synchronized energy.  The patient did not convert to NSR despite 3 separate attempts with pad repositioning and direct AP pressure. There was not any evidence of brief sinus rhythm and remained in Afib the entire time. The patient had normal neuro status and respiratory status post procedure with vitals stable as recorded elsewhere.    Follow up: They will continue on current medical therapy and follow up with cardiology as scheduled.  [place] Bells T. Audie Box, MD, [place]  [address] [place], [place] [address] [phone]  11:12 AM

## 2021-06-20 NOTE — Interval H&P Note (Signed)
History and Physical Interval Note:  06/20/2021 10:48 AM  Angela Dean  has presented today for surgery, with the diagnosis of AFIB.  The various methods of treatment have been discussed with the patient and family. After consideration of risks, benefits and other options for treatment, the patient has consented to  Procedure(s): CARDIOVERSION (N/A) as a surgical intervention.  The patient's history has been reviewed, patient examined, no change in status, stable for surgery.  I have reviewed the patient's chart and labs.  Questions were answered to the patient's satisfaction.    NPO for DCCV. On eliquis >3 weeks. No missed doses.   Shared Decision Making/Informed Consent The risks (stroke, cardiac arrhythmias rarely resulting in the need for a temporary or permanent pacemaker, skin irritation or burns and complications associated with conscious sedation including aspiration, arrhythmia, respiratory failure and death), benefits (restoration of normal sinus rhythm) and alternatives of a direct current cardioversion were explained in detail to Angela Dean and she agrees to proceed.   [place] Bells T. Audie Box, MD, [place]  [address] [place], [place] [address] [phone]  10:48 AM

## 2021-06-20 NOTE — Anesthesia Postprocedure Evaluation (Signed)
Anesthesia Post Note  Patient: Angela Dean  Procedure(s) Performed: CARDIOVERSION     Patient location during evaluation: Endoscopy Anesthesia Type: General Level of consciousness: sedated Pain management: pain level controlled Vital Signs Assessment: post-procedure vital signs reviewed and stable Respiratory status: spontaneous breathing and respiratory function stable Cardiovascular status: stable Postop Assessment: no apparent nausea or vomiting Anesthetic complications: no   No notable events documented.  Last Vitals:  Vitals:   06/20/21 1131 06/20/21 1138  BP: (!) 99/40 98/70  Pulse: 92 87  Resp: 14 16  Temp:    SpO2: 96% 95%    Last Pain:  Vitals:   06/20/21 1138  TempSrc:   PainSc: 0-No pain                 [name]

## 2021-06-20 NOTE — Transfer of Care (Signed)
Immediate Anesthesia Transfer of Care Note  Patient: Angela Dean  Procedure(s) Performed: CARDIOVERSION  Patient Location: PACU  Anesthesia Type:MAC  Level of Consciousness: drowsy and patient cooperative  Airway & Oxygen Therapy: Patient Spontanous Breathing  Post-op Assessment: Report given to RN and Post -op Vital signs reviewed and stable  Post vital signs: Reviewed and stable  Last Vitals:  Vitals Value Taken Time  BP    Temp    Pulse    Resp    SpO2      Last Pain:  Vitals:   06/20/21 1040  TempSrc: Temporal  PainSc: 0-No pain         Complications: No notable events documented.

## 2021-06-20 NOTE — Discharge Instructions (Signed)

## 2021-06-20 NOTE — Anesthesia Preprocedure Evaluation (Addendum)
Anesthesia Evaluation  Patient identified by MRN, date of birth, ID band Patient awake    Reviewed: Allergy & Precautions, NPO status , Patient's Chart, lab work & pertinent test results  History of Anesthesia Complications Negative for: history of anesthetic complications  Airway Mallampati: II  TM Distance: >3 FB Neck ROM: Full    Dental  (+) Partial Lower, Dental Advisory Given   Pulmonary neg pulmonary ROS,    Pulmonary exam normal        Cardiovascular + dysrhythmias Atrial Fibrillation  Rhythm:Irregular Rate:Normal  IMPRESSIONS    1. Left ventricular ejection fraction, by estimation, is 50% with beat to  beat variablity in afib. The left ventricle has low normal function. The  left ventricle has no regional wall motion abnormalities. There is mild  left ventricular hypertrophy. Left  ventricular diastolic parameters are indeterminate.  2. Right ventricular systolic function is normal. The right ventricular  size is normal. There is normal pulmonary artery systolic pressure. The  estimated right ventricular systolic pressure is 26.2 mmHg.  3. Left atrial size was severely dilated.  4. Right atrial size was severely dilated.  5. The mitral valve is grossly normal. Mild mitral valve regurgitation.  No evidence of mitral stenosis.  6. The aortic valve is tricuspid. There is mild thickening of the aortic  valve. Aortic valve regurgitation is mild. No aortic stenosis is present.  7. The inferior vena cava is normal in size with greater than 50%  respiratory variability, suggesting right atrial pressure of 3 mmHg.    Neuro/Psych negative neurological ROS     GI/Hepatic negative GI ROS, Neg liver ROS,   Endo/Other  Hypothyroidism   Renal/GU negative Renal ROS     Musculoskeletal negative musculoskeletal ROS (+)   Abdominal   Peds  Hematology negative hematology ROS (+)   Anesthesia Other Findings    Reproductive/Obstetrics                            Anesthesia Physical Anesthesia Plan  ASA: 2  Anesthesia Plan: General   Post-op Pain Management:    Induction:   PONV Risk Score and Plan: TIVA and Treatment may vary due to age or medical condition  Airway Management Planned: Mask  Additional Equipment:   Intra-op Plan:   Post-operative Plan:   Informed Consent: I have reviewed the patients History and Physical, chart, labs and discussed the procedure including the risks, benefits and alternatives for the proposed anesthesia with the patient or authorized representative who has indicated his/her understanding and acceptance.     Dental advisory given  Plan Discussed with: Anesthesiologist and CRNA  Anesthesia Plan Comments:         Anesthesia Quick Evaluation

## 2021-06-21 ENCOUNTER — Encounter (HOSPITAL_COMMUNITY): Payer: Self-pay | Admitting: Cardiovascular Disease

## 2021-06-23 ENCOUNTER — Other Ambulatory Visit (HOSPITAL_COMMUNITY): Payer: Self-pay | Admitting: *Deleted

## 2021-06-23 ENCOUNTER — Other Ambulatory Visit: Payer: Self-pay | Admitting: Nurse Practitioner

## 2021-06-23 MED ORDER — METOPROLOL SUCCINATE ER 50 MG PO TB24: 150.0000 mg | ORAL_TABLET | Freq: Every day | ORAL | 3 refills | Status: DC

## 2021-06-23 NOTE — Telephone Encounter (Signed)
Had cardioversion 12-27

## 2021-06-28 ENCOUNTER — Ambulatory Visit (HOSPITAL_COMMUNITY)
Admission: RE | Admit: 2021-06-28 | Discharge: 2021-06-28 | Disposition: A | Discharge status: Home / Self Care | Payer: BC Managed Care – PPO | Source: Ambulatory Visit | Attending: Physician Assistant | Admitting: Physician Assistant

## 2021-06-28 ENCOUNTER — Telehealth: Payer: Self-pay | Admitting: Pharmacist

## 2021-06-28 ENCOUNTER — Other Ambulatory Visit: Payer: Self-pay

## 2021-06-28 ENCOUNTER — Encounter (HOSPITAL_COMMUNITY): Payer: Self-pay | Admitting: Physician Assistant

## 2021-06-28 VITALS — BP 134/98 | HR 94 | Ht 64.0 in | Wt 158.4 lb

## 2021-06-28 DIAGNOSIS — E890 Postprocedural hypothyroidism: Secondary | ICD-10-CM | POA: Diagnosis not present

## 2021-06-28 DIAGNOSIS — I4819 Other persistent atrial fibrillation: Secondary | ICD-10-CM | POA: Insufficient documentation

## 2021-06-28 DIAGNOSIS — Z853 Personal history of malignant neoplasm of breast: Secondary | ICD-10-CM | POA: Insufficient documentation

## 2021-06-28 DIAGNOSIS — D6869 Other thrombophilia: Secondary | ICD-10-CM

## 2021-06-28 DIAGNOSIS — Z7982 Long term (current) use of aspirin: Secondary | ICD-10-CM | POA: Insufficient documentation

## 2021-06-28 DIAGNOSIS — Z79899 Other long term (current) drug therapy: Secondary | ICD-10-CM | POA: Insufficient documentation

## 2021-06-28 DIAGNOSIS — Z7901 Long term (current) use of anticoagulants: Secondary | ICD-10-CM | POA: Insufficient documentation

## 2021-06-28 LAB — MAGNESIUM: Magnesium: 2.3 mg/dL (ref 1.7–2.4)

## 2021-06-28 NOTE — Progress Notes (Signed)
Primary Care Physician: Chevis Pretty, FNP Referring Physician:Dr. Phineas Inches   Angela Dean is a 66 y.o. female with a h/o Angela Dean is a 66 y.o. female with a hx of atrial fibrillation CHADS2VASC 2 on aspirin, Thyrod nodule s/p thyroidectomy, breast cancer mastectomy on the L side, no radiation, no chemo   She is in the afib clinic for evaluation after being seen by Dr. Harl Bowie 10/31 for afib. She has RVR today at 115 bpm. She is asymptomatic. It appears that she is persistent. Al thought she has a CHA2DS2-VASc Score = 2, I discussed with pt to pursue restoring SR, it will likely mean pursing cardioversion and for this she will need to be on anticoagulation for at least 21 days prior to this. She does not drink alcohol, smoke, or snore.   F/u in afib clinic, 06/02/21. She remains in rate controlled afib. We discussed risk vrs benefit of pursing cardioversion and she would like to pursue. She did have a doppler for decreased blood flow to rt arm and by doppler was found to have an obstruction. She was directed to the ER for CT and the report read  rt subclavian may be severely stenotic or occluded( age indeterminate) but pt states her BP has not been audible in her rt arm for some time. She had her BB increased in the ER for elevated BP and was told to continue her eliquis.   We discussed pursing CV, risk vrs benefit  and she is in agreement. No missed anticoagulation since starting drug and the cardioversion are running almost 3 weeks out. Recent echo showed low normal EF with bilateral atria enlargement.  Follow up in the AF clinic 06/28/21. Patient is s/p DCCV on 06/20/21 which was unsuccessful after 3 attempts. Per procedure report, no sinus beats after any of the attempts. She is in rate controlled afib today. Remains asymptomatic.    Today, she denies symptoms of palpitations, chest pain, shortness of breath, orthopnea, PND, lower extremity edema, dizziness, presyncope, syncope, or  neurologic sequela. The patient is tolerating medications without difficulties and is otherwise without complaint today.   Past Medical History:  Diagnosis Date   Atrial fibrillation ([place])    Breast cancer ([place])    Cancer ([place])    Thyroid disease    Past Surgical History:  Procedure Laterality Date   CARDIOVERSION N/A 06/20/2021   Procedure: CARDIOVERSION;  Surgeon: Geralynn Rile, MD;  Location: [facility];  Service: Cardiovascular;  Laterality: N/A;   MASTECTOMY Left 1997    Current Outpatient Medications  Medication Sig Dispense Refill   apixaban (ELIQUIS) 5 MG TABS tablet Take 1 tablet (5 mg total) by mouth 2 (two) times daily. 60 tablet 3   levothyroxine (SYNTHROID) 100 MCG tablet Take 1 tablet (100 mcg total) by mouth daily before breakfast. 30 tablet 11   metoprolol succinate (TOPROL XL) 50 MG 24 hr tablet Take 3 tablets (150 mg total) by mouth daily. Take with or immediately following a meal. 90 tablet 3   No current facility-administered medications for this encounter.    No Known Allergies  Social History   Socioeconomic History   Marital status: Divorced    Spouse name: Not on file   Number of children: Not on file   Years of education: Not on file   Highest education level: Not on file  Occupational History   Not on file  Tobacco Use   Smoking status: Never   Smokeless tobacco: Never  Substance and Sexual Activity   Alcohol use: No   Drug use: No   Sexual activity: Not on file  Other Topics Concern   Not on file  Social History Narrative   Not on file   Social Determinants of Health   Financial Resource Strain: Not on file  Food Insecurity: Not on file  Transportation Needs: Not on file  Physical Activity: Not on file  Stress: Not on file  Social Connections: Not on file  Intimate Partner Violence: Not on file    Family History  Problem Relation Age of Onset   Heart disease Mother    Hypertension Mother    Cancer Sister    Cancer  Sister    Cancer Sister     ROS- All systems are reviewed and negative except as per the HPI above  Physical Exam: Vitals:   06/28/21 1051  BP: (!) 134/98  Pulse: 94  Weight: 71.8 kg  Height: 5\' 4"  (1.626 m)    Wt Readings from Last 3 Encounters:  06/28/21 71.8 kg  06/20/21 70.3 kg  06/01/21 70.9 kg    Labs: Lab Results  Component Value Date   NA 140 06/20/2021   K 4.5 06/20/2021   CL 107 06/20/2021   CO2 25 06/20/2021   GLUCOSE 98 06/20/2021   BUN 11 06/20/2021   CREATININE 0.84 06/20/2021   CALCIUM 9.6 06/20/2021   No results found for: INR Lab Results  Component Value Date   CHOL 201 (H) 11/29/2020   HDL 76 11/29/2020   LDLCALC 112 (H) 11/29/2020   TRIG 72 11/29/2020    GEN- The patient is a well appearing female, alert and oriented x 3 today.   HEENT-head normocephalic, atraumatic, sclera clear, conjunctiva pink, hearing intact, trachea midline. Lungs- Clear to ausculation bilaterally, normal work of breathing Heart- irregular rate and rhythm, no murmurs, rubs or gallops  GI- soft, NT, ND, + BS Extremities- no clubbing, cyanosis, or edema MS- no significant deformity or atrophy Skin- no rash or lesion Psych- euthymic mood, full affect Neuro- strength and sensation are intact   EKG- afib Vent. rate 94 BPM PR interval * ms QRS duration 82 ms QT/QTcB 354/442 ms  Echo-  05/16/2021 1. Left ventricular ejection fraction, by estimation, is 50% with beat to beat variablity in afib. The left ventricle has low normal function. The left ventricle has no regional wall motion abnormalities. There is mild left ventricular hypertrophy. Left  ventricular diastolic parameters are indeterminate.   2. Right ventricular systolic function is normal. The right ventricular size is normal. There is normal pulmonary artery systolic pressure. The estimated right ventricular systolic pressure is 16.1 mmHg.   3. Left atrial size was severely dilated.   4. Right atrial size was  severely dilated.   5. The mitral valve is grossly normal. Mild mitral valve regurgitation. No evidence of mitral stenosis.   6. The aortic valve is tricuspid. There is mild thickening of the aortic valve. Aortic valve regurgitation is mild. No aortic stenosis is present.   7. The inferior vena cava is normal in size with greater than 50% respiratory variability, suggesting right atrial pressure of 3 mmHg.   Assessment and Plan:  1. Persistent Afib  S/p DCCV on 06/20/21 which was unsuccessful. We discussed rate vs rhythm control. Given her young age, would not favor rate control. Given the length of time she has been in afib and her severe biatrial enlargement, I think dofetilide would be the best AAD option.  Patient is agreeable to dofetilide admission.  Check mag today. Continue Eliquis 5 mg BID Continue metoprolol succinate 150 mg daily  2. CHA2DS2VASc  score of 2 Continue eliquis 5 mg BID    Patient will call AF clinic back with schedule availability for dofetilide admission. Follow up with Dr Harl Bowie as scheduled.    [facility] [address] [place], [place] [address] [phone]

## 2021-06-28 NOTE — Telephone Encounter (Signed)
Medication list reviewed in anticipation of upcoming Tikosyn initiation. Patient is not taking any contraindicated or QTc prolonging medications.   Patient is anticoagulated on Eliquis on the appropriate dose. Please ensure that patient has not missed any anticoagulation doses in the 3 weeks prior to Tikosyn initiation.   Patient will need to be counseled to avoid use of Benadryl while on Tikosyn and in the 2-3 days prior to Tikosyn initiation.  

## 2021-06-30 ENCOUNTER — Telehealth (HOSPITAL_COMMUNITY): Payer: Self-pay

## 2021-06-30 NOTE — Telephone Encounter (Signed)
Contacted [employer] about initiating a prior authorization for inpatient stay for [facility] preadmission. Authorization is pending for approval. Status submitted as urgent request. Faxed clinical information. Date of service: 07/18/2021  Reference # [id number] Fax # [phone]

## 2021-07-03 ENCOUNTER — Telehealth (HOSPITAL_COMMUNITY): Payer: Self-pay | Admitting: *Deleted

## 2021-07-03 NOTE — Telephone Encounter (Signed)
Inpatient hospitalization approved for 1/24-1/30 auth number [id number]

## 2021-07-18 ENCOUNTER — Encounter (HOSPITAL_COMMUNITY): Payer: Self-pay | Admitting: Physician Assistant

## 2021-07-18 ENCOUNTER — Other Ambulatory Visit: Payer: Self-pay

## 2021-07-18 ENCOUNTER — Inpatient Hospital Stay (HOSPITAL_COMMUNITY)
Admission: AD | Admit: 2021-07-18 | Discharge: 2021-07-21 | DRG: 310 | Disposition: A | Discharge status: Home / Self Care | Payer: BC Managed Care – PPO | Source: Ambulatory Visit | Attending: Cardiology | Admitting: Cardiology

## 2021-07-18 ENCOUNTER — Ambulatory Visit (HOSPITAL_COMMUNITY)
Admission: RE | Admit: 2021-07-18 | Discharge: 2021-07-18 | Disposition: A | Discharge status: Home / Self Care | Payer: BC Managed Care – PPO | Source: Ambulatory Visit | Attending: Physician Assistant | Admitting: Physician Assistant

## 2021-07-18 ENCOUNTER — Other Ambulatory Visit (HOSPITAL_COMMUNITY): Payer: Self-pay

## 2021-07-18 VITALS — BP 138/90 | HR 115 | Ht 64.0 in | Wt 156.4 lb

## 2021-07-18 DIAGNOSIS — Z853 Personal history of malignant neoplasm of breast: Secondary | ICD-10-CM | POA: Diagnosis not present

## 2021-07-18 DIAGNOSIS — Z7901 Long term (current) use of anticoagulants: Secondary | ICD-10-CM

## 2021-07-18 DIAGNOSIS — I4819 Other persistent atrial fibrillation: Secondary | ICD-10-CM | POA: Diagnosis not present

## 2021-07-18 DIAGNOSIS — E890 Postprocedural hypothyroidism: Secondary | ICD-10-CM | POA: Diagnosis not present

## 2021-07-18 DIAGNOSIS — Z9012 Acquired absence of left breast and nipple: Secondary | ICD-10-CM | POA: Diagnosis not present

## 2021-07-18 DIAGNOSIS — E039 Hypothyroidism, unspecified: Secondary | ICD-10-CM | POA: Diagnosis not present

## 2021-07-18 DIAGNOSIS — Z79899 Other long term (current) drug therapy: Secondary | ICD-10-CM | POA: Diagnosis not present

## 2021-07-18 DIAGNOSIS — Z8249 Family history of ischemic heart disease and other diseases of the circulatory system: Secondary | ICD-10-CM | POA: Diagnosis not present

## 2021-07-18 DIAGNOSIS — I4891 Unspecified atrial fibrillation: Secondary | ICD-10-CM | POA: Diagnosis not present

## 2021-07-18 LAB — BASIC METABOLIC PANEL
Anion gap: 7 (ref 5–15)
BUN: 13 mg/dL (ref 8–23)
CO2: 28 mmol/L (ref 22–32)
Calcium: 10 mg/dL (ref 8.9–10.3)
Chloride: 104 mmol/L (ref 98–111)
Creatinine, Ser: 0.88 mg/dL (ref 0.44–1.00)
GFR, Estimated: >60 mL/min (ref >60)
Glucose, Bld: 110 mg/dL — ABNORMAL HIGH (ref 70–99)
Potassium: 4.8 mmol/L (ref 3.5–5.1)
Sodium: 139 mmol/L (ref 135–145)

## 2021-07-18 LAB — MAGNESIUM: Magnesium: 2 mg/dL (ref 1.7–2.4)

## 2021-07-18 LAB — HIV ANTIBODY (ROUTINE TESTING W REFLEX): HIV Screen 4th Generation wRfx: NONREACTIVE

## 2021-07-18 MED ORDER — SODIUM CHLORIDE 0.9 % IV SOLN: 250.0000 mL | INTRAVENOUS | Status: DC | PRN

## 2021-07-18 MED ORDER — SODIUM CHLORIDE 0.9% FLUSH
3.0000 mL | INTRAVENOUS | Status: DC | PRN
  Administered 2021-07-19: 10:00:00 3 mL via INTRAVENOUS

## 2021-07-18 MED ORDER — SODIUM CHLORIDE 0.9% FLUSH
3.0000 mL | Freq: Two times a day (BID) | INTRAVENOUS | Status: DC
  Administered 2021-07-18 – 2021-07-21 (×4): 3 mL via INTRAVENOUS

## 2021-07-18 MED ORDER — MAGNESIUM SULFATE 2 GM/50ML IV SOLN
2.0000 g | Freq: Once | INTRAVENOUS | Status: AC
  Administered 2021-07-18: 17:00:00 2 g via INTRAVENOUS
  Filled 2021-07-18: qty 50

## 2021-07-18 MED ORDER — METOPROLOL SUCCINATE ER 50 MG PO TB24
150.0000 mg | ORAL_TABLET | Freq: Every day | ORAL | Status: DC
  Filled 2021-07-18 (×2): qty 1

## 2021-07-18 MED ORDER — APIXABAN 5 MG PO TABS
5.0000 mg | ORAL_TABLET | Freq: Two times a day (BID) | ORAL | Status: DC
  Administered 2021-07-18 – 2021-07-21 (×6): 5 mg via ORAL
  Filled 2021-07-18 (×6): qty 1

## 2021-07-18 MED ORDER — DOFETILIDE 500 MCG PO CAPS
500.0000 ug | ORAL_CAPSULE | Freq: Two times a day (BID) | ORAL | Status: DC
  Administered 2021-07-18 – 2021-07-20 (×5): 500 ug via ORAL
  Filled 2021-07-18 (×5): qty 1

## 2021-07-18 MED ORDER — LEVOTHYROXINE SODIUM 100 MCG PO TABS
100.0000 ug | ORAL_TABLET | Freq: Every day | ORAL | Status: DC
  Administered 2021-07-19 – 2021-07-21 (×3): 100 ug via ORAL
  Filled 2021-07-18 (×3): qty 1

## 2021-07-18 NOTE — Progress Notes (Signed)
Pharmacy Review for Dofetilide (Tikosyn) Initiation  Admit Complaint: 66 y.o. female admitted 07/18/2021 with atrial fibrillation to be initiated on dofetilide.   Assessment: failed DCCV in 05/2021  Patient Exclusion Criteria: If any screening criteria checked as "Yes", then  patient  should NOT receive dofetilide until criteria item is corrected. If Yes please indicate correction plan.  YES  NO Patient  Exclusion Criteria Correction Plan  [x]  []  Baseline QTc interval is greater than or equal to 440 msec. IF above YES box checked dofetilide contraindicated unless patient has ICD; then may proceed if QTc 500-550 msec or with known ventricular conduction abnormalities may proceed with QTc 550-600 msec. QTc =  464 MD aware  []  [x]  Magnesium level is less than 1.8 mEq/l : Last magnesium:  Lab Results  Component Value Date   MG 2.0 07/18/2021         []  [x]  Potassium level is less than 4 mEq/l : Last potassium:  Lab Results  Component Value Date   K 4.8 07/18/2021         []  [x]  Patient is known or suspected to have a digoxin level greater than 2 ng/ml: No results found for: DIGOXIN    []  [x]  Creatinine clearance less than 20 ml/min (calculated using Cockcroft-Gault, actual body weight and serum creatinine): Estimated Creatinine Clearance: 60.8 mL/min (by C-G formula based on SCr of 0.88 mg/dL).    []  [x]  Patient has received drugs known to prolong the QT intervals within the last 48 hours (phenothiazines, tricyclics or tetracyclic antidepressants, erythromycin, H-1 antihistamines, cisapride, fluoroquinolones, azithromycin). Drugs not listed above may have an, as yet, undetected potential to prolong the QT interval, updated information on QT prolonging agents is available at this website:QT prolonging agents   []  [x]  Patient received a dose of hydrochlorothiazide (Oretic) alone or in any combination including triamterene (Dyazide, Maxzide) in the last 48 hours.   []  [x]  Patient  received a medication known to increase dofetilide plasma concentrations prior to initial dofetilide dose:  Trimethoprim (Primsol, Proloprim) in the last 36 hours Verapamil (Calan, Verelan) in the last 36 hours or a sustained release dose in the last 72 hours Megestrol (Megace) in the last 5 days  Cimetidine (Tagamet) in the last 6 hours Ketoconazole (Nizoral) in the last 24 hours Itraconazole (Sporanox) in the last 48 hours  Prochlorperazine (Compazine) in the last 36 hours    []  [x]  Patient is known to have a history of torsades de pointes; congenital or acquired long QT syndromes.   []  [x]  Patient has received a Class 1 antiarrhythmic with less than 2 half-lives since last dose. (Disopyramide, Quinidine, Procainamide, Lidocaine, Mexiletine, Flecainide, Propafenone)   []  [x]  Patient has received amiodarone therapy in the past 3 months or amiodarone level is greater than 0.3 ng/ml.    Patient has been appropriately anticoagulated with apixaban.  Ordering provider was confirmed at [email or web address] if they are not listed on the [place] Prescribers list.  Goal of Therapy: Follow renal function, electrolytes, potential drug interactions, and dose adjustment. Provide education and 1 week supply at discharge.  Plan:  [x]   Physician selected initial dose within range recommended for patients level of renal function - will monitor for response.  []   Physician selected initial dose outside of range recommended for patients level of renal function - will discuss if the dose should be altered at this time.   Select One Calculated CrCl  Dose q12h  [x]  > 60 ml/min 500 mcg  []  40-60  ml/min 250 mcg  []  20-40 ml/min 125 mcg   2. Follow up QTc after the first 5 doses, renal function, electrolytes (K & Mg) daily x 3 days, dose adjustment, success of initiation and facilitate 1 week discharge supply as clinically indicated.  3. Initiate Tikosyn education video (Call [phone] and ask for video  # 116).  4. Place Enrollment Form on the chart for discharge supply of dofetilide.  Thank you for involving pharmacy in this patient's care.  Renold Genta, PharmD, BCPS Clinical Pharmacist Clinical phone for 07/18/2021 until 3p is [phone] 07/18/2021 1:35 PM  **Pharmacist phone directory can be found on [email or web address] listed under [facility]**

## 2021-07-18 NOTE — TOC Benefit Eligibility Note (Addendum)
Patient Teacher, English as a foreign language completed.    The patient is currently admitted and upon discharge could be taking dofetilide (Tikosyn) 500 mcg.  The current 30 day co-pay is, $45.71.   The patient is insured through [employer] [place], [place] Patient Advocate Specialist [place] Patient Advocate Team Direct Number: [phone]  Fax: [phone]

## 2021-07-18 NOTE — H&P (Addendum)
Primary Care Physician: Chevis Pretty, FNP Referring Physician:Dr. Phineas Inches   Angela Dean is a 66 y.o. female with a h/o Angela Dean is a 66 y.o. female with a hx of atrial fibrillation CHADS2VASC 2 on aspirin, Thyrod nodule s/p thyroidectomy, breast cancer mastectomy on the L side, no radiation, no chemo   She is in the afib clinic for evaluation after being seen by Dr. Harl Bowie 10/31 for afib. She has RVR today at 115 bpm. She is asymptomatic. It appears that she is persistent. Al thought she has a CHA2DS2-VASc Score = 2, I discussed with pt to pursue restoring SR, it Angela Dean likely mean pursing cardioversion and for this she Angela Dean need to be on anticoagulation for at least 21 days prior to this. She does not drink alcohol, smoke, or snore.   F/u in afib clinic, 06/02/21. She remains in rate controlled afib. We discussed risk vrs benefit of pursing cardioversion and she would like to pursue. She did have a doppler for decreased blood flow to rt arm and by doppler was found to have an obstruction. She was directed to the ER for CT and the report read  rt subclavian may be severely stenotic or occluded( age indeterminate) but pt states her BP has not been audible in her rt arm for some time. She had her BB increased in the ER for elevated BP and was told to continue her eliquis.   We discussed pursing CV, risk vrs benefit  and she is in agreement. No missed anticoagulation since starting drug and the cardioversion are running almost 3 weeks out. Recent echo showed low normal EF with bilateral atria enlargement.  Follow up in the AF clinic 06/28/21. Patient is s/p DCCV on 06/20/21 which was unsuccessful after 3 attempts. Per procedure report, no sinus beats after any of the attempts. She is in rate controlled afib today. Remains asymptomatic.   Follow up in the AF clinic 07/18/21. Patient presents for dofetilide admission. She denies any missed doses of anticoagulation.    Today, she denies  symptoms of palpitations, chest pain, shortness of breath, orthopnea, PND, lower extremity edema, dizziness, presyncope, syncope, or neurologic sequela. The patient is tolerating medications without difficulties and is otherwise without complaint today.   Past Medical History:  Diagnosis Date   Atrial fibrillation ([place])    Breast cancer ([place])    Cancer ([place])    Thyroid disease    Past Surgical History:  Procedure Laterality Date   CARDIOVERSION N/A 06/20/2021   Procedure: CARDIOVERSION;  Surgeon: Angela Rile, MD;  Location: [facility];  Service: Cardiovascular;  Laterality: N/A;   MASTECTOMY Left 1997    Current Facility-Administered Medications  Medication Dose Route Frequency Provider Last Rate Last Admin   0.9 %  sodium chloride infusion  250 mL Intravenous PRN Angela Dean, Angela R, PA       apixaban (ELIQUIS) tablet 5 mg  5 mg Oral BID Angela Dean, Angela R, PA       dofetilide (TIKOSYN) capsule 500 mcg  500 mcg Oral BID Angela Dean, Angela R, PA       [START ON 07/19/2021] levothyroxine (SYNTHROID) tablet 100 mcg  100 mcg Oral Q0600 Angela Dean, Angela R, PA       [START ON 07/19/2021] metoprolol succinate (TOPROL-XL) 24 hr tablet 150 mg  150 mg Oral Daily Angela Dean, Angela R, PA       sodium chloride flush (NS) 0.9 % injection 3 mL  3 mL Intravenous Q12H Angela Dean, Angela R,  PA   3 mL at 07/18/21 1329   sodium chloride flush (NS) 0.9 % injection 3 mL  3 mL Intravenous PRN Angela Dean, Angela R, PA        No Known Allergies  Social History   Socioeconomic History   Marital status: Divorced    Spouse name: Not on file   Number of children: Not on file   Years of education: Not on file   Highest education level: Not on file  Occupational History   Not on file  Tobacco Use   Smoking status: Never   Smokeless tobacco: Never  Substance and Sexual Activity   Alcohol use: No   Drug use: No   Sexual activity: Not on file  Other Topics Concern   Not on file  Social History Narrative   Not on file    Social Determinants of Health   Financial Resource Strain: Not on file  Food Insecurity: Not on file  Transportation Needs: Not on file  Physical Activity: Not on file  Stress: Not on file  Social Connections: Not on file  Intimate Partner Violence: Not on file    Family History  Problem Relation Age of Onset   Heart disease Mother    Hypertension Mother    Cancer Sister    Cancer Sister    Cancer Sister     ROS- All systems are reviewed and negative except as per the HPI above  Physical Exam: Vitals:   07/18/21 1257  BP: 106/80  Pulse: (!) 102  Resp: 19  Temp: 97.9 F (36.6 C)  TempSrc: Oral  SpO2: 97%     Wt Readings from Last 3 Encounters:  07/18/21 70.9 kg  06/28/21 71.8 kg  06/20/21 70.3 kg    Labs: Lab Results  Component Value Date   NA 139 07/18/2021   K 4.8 07/18/2021   CL 104 07/18/2021   CO2 28 07/18/2021   GLUCOSE 110 (H) 07/18/2021   BUN 13 07/18/2021   CREATININE 0.88 07/18/2021   CALCIUM 10.0 07/18/2021   MG 2.0 07/18/2021   No results found for: INR Lab Results  Component Value Date   CHOL 201 (H) 11/29/2020   HDL 76 11/29/2020   LDLCALC 112 (H) 11/29/2020   TRIG 72 11/29/2020    GEN- The patient is a well appearing female, alert and oriented x 3 today.   HEENT-head normocephalic, atraumatic, sclera clear, conjunctiva pink, hearing intact, trachea midline. Lungs- Clear to ausculation bilaterally, normal work of breathing Heart- irregular rate and rhythm, no murmurs, rubs or gallops  GI- soft, NT, ND, + BS Extremities- no clubbing, cyanosis, or edema MS- no significant deformity or atrophy Skin- no rash or lesion Psych- euthymic mood, full affect Neuro- strength and sensation are intact   EKG- afib Vent. rate 115 BPM PR interval * ms QRS duration 78 ms QT/QTcB 336/464 ms  Echo-  05/16/2021 1. Left ventricular ejection fraction, by estimation, is 50% with beat to beat variablity in afib. The left ventricle has low  normal function. The left ventricle has no regional wall motion abnormalities. There is mild left ventricular hypertrophy. Left  ventricular diastolic parameters are indeterminate.   2. Right ventricular systolic function is normal. The right ventricular size is normal. There is normal pulmonary artery systolic pressure. The estimated right ventricular systolic pressure is 32.3 mmHg.   3. Left atrial size was severely dilated.   4. Right atrial size was severely dilated.   5. The mitral valve is grossly  normal. Mild mitral valve regurgitation. No evidence of mitral stenosis.   6. The aortic valve is tricuspid. There is mild thickening of the aortic valve. Aortic valve regurgitation is mild. No aortic stenosis is present.   7. The inferior vena cava is normal in size with greater than 50% respiratory variability, suggesting right atrial pressure of 3 mmHg.   Assessment and Plan:  1. Persistent Afib  S/p DCCV on 06/20/21 which was unsuccessful. Patient presents for dofetilide admission. Patient aware of price of dofetilide. Continue Eliquis 5 mg BID, states no missed doses in the last 3 weeks. No recent benadryl use PharmD has screened medications Labs today show creatinine at 0.88, K+ 4.8 and mag 2.0, CrCl calculated at 70 mL/min Continue metoprolol succinate 150 mg daily  2. CHA2DS2VASc  score of 2 Continue eliquis 5 mg BID    She presents to [facility] for tikosyn admission as above.   Angela Dean [address] [place], PA-C  07/18/2021 1:42 PM   I have seen and examined this patient with Angela Dean.  Agree with above, note added to reflect my findings.  Patient with a history of atrial fibrillation.  Has symptoms of fatigue and shortness of breath.  Presents to the hospital today for dofetilide load.  GEN: Well nourished, well developed, in no acute distress  HEENT: normal  Neck: no JVD, carotid bruits, or masses Cardiac: Irregular; no murmurs, rubs, or gallops,no edema  Respiratory:  clear to  auscultation bilaterally, normal work of breathing GI: soft, nontender, nondistended, + BS MS: no deformity or atrophy  Skin: warm and dry Neuro:  Strength and sensation are intact Psych: euthymic mood, full affect   Persistent atrial fibrillation: Currently on Eliquis.  CHA2DS2-VASc of 2.  Presents to the hospital today for dofetilide load.  QTC is in an acceptable range.  We Angela Dean plan to initiate at 500 mcg twice daily.  Angela Dean M. Benjy Kana MD 07/18/2021 3:05 PM

## 2021-07-18 NOTE — Progress Notes (Signed)
Primary Care Physician: Chevis Pretty, FNP Referring Physician:Dr. Phineas Inches   Angela Dean is a 66 y.o. female with a h/o JHADA RISK is a 66 y.o. female with a hx of atrial fibrillation CHADS2VASC 2 on aspirin, Thyrod nodule s/p thyroidectomy, breast cancer mastectomy on the L side, no radiation, no chemo   She is in the afib clinic for evaluation after being seen by Dr. Harl Bowie 10/31 for afib. She has RVR today at 115 bpm. She is asymptomatic. It appears that she is persistent. Al thought she has a CHA2DS2-VASc Score = 2, I discussed with pt to pursue restoring SR, it will likely mean pursing cardioversion and for this she will need to be on anticoagulation for at least 21 days prior to this. She does not drink alcohol, smoke, or snore.   F/u in afib clinic, 06/02/21. She remains in rate controlled afib. We discussed risk vrs benefit of pursing cardioversion and she would like to pursue. She did have a doppler for decreased blood flow to rt arm and by doppler was found to have an obstruction. She was directed to the ER for CT and the report read  rt subclavian may be severely stenotic or occluded( age indeterminate) but pt states her BP has not been audible in her rt arm for some time. She had her BB increased in the ER for elevated BP and was told to continue her eliquis.   We discussed pursing CV, risk vrs benefit  and she is in agreement. No missed anticoagulation since starting drug and the cardioversion are running almost 3 weeks out. Recent echo showed low normal EF with bilateral atria enlargement.  Follow up in the AF clinic 06/28/21. Patient is s/p DCCV on 06/20/21 which was unsuccessful after 3 attempts. Per procedure report, no sinus beats after any of the attempts. She is in rate controlled afib today. Remains asymptomatic.   Follow up in the AF clinic 07/18/21. Patient presents for dofetilide admission. She denies any missed doses of anticoagulation.    Today, she denies  symptoms of palpitations, chest pain, shortness of breath, orthopnea, PND, lower extremity edema, dizziness, presyncope, syncope, or neurologic sequela. The patient is tolerating medications without difficulties and is otherwise without complaint today.   Past Medical History:  Diagnosis Date   Atrial fibrillation ([place])    Breast cancer ([place])    Cancer ([place])    Thyroid disease    Past Surgical History:  Procedure Laterality Date   CARDIOVERSION N/A 06/20/2021   Procedure: CARDIOVERSION;  Surgeon: Geralynn Rile, MD;  Location: [facility];  Service: Cardiovascular;  Laterality: N/A;   MASTECTOMY Left 1997    Current Outpatient Medications  Medication Sig Dispense Refill   apixaban (ELIQUIS) 5 MG TABS tablet Take 1 tablet (5 mg total) by mouth 2 (two) times daily. 60 tablet 3   levothyroxine (SYNTHROID) 100 MCG tablet Take 1 tablet (100 mcg total) by mouth daily before breakfast. 30 tablet 11   metoprolol succinate (TOPROL XL) 50 MG 24 hr tablet Take 3 tablets (150 mg total) by mouth daily. Take with or immediately following a meal. 90 tablet 3   No current facility-administered medications for this encounter.    No Known Allergies  Social History   Socioeconomic History   Marital status: Divorced    Spouse name: Not on file   Number of children: Not on file   Years of education: Not on file   Highest education level: Not on file  Occupational History   Not on file  Tobacco Use   Smoking status: Never   Smokeless tobacco: Never  Substance and Sexual Activity   Alcohol use: No   Drug use: No   Sexual activity: Not on file  Other Topics Concern   Not on file  Social History Narrative   Not on file   Social Determinants of Health   Financial Resource Strain: Not on file  Food Insecurity: Not on file  Transportation Needs: Not on file  Physical Activity: Not on file  Stress: Not on file  Social Connections: Not on file  Intimate Partner Violence: Not on  file    Family History  Problem Relation Age of Onset   Heart disease Mother    Hypertension Mother    Cancer Sister    Cancer Sister    Cancer Sister     ROS- All systems are reviewed and negative except as per the HPI above  Physical Exam: Vitals:   07/18/21 1132  BP: 138/90  Pulse: (!) 115  Weight: 70.9 kg  Height: 5\' 4"  (1.626 m)     Wt Readings from Last 3 Encounters:  07/18/21 70.9 kg  06/28/21 71.8 kg  06/20/21 70.3 kg    Labs: Lab Results  Component Value Date   NA 140 06/20/2021   K 4.5 06/20/2021   CL 107 06/20/2021   CO2 25 06/20/2021   GLUCOSE 98 06/20/2021   BUN 11 06/20/2021   CREATININE 0.84 06/20/2021   CALCIUM 9.6 06/20/2021   MG 2.3 06/28/2021   No results found for: INR Lab Results  Component Value Date   CHOL 201 (H) 11/29/2020   HDL 76 11/29/2020   LDLCALC 112 (H) 11/29/2020   TRIG 72 11/29/2020    GEN- The patient is a well appearing female, alert and oriented x 3 today.   HEENT-head normocephalic, atraumatic, sclera clear, conjunctiva pink, hearing intact, trachea midline. Lungs- Clear to ausculation bilaterally, normal work of breathing Heart- irregular rate and rhythm, no murmurs, rubs or gallops  GI- soft, NT, ND, + BS Extremities- no clubbing, cyanosis, or edema MS- no significant deformity or atrophy Skin- no rash or lesion Psych- euthymic mood, full affect Neuro- strength and sensation are intact   EKG- afib Vent. rate 115 BPM PR interval * ms QRS duration 78 ms QT/QTcB 336/464 ms  Echo-  05/16/2021 1. Left ventricular ejection fraction, by estimation, is 50% with beat to beat variablity in afib. The left ventricle has low normal function. The left ventricle has no regional wall motion abnormalities. There is mild left ventricular hypertrophy. Left  ventricular diastolic parameters are indeterminate.   2. Right ventricular systolic function is normal. The right ventricular size is normal. There is normal pulmonary  artery systolic pressure. The estimated right ventricular systolic pressure is 00.1 mmHg.   3. Left atrial size was severely dilated.   4. Right atrial size was severely dilated.   5. The mitral valve is grossly normal. Mild mitral valve regurgitation. No evidence of mitral stenosis.   6. The aortic valve is tricuspid. There is mild thickening of the aortic valve. Aortic valve regurgitation is mild. No aortic stenosis is present.   7. The inferior vena cava is normal in size with greater than 50% respiratory variability, suggesting right atrial pressure of 3 mmHg.   Assessment and Plan:  1. Persistent Afib  S/p DCCV on 06/20/21 which was unsuccessful. Patient presents for dofetilide admission. Patient aware of price of dofetilide. Continue  Eliquis 5 mg BID, states no missed doses in the last 3 weeks. No recent benadryl use PharmD has screened medications Labs today show creatinine at 0.88, K+ 4.8 and mag 2.0, CrCl calculated at 70 mL/min Continue metoprolol succinate 150 mg daily  2. CHA2DS2VASc  score of 2 Continue eliquis 5 mg BID    To be admitted later today once a bed becomes available.    [facility] [address] [place], [place] [address] [phone]

## 2021-07-18 NOTE — Care Management (Signed)
1455 07-18-21 Patient presented for Tikosyn Load. Benefits check submitted for Tikosyn. Case Manager will discuss cost and pharmacy of choice prior to transition home.

## 2021-07-18 NOTE — Progress Notes (Signed)
Pt admitted to [facility], VS wnL and as per flow. Afib 100s on portable telemetry. Pt oriented to 6E processes. Pt familiar with the [facility] system. All questions and concerns addressed. Pt ordering dinner with son bedside. Call bell placed within reach, will continue to monitor and maintain safety.

## 2021-07-19 ENCOUNTER — Encounter (HOSPITAL_COMMUNITY): Payer: Self-pay | Admitting: Cardiology

## 2021-07-19 DIAGNOSIS — I4819 Other persistent atrial fibrillation: Secondary | ICD-10-CM | POA: Diagnosis not present

## 2021-07-19 LAB — BASIC METABOLIC PANEL
Anion gap: 5 (ref 5–15)
BUN: 13 mg/dL (ref 8–23)
CO2: 27 mmol/L (ref 22–32)
Calcium: 8.9 mg/dL (ref 8.9–10.3)
Chloride: 103 mmol/L (ref 98–111)
Creatinine, Ser: 0.97 mg/dL (ref 0.44–1.00)
GFR, Estimated: >60 mL/min (ref >60)
Glucose, Bld: 90 mg/dL (ref 70–99)
Potassium: 3.8 mmol/L (ref 3.5–5.1)
Sodium: 135 mmol/L (ref 135–145)

## 2021-07-19 LAB — MAGNESIUM: Magnesium: 2.4 mg/dL (ref 1.7–2.4)

## 2021-07-19 MED ORDER — POTASSIUM CHLORIDE CRYS ER 20 MEQ PO TBCR
40.0000 meq | EXTENDED_RELEASE_TABLET | Freq: Once | ORAL | Status: AC
  Administered 2021-07-19: 09:00:00 40 meq via ORAL
  Filled 2021-07-19: qty 2

## 2021-07-19 MED ORDER — METOPROLOL SUCCINATE ER 50 MG PO TB24
50.0000 mg | ORAL_TABLET | Freq: Three times a day (TID) | ORAL | Status: DC
  Administered 2021-07-19: 10:00:00 150 mg via ORAL

## 2021-07-19 MED ORDER — METOPROLOL SUCCINATE ER 50 MG PO TB24
50.0000 mg | ORAL_TABLET | Freq: Every day | ORAL | Status: DC
  Administered 2021-07-19 – 2021-07-20 (×2): 50 mg via ORAL
  Filled 2021-07-19 (×2): qty 1

## 2021-07-19 MED ORDER — METOPROLOL SUCCINATE ER 50 MG PO TB24
50.0000 mg | ORAL_TABLET | Freq: Once | ORAL | Status: AC
  Administered 2021-07-19: 16:00:00 50 mg via ORAL
  Filled 2021-07-19: qty 1

## 2021-07-19 MED ORDER — METOPROLOL SUCCINATE ER 100 MG PO TB24
100.0000 mg | ORAL_TABLET | Freq: Every day | ORAL | Status: DC
  Administered 2021-07-20: 100 mg via ORAL
  Filled 2021-07-19 (×2): qty 1

## 2021-07-19 NOTE — Progress Notes (Signed)
Pt provided paper copy negative Covid test. Placed copy on [facility] chart

## 2021-07-19 NOTE — Progress Notes (Signed)
Pharmacy: Dofetilide (Tikosyn) - Follow Up Assessment and Electrolyte Replacement  Pharmacy consulted to assist in monitoring and replacing electrolytes in this 66 y.o. female admitted on 07/18/2021 undergoing dofetilide initiation. First dofetilide dose: 07/18/21.  Labs:    Component Value Date/Time   K 3.8 07/19/2021 0334   MG 2.4 07/19/2021 0334     Plan: Potassium: K 3.8-3.9:  Give KCl 40 mEq po x1   Magnesium: Mg > 2: No additional supplementation needed  Will continue to follow for electrolyte replacement recommendations at discharge.   Patient copay $45.71.   Thank you for allowing pharmacy to participate in this patient's care   Erin Hearing PharmD., BCPS Clinical Pharmacist 07/19/2021 7:52 AM

## 2021-07-19 NOTE — H&P (View-Only) (Signed)
Morning EKG reviewed    Shows pt remains in afib in 80-90s bpm with stable QTc at ~480 ms measured manually and averaged for HR irregularity  Continue  Tikosyn 500 mcg BID.   Pt will be NPO after midnight for DCCV if remains in [place], [place]  Pager: [phone]  07/19/2021 11:29 AM

## 2021-07-19 NOTE — Care Management (Signed)
1459 07-19-21 Case Manager spoke with patient regarding Tikosyn cost. Patients insurance quoted her $90.00 for 30 day supply and we received co pay at $45.71. Patient will not be able to afford the $90.00 co pay and we discussed good Rx. App downloaded on her smart phone. Patient would like initial Rx filled via [facility] and Rx refills to be sent to [facility].

## 2021-07-19 NOTE — Progress Notes (Signed)
Morning EKG reviewed    Shows pt remains in afib in 80-90s bpm with stable QTc at ~480 ms measured manually and averaged for HR irregularity  Continue  Tikosyn 500 mcg BID.   Pt will be NPO after midnight for DCCV if remains in [place], [place]  Pager: [phone]  07/19/2021 11:29 AM

## 2021-07-19 NOTE — Plan of Care (Signed)
Problem: Education: °Goal: Knowledge of General Education information will improve °Description: Including pain rating scale, medication(s)/side effects and non-pharmacologic comfort measures °Outcome: Completed/Met °  °Problem: Health Behavior/Discharge Planning: °Goal: Ability to manage health-related needs will improve °Outcome: Completed/Met °  °

## 2021-07-19 NOTE — Progress Notes (Addendum)
Electrophysiology Rounding Note  Patient Name: Angela Dean Date of Encounter: 07/19/2021  Primary Cardiologist: None  Electrophysiologist: None    Subjective   Pt remains in afib on Tikosyn 500 mcg BID   QTc from EKG last pm shows stable QTc   The patient is doing well today.  At this time, the patient denies chest pain, shortness of breath, or any new concerns.  Inpatient Medications    Scheduled Meds:  apixaban  5 mg Oral BID   dofetilide  500 mcg Oral BID   levothyroxine  100 mcg Oral Q0600   metoprolol succinate  150 mg Oral Daily   potassium chloride  40 mEq Oral Once   sodium chloride flush  3 mL Intravenous Q12H   Continuous Infusions:  sodium chloride     PRN Meds: sodium chloride, sodium chloride flush   Vital Signs    Vitals:   07/18/21 1958 07/19/21 0048 07/19/21 0500 07/19/21 0700  BP:   113/83 104/80  Pulse:   80 96  Resp: 18 18 18    Temp: 97.9 F (36.6 C) 97.6 F (36.4 C) 97.7 F (36.5 C)   TempSrc: Oral  Oral   SpO2:   98% 96%   No intake or output data in the 24 hours ending 07/19/21 0824 There were no vitals filed for this visit.  Physical Exam    GEN- The patient is well appearing, alert and oriented x 3 today.   Head- normocephalic, atraumatic Eyes-  Sclera clear, conjunctiva pink Ears- hearing intact Oropharynx- clear Neck- supple Lungs- Clear to ausculation bilaterally, normal work of breathing Heart- Irregularly irregular rate and rhythm, no murmurs, rubs or gallops GI- soft, NT, ND, + BS Extremities- no clubbing, cyanosis, or edema Skin- no rash or lesion Psych- euthymic mood, full affect Neuro- strength and sensation are intact  Labs    CBC No results for input(s): WBC, NEUTROABS, HGB, HCT, MCV, PLT in the last 72 hours. Basic Metabolic Panel Recent Labs    07/18/21 1139 07/19/21 0334  NA 139 135  K 4.8 3.8  CL 104 103  CO2 28 27  GLUCOSE 110* 90  BUN 13 13  CREATININE 0.88 0.97  CALCIUM 10.0 8.9  MG 2.0  2.4    Potassium  Date/Time Value Ref Range Status  07/19/2021 03:34 AM 3.8 3.5 - 5.1 mmol/L Final    Comment:    DELTA CHECK NOTED   Magnesium  Date/Time Value Ref Range Status  07/19/2021 03:34 AM 2.4 1.7 - 2.4 mg/dL Final    Comment:    Performed at [facility], [place] [address] [place], [place] [address]    Telemetry    AF 70-90s (personally reviewed)  Radiology    No results found.   Patient Profile     Angela Dean is a 66 y.o. female with a past medical history significant for persistent atrial fibrillation.  They were admitted for tikosyn load.   Assessment & Plan    Persistent atrial fibrillation Pt remains in afib on Tikosyn 500 mcg BID  Continue Eliquis K 3.8, Mg 2.4. supp K CHA2DS2VASC is at least 2.   If pt does not convert chemically, plan on DCCV tomorrow      For questions or updates, please contact [place] Please consult [email or web address] for contact info under Cardiology/STEMI.  Signed, Shirley Friar, PA-C  07/19/2021, 8:24 AM   I have seen and examined this patient with [name].  Agree with above,  note added to reflect my findings.  Remains in atrial fibrillation.  Currently feeling well without complaint.  GEN: Well nourished, well developed, in no acute distress  HEENT: normal  Neck: no JVD, carotid bruits, or masses Cardiac: Irregular; no murmurs, rubs, or gallops,no edema  Respiratory:  clear to auscultation bilaterally, normal work of breathing GI: soft, nontender, nondistended, + BS MS: no deformity or atrophy  Skin: warm and dry Neuro:  Strength and sensation are intact Psych: euthymic mood, full affect   Persistent atrial fibrillation: QTC is remained stable on her current dose of dofetilide.  We Angela Dean continue with 500 mcg twice daily.  She does not convert to sinus rhythm, Angela Dean plan for cardioversion tomorrow.  Angela Dean M. Chondra Boyde MD 07/19/2021 8:41 AM

## 2021-07-20 ENCOUNTER — Inpatient Hospital Stay (HOSPITAL_COMMUNITY): Payer: BC Managed Care – PPO | Admitting: Anesthesiology

## 2021-07-20 ENCOUNTER — Encounter (HOSPITAL_COMMUNITY)
Admission: AD | Disposition: A | Discharge status: Home / Self Care | Payer: Self-pay | Source: Ambulatory Visit | Attending: Cardiology

## 2021-07-20 ENCOUNTER — Encounter (HOSPITAL_COMMUNITY): Payer: Self-pay | Admitting: Cardiology

## 2021-07-20 DIAGNOSIS — I4819 Other persistent atrial fibrillation: Secondary | ICD-10-CM | POA: Diagnosis not present

## 2021-07-20 HISTORY — PX: CARDIOVERSION: SHX1299

## 2021-07-20 LAB — PROTIME-INR
INR: 1 (ref 0.8–1.2)
Prothrombin Time: 13.6 seconds (ref 11.4–15.2)

## 2021-07-20 LAB — BASIC METABOLIC PANEL
Anion gap: 7 (ref 5–15)
BUN: 19 mg/dL (ref 8–23)
CO2: 26 mmol/L (ref 22–32)
Calcium: 9.2 mg/dL (ref 8.9–10.3)
Chloride: 106 mmol/L (ref 98–111)
Creatinine, Ser: 0.82 mg/dL (ref 0.44–1.00)
GFR, Estimated: >60 mL/min (ref >60)
Glucose, Bld: 96 mg/dL (ref 70–99)
Potassium: 4.3 mmol/L (ref 3.5–5.1)
Sodium: 139 mmol/L (ref 135–145)

## 2021-07-20 LAB — MAGNESIUM: Magnesium: 2 mg/dL (ref 1.7–2.4)

## 2021-07-20 SURGERY — CARDIOVERSION
Anesthesia: General

## 2021-07-20 MED ORDER — SODIUM CHLORIDE 0.9 % IV SOLN: INTRAVENOUS | Status: DC | PRN

## 2021-07-20 MED ORDER — LIDOCAINE 2% (20 MG/ML) 5 ML SYRINGE
INTRAMUSCULAR | Status: DC | PRN
  Administered 2021-07-20: 70 mg via INTRAVENOUS

## 2021-07-20 MED ORDER — PROPOFOL 10 MG/ML IV BOLUS
INTRAVENOUS | Status: DC | PRN
  Administered 2021-07-20: 70 mg via INTRAVENOUS

## 2021-07-20 MED ORDER — MAGNESIUM SULFATE 2 GM/50ML IV SOLN
2.0000 g | Freq: Once | INTRAVENOUS | Status: AC
  Administered 2021-07-20: 2 g via INTRAVENOUS
  Filled 2021-07-20: qty 50

## 2021-07-20 MED ORDER — SODIUM CHLORIDE 0.9 % IV SOLN: INTRAVENOUS | Status: DC

## 2021-07-20 NOTE — Progress Notes (Signed)
Pharmacy: Dofetilide (Tikosyn) - Follow Up Assessment and Electrolyte Replacement  Pharmacy consulted to assist in monitoring and replacing electrolytes in this 66 y.o. female admitted on 07/18/2021 undergoing dofetilide initiation. First dofetilide dose: 07/18/21  Labs:    Component Value Date/Time   K 4.3 07/20/2021 0629   MG 2.0 07/20/2021 3507     Plan: Potassium: K >/= 4: No additional supplementation needed  Magnesium: Mg 1.8-2: Give Mg 2 gm IV x1   Patient copay $45.71  Thank you for allowing pharmacy to participate in this patient's care   Cathrine Muster, PharmD PGY2 Cardiology Pharmacy Resident 07/20/2021  7:50 AM  Please check [email or web address] for unit-specific pharmacy phone numbers.  Eduard Clos Angela Dean 07/20/2021  7:48 AM

## 2021-07-20 NOTE — Transfer of Care (Signed)
Immediate Anesthesia Transfer of Care Note  Patient: Angela Dean  Procedure(s) Performed: CARDIOVERSION  Patient Location: Endoscopy Unit  Anesthesia Type:General  Level of Consciousness: drowsy  Airway & Oxygen Therapy: Patient Spontanous Breathing  Post-op Assessment: Report given to RN and Post -op Vital signs reviewed and stable  Post vital signs: Reviewed and stable  Last Vitals:  Vitals Value Taken Time  BP 99/74 07/20/21 1101  Temp    Pulse 63 07/20/21 1101  Resp 18 07/20/21 1101  SpO2 96 07/20/21 1101    Last Pain:  Vitals:   07/20/21 1040  TempSrc: Temporal  PainSc: 0-No pain      Patients Stated Pain Goal: 0 (54/36/06 7703)  Complications: No notable events documented.

## 2021-07-20 NOTE — Anesthesia Postprocedure Evaluation (Signed)
Anesthesia Post Note  Patient: Angela Dean  Procedure(s) Performed: CARDIOVERSION     Patient location during evaluation: PACU Anesthesia Type: General Level of consciousness: sedated and patient cooperative Pain management: pain level controlled Vital Signs Assessment: post-procedure vital signs reviewed and stable Respiratory status: spontaneous breathing Cardiovascular status: stable Anesthetic complications: no   No notable events documented.  Last Vitals:  Vitals:   07/20/21 1300 07/20/21 1331  BP: (!) 100/53 94/65  Pulse: 74   Resp: 17   Temp: (!) 35.9 C   SpO2: 97%     Last Pain:  Vitals:   07/20/21 1300  TempSrc: Axillary  PainSc:                  [name]

## 2021-07-20 NOTE — Progress Notes (Signed)
Morning EKG reviewed    Shows pt remains in afib with borderline QTc, but looks OK when measured manually.   Post [place] EKG with stable QTc.   Continue  Tikosyn 500 mcg BID.   Plan for home tomorrow if QTc remains stable.   Shirley Friar, [place]  Pager: [phone]  07/20/2021 12:46 PM

## 2021-07-20 NOTE — Progress Notes (Signed)
07/20/21 0910  Clinical Encounter Type  Visited With Patient  Visit Type Initial;Pre-op  Referral From Nurse  Consult/Referral To Chaplain  Spiritual Encounters  Spiritual Needs Emotional  Stress Factors  Patient Stress Factors Exhausted;Health changes  Family Stress Factors Family relationships;Lack of caregivers   Chaplain Melvenia Beam initially met with Ms. Maybe at bedside per request to discuss advance directive, but during consultation learned that patient had additional concerns regarding her health changes, forthcoming procedure, and on-going care for her mother with whom she lives. Chaplain asked open ended questions to facilitate emotional expression and conversation that allowed patient opportunity to to discuss her A-Fib which was diagnosed around [place]. Patient discussed fear of not fully understanding her condition, but was aware of the complications that her A-Fib could cause. Patient expressed anxiety while discussing her condition and became tearful during conversation. Chaplain encouraged patient to openly express her concerns and discuss her anxiety. Patient hopeful for positive outcome from today's procedure.  Patient openly discussed the closeness of her relationship she has with son Cornell Barman, 17) and daughter Lonn [place], 55) who live near her and have have been good support network for her. Patient lives with her 16 y.o. mother and her sister, who together care for their mother who is totally dependant upon the sisters. She mentioned two other sister's who are not involved in their mother's care. In addition to caring for their mother, patient and sister both work full-time jobs outside of the home. Patient acknowledges that both her and sister are mentally and physically exhausted. Sister has had to take vacation time from work to remain home while patient is in hospital.  Patient openly discussed her [place], belief in [place], and previously attending local   [place]. Patient stated that she has not been able to continue her participation because of life's demands, but is hopeful that she is able join a faith community in the future when life's demands get easier.  Chaplain provided instruction for completion of advance directive, explaining procedure for completing part's A (HCPOA); and B (Living Will). Patient stated she plans to name her son and daughter as her representatives. Chaplain provided supportive and reflective prayer in patient's Christian faith tradition at her request.    Charlett Nose is available for any follow up with the patient relating to this matter at [phone].

## 2021-07-20 NOTE — Procedures (Signed)
Electrical Cardioversion Procedure Note Angela Dean [id number] 12-Apr-1956  Procedure: Electrical Cardioversion Indications:  Atrial Fibrillation  Procedure Details Consent: Risks of procedure as well as the alternatives and risks of each were explained to the (patient/caregiver).  Consent for procedure obtained. Time Out: Verified patient identification, verified procedure, site/side was marked, verified correct patient position, special equipment/implants available, medications/allergies/relevent history reviewed, required imaging and test results available.  Performed  Patient placed on cardiac monitor, pulse oximetry, supplemental oxygen as necessary.  Sedation given:  Pt sedated by anesthesia with lidocaine 70 mg and diprovan 70 mg IV. Pacer pads placed anterior and posterior chest.  Cardioverted 1 time(s).  Cardioverted at [place].  Evaluation Findings: Post procedure EKG shows: NSR Complications: None Patient did tolerate procedure well.   Kirk Ruths 07/20/2021, 10:39 AM

## 2021-07-20 NOTE — Anesthesia Preprocedure Evaluation (Addendum)
Anesthesia Evaluation  Patient identified by MRN, date of birth, ID band Patient awake    Reviewed: Allergy & Precautions, NPO status , Patient's Chart, lab work & pertinent test results, reviewed documented beta blocker date and time   History of Anesthesia Complications Negative for: history of anesthetic complications  Airway Mallampati: II  TM Distance: >3 FB Neck ROM: Full    Dental  (+) Dental Advisory Given, Teeth Intact   Pulmonary neg pulmonary ROS,    Pulmonary exam normal breath sounds clear to auscultation       Cardiovascular Pt. on home beta blockers Normal cardiovascular exam+ dysrhythmias Atrial Fibrillation + Valvular Problems/Murmurs AI  Rhythm:Regular Rate:Normal  Echo 04/2021 1. Left ventricular ejection fraction, by estimation, is 50% with beat to beat variablity in afib. The left ventricle has low normal function. The left ventricle has no regional wall motion abnormalities. There is mild left ventricular hypertrophy. Left ventricular diastolic parameters are indeterminate.   2. Right ventricular systolic function is normal. The right ventricular size is normal. There is normal pulmonary artery systolic pressure. The estimated right ventricular systolic pressure is 91.7 mmHg.  3. Left atrial size was severely dilated.  4. Right atrial size was severely dilated.  5. The mitral valve is grossly normal. Mild mitral valve regurgitation. No evidence of mitral stenosis.  6. The aortic valve is tricuspid. There is mild thickening of the aortic valve. Aortic valve regurgitation is mild. No aortic stenosis is present.  7. The inferior vena cava is normal in size with greater than 50% respiratory variability, suggesting right atrial pressure of 3 mmHg.    Neuro/Psych negative neurological ROS     GI/Hepatic negative GI ROS, Neg liver ROS,   Endo/Other  Hypothyroidism   Renal/GU negative Renal ROS      Musculoskeletal negative musculoskeletal ROS (+)   Abdominal   Peds  Hematology negative hematology ROS (+)   Anesthesia Other Findings   Reproductive/Obstetrics                           Anesthesia Physical  Anesthesia Plan  ASA: 3  Anesthesia Plan: General   Post-op Pain Management: Minimal or no pain anticipated   Induction: Intravenous  PONV Risk Score and Plan: 3 and Treatment may vary due to age or medical condition, Propofol infusion and TIVA  Airway Management Planned: Mask  Additional Equipment: None  Intra-op Plan:   Post-operative Plan:   Informed Consent: I have reviewed the patients History and Physical, chart, labs and discussed the procedure including the risks, benefits and alternatives for the proposed anesthesia with the patient or authorized representative who has indicated his/her understanding and acceptance.     Dental advisory given  Plan Discussed with: CRNA  Anesthesia Plan Comments:        Anesthesia Quick Evaluation

## 2021-07-20 NOTE — Progress Notes (Addendum)
Electrophysiology Rounding Note  Patient Name: Angela Dean Date of Encounter: 07/20/2021  Primary Cardiologist: None  Electrophysiologist: None    Subjective   Pt remains in afib on Tikosyn 500 mcg BID   QTc from EKG last pm shows stable QTc at ~480  The patient is doing well today.  At this time, the patient denies chest pain, shortness of breath, or any new concerns.  Inpatient Medications    Scheduled Meds:  apixaban  5 mg Oral BID   dofetilide  500 mcg Oral BID   levothyroxine  100 mcg Oral Q0600   metoprolol succinate  100 mg Oral Daily   metoprolol succinate  50 mg Oral QHS   sodium chloride flush  3 mL Intravenous Q12H   Continuous Infusions:  sodium chloride     sodium chloride     PRN Meds: sodium chloride, sodium chloride flush   Vital Signs    Vitals:   07/19/21 2002 07/19/21 2004 07/19/21 2030 07/20/21 0528  BP: 107/73 107/73  103/76  Pulse: 80 86  90  Resp: 18 18  19   Temp:  98 F (36.7 C)  97.7 F (36.5 C)  TempSrc: Oral Oral  Oral  SpO2: 94% 96%  96%  Weight:   70.8 kg   Height:   5\' 4"  (1.626 m)     Intake/Output Summary (Last 24 hours) at 07/20/2021 0659 Last data filed at 07/19/2021 2030 Gross per 24 hour  Intake 240 ml  Output --  Net 240 ml   Filed Weights   07/19/21 2030  Weight: 70.8 kg    Physical Exam    GEN- The patient is well appearing, alert and oriented x 3 today.   Head- normocephalic, atraumatic Eyes-  Sclera clear, conjunctiva pink Ears- hearing intact Oropharynx- clear Neck- supple Lungs- Clear to ausculation bilaterally, normal work of breathing Heart- Irregularly irregular rate and rhythm, no murmurs, rubs or gallops GI- soft, NT, ND, + BS Extremities- no clubbing, cyanosis, or edema Skin- no rash or lesion Psych- euthymic mood, full affect Neuro- strength and sensation are intact  Labs    CBC No results for input(s): WBC, NEUTROABS, HGB, HCT, MCV, PLT in the last 72 hours. Basic Metabolic  Panel Recent Labs    07/18/21 1139 07/19/21 0334  NA 139 135  K 4.8 3.8  CL 104 103  CO2 28 27  GLUCOSE 110* 90  BUN 13 13  CREATININE 0.88 0.97  CALCIUM 10.0 8.9  MG 2.0 2.4    Potassium  Date/Time Value Ref Range Status  07/19/2021 03:34 AM 3.8 3.5 - 5.1 mmol/L Final    Comment:    DELTA CHECK NOTED   Magnesium  Date/Time Value Ref Range Status  07/19/2021 03:34 AM 2.4 1.7 - 2.4 mg/dL Final    Comment:    Performed at [facility], [place] [address], [place], [place] [address]    Telemetry    AF 80-110s (personally reviewed)  Radiology    No results found.   Patient Profile     Angela Dean is a 66 y.o. female with a past medical history significant for persistent atrial fibrillation.  They were admitted for tikosyn load.   Assessment & Plan    Persistent atrial fibrillation Pt remains in afib on Tikosyn 500 mcg BID  Continue Eliquis K 4.3 Mg 2.0 CHA2DS2VASC is at least 3.  Pt NPO for [facility] this afternoon.    For questions or updates, please contact [facility] Please consult [email or web address] for contact info under Cardiology/STEMI.  Signed, Angela Friar, PA-C  07/20/2021, 6:59 AM   I have seen and examined this patient with Angela Dean.  Agree with above, note added to reflect my findings.  Remains in atrial fibrillation.  Ready for cardioversion later today.  GEN: Well nourished, well developed, in no acute distress  HEENT: normal  Neck: no JVD, carotid bruits, or masses Cardiac: Irregular; no murmurs, rubs, or gallops,no edema  Respiratory:  clear to auscultation bilaterally, normal work of breathing GI: soft, nontender, nondistended, + BS MS: no deformity or atrophy  Skin: warm and dry Neuro:  Strength and sensation are intact Psych: euthymic mood, full affect   Persistent atrial fibrillation: QTC is remained stable.  Angela Dean continue with current dose of dofetilide.  Angela Dean plan for cardioversion today.  If she remains in  sinus rhythm, Angela Dean plan for discharge tomorrow.  Angela Dean M. Angela Lomeli MD 07/20/2021 4:04 PM

## 2021-07-20 NOTE — Progress Notes (Signed)
Mobility Specialist Progress Note    07/20/21 1424  Mobility  Activity Ambulated independently in hallway  Level of Assistance Independent  Assistive Device None  Distance Ambulated (ft) 1920 ft  Activity Response Tolerated well  $Mobility charge 1 Mobility   Pre-Mobility: 61 HR During Mobility: 83 HR Post-Mobility: 76 HR  Pt received in bed and agreeable. No complaints. Returned to bed with nursing student present.   [facility] Mobility Specialist  M.S. 2C and 6E: [phone] M.S. 4E: [phone]

## 2021-07-20 NOTE — Interval H&P Note (Signed)
History and Physical Interval Note:  07/20/2021 10:38 AM  Angela Dean  has presented today for surgery, with the diagnosis of afib.  The various methods of treatment have been discussed with the patient and family. After consideration of risks, benefits and other options for treatment, the patient has consented to  Procedure(s): CARDIOVERSION (N/A) as a surgical intervention.  The patient's history has been reviewed, patient examined, no change in status, stable for surgery.  I have reviewed the patient's chart and labs.  Questions were answered to the patient's satisfaction.     Kirk Ruths

## 2021-07-21 ENCOUNTER — Other Ambulatory Visit (HOSPITAL_COMMUNITY): Payer: Self-pay

## 2021-07-21 ENCOUNTER — Encounter (HOSPITAL_COMMUNITY): Payer: Self-pay | Admitting: Cardiology

## 2021-07-21 DIAGNOSIS — I4819 Other persistent atrial fibrillation: Secondary | ICD-10-CM | POA: Diagnosis not present

## 2021-07-21 LAB — BASIC METABOLIC PANEL
Anion gap: 7 (ref 5–15)
BUN: 27 mg/dL — ABNORMAL HIGH (ref 8–23)
CO2: 25 mmol/L (ref 22–32)
Calcium: 9 mg/dL (ref 8.9–10.3)
Chloride: 105 mmol/L (ref 98–111)
Creatinine, Ser: 0.96 mg/dL (ref 0.44–1.00)
GFR, Estimated: >60 mL/min (ref >60)
Glucose, Bld: 96 mg/dL (ref 70–99)
Potassium: 4.2 mmol/L (ref 3.5–5.1)
Sodium: 137 mmol/L (ref 135–145)

## 2021-07-21 LAB — MAGNESIUM: Magnesium: 2.2 mg/dL (ref 1.7–2.4)

## 2021-07-21 MED ORDER — DOFETILIDE 250 MCG PO CAPS
250.0000 ug | ORAL_CAPSULE | Freq: Two times a day (BID) | ORAL | 6 refills | Status: DC
  Filled 2021-07-21: qty 60, 30d supply, fill #0

## 2021-07-21 MED ORDER — METOPROLOL SUCCINATE ER 50 MG PO TB24: 50.0000 mg | ORAL_TABLET | Freq: Two times a day (BID) | ORAL | Status: DC

## 2021-07-21 MED ORDER — METOPROLOL SUCCINATE ER 50 MG PO TB24
50.0000 mg | ORAL_TABLET | Freq: Two times a day (BID) | ORAL | 6 refills | Status: DC
  Filled 2021-07-21: qty 60, 30d supply, fill #0

## 2021-07-21 MED ORDER — DOFETILIDE 250 MCG PO CAPS
250.0000 ug | ORAL_CAPSULE | Freq: Two times a day (BID) | ORAL | Status: DC
  Administered 2021-07-21: 250 ug via ORAL
  Filled 2021-07-21: qty 1

## 2021-07-21 NOTE — Discharge Summary (Addendum)
ELECTROPHYSIOLOGY PROCEDURE DISCHARGE SUMMARY    Patient ID: Angela Dean,  MRN: [id number], DOB/AGE: 07-03-1955 66 y.o.  Admit date: 07/18/2021 Discharge date: 07/21/2021  Primary Care Physician: Chevis Pretty, [place]  Primary Cardiologist: None  Electrophysiologist: None   Primary Discharge Diagnosis:  1.  Persistent atrial fibrillation status post Tikosyn loading this admission  No Known Allergies   Procedures This Admission:  1.  Tikosyn loading 2.  Direct current cardioversion on Thursday July 20, 2021  by Dr Stanford Breed which successfully restored SR.  There were no early apparent complications.   Brief HPI: Angela Dean is a 66 y.o. female with a past medical history as noted above.  They were referred to EP in the outpatient setting for treatment options of atrial fibrillation.  Risks, benefits, and alternatives to Tikosyn were reviewed with the patient who wished to proceed.    Hospital Course:  The patient was admitted and Tikosyn was initiated.  Renal function and electrolytes were followed during the hospitalization.  Their QTc remained stable.  On 07/20/2021 they underwent direct current cardioversion which restored sinus rhythm.  They were monitored until discharge on telemetry which demonstrated Sinus brady/NSR.  On the day of discharge, they were examined by Dr. Curt Bears  who considered them stable for discharge to home.  Follow-up has been arranged with the Atrial Fibrillation clinic in approximately 1 week and with  EP APP   in 4 weeks.   Physical Exam: Vitals:   07/20/21 1331 07/20/21 1343 07/20/21 1424 07/20/21 2018  BP: 94/65 101/73  127/63  Pulse:      Resp:    18  Temp:   97.8 F (36.6 C) 97.8 F (36.6 C)  TempSrc:   Oral Oral  SpO2:    98%  Weight:      Height:        GEN- The patient is well appearing, alert and oriented x 3 today.   HEENT: normocephalic, atraumatic; sclera clear, conjunctiva pink; hearing intact; oropharynx clear; neck  supple, no JVP Lymph- no cervical lymphadenopathy Lungs- Clear to ausculation bilaterally, normal work of breathing.  No wheezes, rales, rhonchi Heart- Regular rate and rhythm, no murmurs, rubs or gallops, PMI not laterally displaced GI- soft, non-tender, non-distended, bowel sounds present, no hepatosplenomegaly Extremities- no clubbing, cyanosis, or edema; DP/PT/radial pulses 2+ bilaterally MS- no significant deformity or atrophy Skin- warm and dry, no rash or lesion Psych- euthymic mood, full affect Neuro- strength and sensation are intact   Labs:   Lab Results  Component Value Date   WBC 6.1 06/20/2021   HGB 14.3 06/20/2021   HCT 44.0 06/20/2021   MCV 90.3 06/20/2021   PLT 211 06/20/2021    Recent Labs  Lab 07/21/21 0152  NA 137  K 4.2  CL 105  CO2 25  BUN 27*  CREATININE 0.96  CALCIUM 9.0  GLUCOSE 96     Discharge Medications:  Allergies as of 07/21/2021   No Known Allergies      Medication List     TAKE these medications    apixaban 5 MG Tabs tablet Commonly known as: Eliquis Take 1 tablet (5 mg total) by mouth 2 (two) times daily.   dofetilide 250 MCG capsule Commonly known as: TIKOSYN Take 1 capsule (250 mcg total) by mouth 2 (two) times daily.   levothyroxine 100 MCG tablet Commonly known as: SYNTHROID Take 1 tablet (100 mcg total) by mouth daily before breakfast.   metoprolol succinate 50 MG 24  hr tablet Commonly known as: TOPROL-XL Take 1 tablet (50 mg total) by mouth 2 (two) times daily. Take with or immediately following a meal. What changed:  how much to take when to take this        Disposition:    Follow-up Information     [facility] up.   Specialty: Cardiology Why: on 2/2 at 1030 for post hospital tikosyn. Contact information: [address] [id number] [place] [address] [phone]                Duration of Discharge Encounter: Greater than 30 minutes  including physician time.  Signed, Shirley Friar, PA-C  07/21/2021 11:22 AM    I have seen and examined this patient with [name].  Agree with above, note added to reflect my findings.  Patient admitted to the hospital for dofetilide load.  Required cardioversion.  QTC at length and after cardioversion and dofetilide dose was reduced.    GEN: Well nourished, well developed, in no acute distress  HEENT: normal  Neck: no JVD, carotid bruits, or masses Cardiac: RRR; no murmurs, rubs, or gallops,no edema  Respiratory:  clear to auscultation bilaterally, normal work of breathing GI: soft, nontender, nondistended, + BS MS: no deformity or atrophy  Skin: warm and dry Neuro:  Strength and sensation are intact Psych: euthymic mood, full affect   Persistent atrial fibrillation: Status post dofetilide load.  Dose reduced due to prolongation of QTC.  Plan for discharge today with follow-up in clinic.  Machel Violante M. Carlen Fils MD 07/21/2021 12:07 PM

## 2021-07-21 NOTE — Progress Notes (Signed)
°   07/21/21 1135  Clinical Encounter Type  Visited With Patient  Visit Type Follow-up  Referral From Nurse  Consult/Referral To Chaplain   Chaplain Jorene Guest responded to the consult request for an Advance Directive. The patient said she has the document but waiting for her family to visit to complete. Advised the patient to inform the nurse when she is ready for the notary. This note was prepared by Jeanine Luz, M.Div..  For questions please contact by phone [phone].

## 2021-07-21 NOTE — Progress Notes (Addendum)
EKG from yesterday evening 07/20/2021 reviewed    Shows remains in NSR at 60 bpm with borderline prolonged QTc at right around 500.   Decrease tikosyn to 250 mcg BID.   K 4.2, MG 2.2  Plan for home today if QTc improves on lower dose.    Shirley Friar, PA-C  Pager: [phone]  07/21/2021 6:59 AM

## 2021-07-21 NOTE — Progress Notes (Signed)
Patients heart rate in the 40s, post cardioversion. I spoke with Jonni Sanger and he stated to hold this am dose. Patient informed

## 2021-07-21 NOTE — Progress Notes (Signed)
Pharmacy: Dofetilide (Tikosyn) - Follow Up Assessment and Electrolyte Replacement  Pharmacy consulted to assist in monitoring and replacing electrolytes in this 66 y.o. female admitted on 07/18/2021 undergoing dofetilide initiation. First dofetilide dose: 07/18/21.   Labs:    Component Value Date/Time   K 4.2 07/21/2021 0152   MG 2.2 07/21/2021 0152     Plan: Potassium: K >/= 4: No additional supplementation needed  Magnesium: Mg > 2: No additional supplementation needed  Patient copay $45.71, discussed outpatient pharmacy options with patient and drug interactions.   Dose reduced to 25mcg bid with prolonged qtc this am.   Thank you for allowing pharmacy to participate in this patient's care   Erin Hearing PharmD., BCPS Clinical Pharmacist 07/21/2021 8:31 AM

## 2021-07-27 ENCOUNTER — Other Ambulatory Visit: Payer: Self-pay

## 2021-07-27 ENCOUNTER — Ambulatory Visit (HOSPITAL_COMMUNITY)
Admission: RE | Admit: 2021-07-27 | Discharge: 2021-07-27 | Disposition: A | Discharge status: Home / Self Care | Payer: BC Managed Care – PPO | Source: Ambulatory Visit | Attending: Nurse Practitioner | Admitting: Nurse Practitioner

## 2021-07-27 VITALS — BP 164/84 | HR 51 | Ht 64.0 in | Wt 156.4 lb

## 2021-07-27 DIAGNOSIS — D6869 Other thrombophilia: Secondary | ICD-10-CM

## 2021-07-27 DIAGNOSIS — Z853 Personal history of malignant neoplasm of breast: Secondary | ICD-10-CM | POA: Diagnosis not present

## 2021-07-27 DIAGNOSIS — Z79899 Other long term (current) drug therapy: Secondary | ICD-10-CM | POA: Diagnosis not present

## 2021-07-27 DIAGNOSIS — I4819 Other persistent atrial fibrillation: Secondary | ICD-10-CM | POA: Insufficient documentation

## 2021-07-27 DIAGNOSIS — Z7901 Long term (current) use of anticoagulants: Secondary | ICD-10-CM | POA: Insufficient documentation

## 2021-07-27 LAB — MAGNESIUM: Magnesium: 2.1 mg/dL (ref 1.7–2.4)

## 2021-07-27 LAB — BASIC METABOLIC PANEL
Anion gap: 8 (ref 5–15)
BUN: 14 mg/dL (ref 8–23)
CO2: 28 mmol/L (ref 22–32)
Calcium: 9.7 mg/dL (ref 8.9–10.3)
Chloride: 105 mmol/L (ref 98–111)
Creatinine, Ser: 0.81 mg/dL (ref 0.44–1.00)
GFR, Estimated: >60 mL/min (ref >60)
Glucose, Bld: 92 mg/dL (ref 70–99)
Potassium: 4.7 mmol/L (ref 3.5–5.1)
Sodium: 141 mmol/L (ref 135–145)

## 2021-07-27 MED ORDER — DOFETILIDE 250 MCG PO CAPS: 250.0000 ug | ORAL_CAPSULE | Freq: Two times a day (BID) | ORAL | 6 refills | Status: DC

## 2021-07-27 NOTE — Progress Notes (Signed)
Primary Care Physician: Chevis Pretty, FNP Referring Physician:Dr. Phineas Inches   Angela Dean is a 66 y.o. female  with a hx of atrial fibrillation CHADS2VASC 2 on aspirin, Thyrod nodule s/p thyroidectomy, breast cancer mastectomy on the L side, no radiation, no chemo   She is in the afib clinic for evaluation after being seen by Dr. Harl Bowie 10/31 for afib. She has RVR today at 115 bpm. She is asymptomatic. It appears that she is persistent. Al thought she has a CHA2DS2-VASc Score = 2, I discussed with pt to pursue restoring SR, it will likely mean pursing cardioversion and for this she will need to be on anticoagulation for at least 21 days prior to this. She does not drink alcohol, smoke, or snore.   F/u in afib clinic, 06/02/21. She remains in rate controlled afib. We discussed risk vrs benefit of pursing cardioversion and she would like to pursue. She did have a doppler for decreased blood flow to rt arm and by doppler was found to have an obstruction. She was directed to the ER for CT and the report read  rt subclavian may be severely stenotic or occluded( age indeterminate) but pt states her BP has not been audible in her rt arm for some time. She had her BB increased in the ER for elevated BP and was told to continue her eliquis.   We discussed pursing CV, risk vrs benefit  and she is in agreement. No missed anticoagulation since starting drug and the cardioversion are running almost 3 weeks out. Recent echo showed low normal EF with bilateral atria enlargement.  Follow up in the AF clinic 06/28/21. Patient is s/p DCCV on 06/20/21 which was unsuccessful after 3 attempts. Per procedure report, no sinus beats after any of the attempts. She is in rate controlled afib today. Remains asymptomatic.   Follow up in the AF clinic 07/18/21. Patient presents for dofetilide admission. She denies any missed doses of anticoagulation.   F/u 2/223. She is now one week out from Tikosyn loading and is in  SR. She has had some back pain since hospital stay, she feels from sitting and lying in bed so much. She is taking Tylenol for it and it is more positional in nature.    Today, she denies symptoms of palpitations, chest pain, shortness of breath, orthopnea, PND, lower extremity edema, dizziness, presyncope, syncope, or neurologic sequela. The patient is tolerating medications without difficulties and is otherwise without complaint today.   Past Medical History:  Diagnosis Date   Atrial fibrillation ([place])    Breast cancer ([place])    Cancer ([place])    Thyroid disease    Past Surgical History:  Procedure Laterality Date   CARDIOVERSION N/A 06/20/2021   Procedure: CARDIOVERSION;  Surgeon: Geralynn Rile, MD;  Location: [facility];  Service: Cardiovascular;  Laterality: N/A;   CARDIOVERSION N/A 07/20/2021   Procedure: CARDIOVERSION;  Surgeon: Lelon Perla, MD;  Location: [facility] ENDOSCOPY;  Service: Cardiovascular;  Laterality: N/A;   MASTECTOMY Left 1997    Current Outpatient Medications  Medication Sig Dispense Refill   apixaban (ELIQUIS) 5 MG TABS tablet Take 1 tablet (5 mg total) by mouth 2 (two) times daily. 60 tablet 3   dofetilide (TIKOSYN) 250 MCG capsule Take 1 capsule (250 mcg total) by mouth 2 (two) times daily. 60 capsule 6   levothyroxine (SYNTHROID) 100 MCG tablet Take 1 tablet (100 mcg total) by mouth daily before breakfast. 30 tablet 11   metoprolol succinate (  TOPROL-XL) 50 MG 24 hr tablet Take 1 tablet (50 mg total) by mouth 2 (two) times daily. Take with or immediately following a meal. 60 tablet 6   No current facility-administered medications for this encounter.    No Known Allergies  Social History   Socioeconomic History   Marital status: Divorced    Spouse name: Not on file   Number of children: Not on file   Years of education: Not on file   Highest education level: Not on file  Occupational History   Not on file  Tobacco Use   Smoking status: Never    Smokeless tobacco: Never  Substance and Sexual Activity   Alcohol use: No   Drug use: No   Sexual activity: Not on file  Other Topics Concern   Not on file  Social History Narrative   Not on file   Social Determinants of Health   Financial Resource Strain: Not on file  Food Insecurity: Not on file  Transportation Needs: Not on file  Physical Activity: Not on file  Stress: Not on file  Social Connections: Not on file  Intimate Partner Violence: Not on file    Family History  Problem Relation Age of Onset   Heart disease Mother    Hypertension Mother    Cancer Sister    Cancer Sister    Cancer Sister     ROS- All systems are reviewed and negative except as per the HPI above  Physical Exam: Vitals:   07/27/21 1031  Weight: 70.9 kg  Height: 5\' 4"  (1.626 m)     Wt Readings from Last 3 Encounters:  07/27/21 70.9 kg  07/20/21 70.8 kg  07/18/21 70.9 kg    Labs: Lab Results  Component Value Date   NA 137 07/21/2021   K 4.2 07/21/2021   CL 105 07/21/2021   CO2 25 07/21/2021   GLUCOSE 96 07/21/2021   BUN 27 (H) 07/21/2021   CREATININE 0.96 07/21/2021   CALCIUM 9.0 07/21/2021   MG 2.2 07/21/2021   Lab Results  Component Value Date   INR 1.0 07/20/2021   Lab Results  Component Value Date   CHOL 201 (H) 11/29/2020   HDL 76 11/29/2020   LDLCALC 112 (H) 11/29/2020   TRIG 72 11/29/2020    GEN- The patient is a well appearing female, alert and oriented x 3 today.   HEENT-head normocephalic, atraumatic, sclera clear, conjunctiva pink, hearing intact, trachea midline. Lungs- Clear to ausculation bilaterally, normal work of breathing Heart- regular rate and rhythm, no murmurs, rubs or gallops  GI- soft, NT, ND, + BS Extremities- no clubbing, cyanosis, or edema MS- no significant deformity or atrophy Skin- no rash or lesion Psych- euthymic mood, full affect Neuro- strength and sensation are intact   Ekg-Vent. rate 51 BPM PR interval 184 ms QRS  duration 82 ms QT/QTcB 490/451 ms P-R-T axes 67 43 56 Sinus bradycardia Nonspecific T wave abnormality Abnormal ECG When compared with ECG of 21-Jul-2021 10:59, No significant change since last tracing Confirmed by Candee Furbish [phone]) on 07/27/2021 12:43:51 PM   Echo-  05/16/2021 1. Left ventricular ejection fraction, by estimation, is 50% with beat to beat variablity in afib. The left ventricle has low normal function. The left ventricle has no regional wall motion abnormalities. There is mild left ventricular hypertrophy. Left  ventricular diastolic parameters are indeterminate.   2. Right ventricular systolic function is normal. The right ventricular size is normal. There is normal pulmonary artery systolic pressure.  The estimated right ventricular systolic pressure is 27.0 mmHg.   3. Left atrial size was severely dilated.   4. Right atrial size was severely dilated.   5. The mitral valve is grossly normal. Mild mitral valve regurgitation. No evidence of mitral stenosis.   6. The aortic valve is tricuspid. There is mild thickening of the aortic valve. Aortic valve regurgitation is mild. No aortic stenosis is present.   7. The inferior vena cava is normal in size with greater than 50% respiratory variability, suggesting right atrial pressure of 3 mmHg.   Assessment and Plan:  1. Persistent Afib  S/p DCCV on 06/20/21 which was unsuccessful. Patient is now one week s/p dofetilide admission, maintaining  SR  Continue 250 mcg bid, RX sent to [facility] with Good RX program Continue Eliquis 5 mg BID  No recent benadryl use Continue metoprolol succinate 150 mg bid   2. CHA2DS2VASc  score of 2 Continue eliquis 5 mg BID    F/u with [name], PA,  in one month, Dr. Phineas Inches 08/01/21   Geroge Baseman. Laurence Folz, [facility] [address] [place], [place] [address] [phone]

## 2021-08-01 ENCOUNTER — Ambulatory Visit: Payer: BC Managed Care – PPO | Admitting: Internal Medicine

## 2021-08-10 ENCOUNTER — Other Ambulatory Visit: Payer: Self-pay

## 2021-08-10 ENCOUNTER — Encounter: Payer: Self-pay | Admitting: Internal Medicine

## 2021-08-10 ENCOUNTER — Ambulatory Visit (INDEPENDENT_AMBULATORY_CARE_PROVIDER_SITE_OTHER): Payer: BC Managed Care – PPO | Admitting: Internal Medicine

## 2021-08-10 VITALS — BP 126/84 | HR 64 | Ht 64.5 in | Wt 154.6 lb

## 2021-08-10 DIAGNOSIS — I773 Arterial fibromuscular dysplasia: Secondary | ICD-10-CM | POA: Diagnosis not present

## 2021-08-10 NOTE — Patient Instructions (Addendum)
Medication Instructions:  No Changes In Medications at this time.  *If you need a refill on your cardiac medications before your next appointment, please call your pharmacy*  Follow-Up: At [facility], you and your health needs are our priority.  As part of our continuing mission to provide you with exceptional heart care, we have created designated Provider Care Teams.  These Care Teams include your primary Cardiologist (physician) and Advanced Practice Providers (APPs -  Physician Assistants and Nurse Practitioners) who all work together to provide you with the care you need, when you need it.  Your next appointment:   6 month(s)  The format for your next appointment:   In Person  Provider:   Janina Mayo, MD    Other Instructions REFERRAL TO [place]

## 2021-08-10 NOTE — Progress Notes (Signed)
Cardiology Office Note:    Date:  08/10/2021   ID:  DAVE MANNES, DOB 09/07/1955, MRN [id number]  PCP:  Chevis Pretty, FNP   [facility] Providers Cardiologist:  None     Referring MD: Hassell Done, Tatia Petrucci-Margaret, *   No chief complaint on file.  New diagnosis atrial fibrillation  History of Present Illness:    Angela Dean is a 66 y.o. female with a hx of atrial fibrillation CHADS2VASC 2 on aspirin, Thyrod nodule s/p thyroidectomy, breast cancer mastectomy on the L side, no radiation, no chemo  She notes diagnosis of atrial fibrillation. When resting after taking care of her mother , she feels palpitations. With activity she has no symptoms. She denies shortness of breath. She denies fatigue but was more fatigued  in June. She stopped taking aspirin thinking it was affecting. No bleeding. No syncope. She denies angina, dyspnea on exertion, lower extremity edema, PND or orthopnea. No smoking. Mother has atrial fibrillation. Father deceased in 25-Sep-2010, no heart disease.No siblings with heart disease. Children no heart disease. She was diagnosed with thyroid dx 30 years ago with nodules. S/p removal. She is on synthroid.    11/29/2020 TSH 0.5 Crt 0.9 LDL 112, HDL 76, TC 201  Normal LFTs  ECG 08/23/2020- atrial fibrillation   Interim Hx: Angela Dean was seen in afib clinic 05/26/2021 and she was noted to have diminished radial pulses. She stated this was long standing. No evidence of limb ischemia  She had BL arterial duplex of the upper extremities. She had > 50% stenosis in the RUE with subclavian artery obstruction, c/f possible thrombus. Flow was noted to be monophasic. Left brachial artery with noted obstruction  Plan was for CTA chest but underwent CT PE study at the ED in [place]. She was noted to have ascending aoritc aneurysm 4.0 cm. ).Minimal aortic arch calcifications. Multiple collateral vessels along the right upper chest.  CT neck showed :  Few areas of right greater  than left subclavian artery narrowing measuring up to 60%. Anomalous ligamentous bands are possible, but there is also a mildly beaded appearance of the cervical internal carotids suggesting an underlying vasculopathy (for example, fibromuscular dysplasia). Additionally, linear filling defect in the distal cervical ICA could reflect a superimposed dissection.  Called radiology, felt if dissection it was stable.   Echo notable for normal LV function. Mild LVH. Mild AI and MR. Noted to have bi-atrail enlargement. E/e' < 14. Normal RA pressure. No significant pulmonary htn  She continues to follow with afib clinic and was admitted for a tikosyn load. She had DCCV 07/20/2021. She converted to sinus bradycardia. Her Qtc was stable. She is maintained on Tikosyn  She comes in today for follow up. She denies palpitations. No LH, dizziness or fainting. She is asymptomatic.    Past Medical History:  Diagnosis Date   Atrial fibrillation ([place])    Breast cancer ([place])    Cancer ([place])    Thyroid disease     Past Surgical History:  Procedure Laterality Date   CARDIOVERSION N/A 06/20/2021   Procedure: CARDIOVERSION;  Surgeon: Geralynn Rile, MD;  Location: [place] [place];  Service: Cardiovascular;  Laterality: N/A;   CARDIOVERSION N/A 07/20/2021   Procedure: CARDIOVERSION;  Surgeon: Lelon Perla, MD;  Location: [facility] ENDOSCOPY;  Service: Cardiovascular;  Laterality: N/A;   MASTECTOMY Left 1997    Current Medications: Current Meds  Medication Sig   Acetaminophen (TYLENOL PO) Take 500 mg by mouth as needed.   apixaban (ELIQUIS)  5 MG TABS tablet Take 1 tablet (5 mg total) by mouth 2 (two) times daily.   dofetilide (TIKOSYN) 250 MCG capsule Take 1 capsule (250 mcg total) by mouth 2 (two) times daily.   levothyroxine (SYNTHROID) 100 MCG tablet Take 1 tablet (100 mcg total) by mouth daily before breakfast.   metoprolol succinate (TOPROL-XL) 50 MG 24 hr tablet Take 1 tablet (50 mg total) by  mouth 2 (two) times daily. Take with or immediately following a meal.     Allergies:   Patient has no known allergies.   Social History   Socioeconomic History   Marital status: Divorced    Spouse name: Not on file   Number of children: Not on file   Years of education: Not on file   Highest education level: Not on file  Occupational History   Not on file  Tobacco Use   Smoking status: Never   Smokeless tobacco: Never  Substance and Sexual Activity   Alcohol use: No   Drug use: No   Sexual activity: Not on file  Other Topics Concern   Not on file  Social History Narrative   Not on file   Social Determinants of Health   Financial Resource Strain: Not on file  Food Insecurity: Not on file  Transportation Needs: Not on file  Physical Activity: Not on file  Stress: Not on file  Social Connections: Not on file     Family History: The patient's family history includes Cancer in her sister, sister, and sister; Heart disease in her mother; Hypertension in her mother.  ROS:   Please see the history of present illness.     All other systems reviewed and are negative.  EKGs/Labs/Other Studies Reviewed:    The following studies were reviewed today:   EKG:  EKG is  ordered today.  The ekg ordered today demonstrates   Atrial Fibrillation rate 113. Qtc 440ms   Recent Labs: 11/29/2020: ALT 15; TSH 0.551 06/20/2021: Hemoglobin 14.3; Platelets 211 07/27/2021: BUN 14; Creatinine, Ser 0.81; Magnesium 2.1; Potassium 4.7; Sodium 141  Recent Lipid Panel    Component Value Date/Time   CHOL 201 (H) 11/29/2020 1115   TRIG 72 11/29/2020 1115   TRIG 83 06/03/2014 1104   HDL 76 11/29/2020 1115   HDL 77 06/03/2014 1104   CHOLHDL 2.6 11/29/2020 1115   LDLCALC 112 (H) 11/29/2020 1115   LDLCALC 109 (H) 02/17/2013 1134     Risk Assessment/Calculations:    CHA2DS2-VASc Score = 2   This indicates a 2.2% annual risk of stroke. The patient's score is based upon: CHF History: 0 HTN  History: 0 Diabetes History: 0 Stroke History: 0 Vascular Disease History: 0 Age Score: 1 Gender Score: 1       The 10-year ASCVD risk score (Arnett DK, et al., 2019) is: 7.1%   Values used to calculate the score:     Age: 32 years     Sex: Female     Is Non-Hispanic African American: No     Diabetic: No     Tobacco smoker: No     Systolic Blood Pressure: 546 mmHg     Is BP treated: Yes     HDL Cholesterol: 76 mg/dL     Total Cholesterol: 201 mg/dL   Physical Exam:    VS:   Vitals:   08/10/21 1109  BP: 126/84  Pulse: 64  SpO2: 97%     Wt Readings from Last 3 Encounters:  08/10/21 154 lb  9.6 oz (70.1 kg)  07/27/21 156 lb 6.4 oz (70.9 kg)  07/20/21 156 lb 1.4 oz (70.8 kg)     GEN:  Well nourished, well developed in no acute distress HEENT: Normal NECK: No JVD; No carotid bruits LYMPHATICS: No lymphadenopathy CARDIAC: regular rhythm ,no murmurs, rubs, gallops Vasc: faint radial pulses  RESPIRATORY:  Clear to auscultation without rales, wheezing or rhonchi  ABDOMEN: Soft, non-tender, non-distended MUSCULOSKELETAL:  No edema; No deformity  SKIN: Warm and dry NEUROLOGIC:  Alert and oriented x 3 PSYCHIATRIC:  Normal affect   ASSESSMENT:    #Persistent Atrial Fibrillation: continue aspirin 81 mg daily with CHADS2VASC 2. She is asymptomatic. She has hx of hypothyroid/hyperthyroid. Echo shows normal LV function. bi-atrial enlargement, no significant valve dx. Underwent Tikosyn load completed 07/21/2021.  She follows with afib clinic. Regular rhythm today. - continue Tikosyn - continue eliquis - continue metop XL  ?FMD/Vasculopathy:  She has evidence of aneurysm and stable dissection of the right ICA and beaded appearance of the cervical ICAs. She has obstructed flow of the R subclavian that is pre-occlusive. Her findings of suggestive of c/f vasculopathy or fibromuscular dysplasia.  Will plan for her to be seen by vascular surgery for further w/u and potential  intervention -continue metop per above -vascular surgery consult  PLAN:    In order of problems listed above:  Vascular Surgery consult If suspicion for FMD is high, will plan for head CTA as well as abd/pelvis  Follow up 6 months  Medication Adjustments/Labs and Tests Ordered: Current medicines are reviewed at length with the patient today.  Concerns regarding medicines are outlined above.    Signed, Janina Mayo, MD  08/10/2021 11:20 AM    [facility]

## 2021-08-16 ENCOUNTER — Other Ambulatory Visit: Payer: Self-pay | Admitting: Nurse Practitioner

## 2021-08-16 DIAGNOSIS — Z1231 Encounter for screening mammogram for malignant neoplasm of breast: Secondary | ICD-10-CM

## 2021-08-23 NOTE — Progress Notes (Signed)
? ? ?Office Visit  ?  ?Patient Name: Angela Dean ?Date of Encounter: 08/23/2021 ? ?Primary Care Provider:  Chevis Pretty, FNP ?Primary Cardiologist:  Janina Mayo, MD ? ?Chief Complaint  ?  ?1 month Tikosyn follow-up ? ?History of Present Illness  ?  ?Angela Dean 66 year old female with past medical history of persistent AF (Eliquis), Breast cancer s/p mastectomy, thyroid nodule s/p thyroidectomy.  Echo completed 11/22 with EF 50%, mild LVH, and bilateral atrial enlargement.  She had bilateral atrial duplex studies on 11/22 with RUE stenosis noted at >50%. DCCV attempted 12/27 x 3 attempts unsuccessful.  She was DCCV on 1/26 and converted to SB.  She was admitted on 1/24 for Tikosyn load with QTc in acceptable range and discharged on Tikosyn 250 mcg bid and metoprolol succinate 150 mg bid. She followed up in the AF clinic after one week and was in SR with no QT prolongation. ? ?Patient  presents today for 4-week Tikosyn follow-up. She denies any medication related side effects with the Tikosyn and has not missed any scheduled doses. She has two alarms set on her phone to remind her of her medication times. She  denies chest pain, shortness of breath, lower extremity edema, fatigue, palpitations, melena, hematuria, hemoptysis, diaphoresis, weakness, presyncope, syncope, orthopnea, and PND.  ? ?Past Medical History  ?  ?Past Medical History:  ?Diagnosis Date  ? Atrial fibrillation ([place])   ? Breast cancer ([place])   ? Cancer [facility])   ? Thyroid disease   ? ?Past Surgical History:  ?Procedure Laterality Date  ? CARDIOVERSION N/A 06/20/2021  ? Procedure: CARDIOVERSION;  Surgeon: Geralynn Rile, MD;  Location: [place];  Service: Cardiovascular;  Laterality: N/A;  ? CARDIOVERSION N/A 07/20/2021  ? Procedure: CARDIOVERSION;  Surgeon: Lelon Perla, MD;  Location: [facility] ENDOSCOPY;  Service: Cardiovascular;  Laterality: N/A;  ? MASTECTOMY Left 1997  ? ? ?Allergies ? ?No Known Allergies ? ? ? ?Home  Medications  ?  ?Current Outpatient Medications  ?Medication Sig Dispense Refill  ? Acetaminophen (TYLENOL PO) Take 500 mg by mouth as needed.    ? apixaban (ELIQUIS) 5 MG TABS tablet Take 1 tablet (5 mg total) by mouth 2 (two) times daily. 60 tablet 3  ? dofetilide (TIKOSYN) 250 MCG capsule Take 1 capsule (250 mcg total) by mouth 2 (two) times daily. 60 capsule 6  ? levothyroxine (SYNTHROID) 100 MCG tablet Take 1 tablet (100 mcg total) by mouth daily before breakfast. 30 tablet 11  ? metoprolol succinate (TOPROL-XL) 50 MG 24 hr tablet Take 1 tablet (50 mg total) by mouth 2 (two) times daily. Take with or immediately following a meal. 60 tablet 6  ? ?No current facility-administered medications for this visit.  ?  ? ?Review of Systems  ?Review of Systems  ?Cardiovascular:  Negative for chest pain, palpitations and leg swelling.  ?Gastrointestinal:  Negative for blood in stool and melena.  ?Genitourinary:  Negative for hematuria.  ?All other systems reviewed and are negative.  ? ?Physical Exam  ?  ?VS:  ?Vitals:  ? 08/24/21 1139  ?BP: 124/74  ?Pulse: 70  ?SpO2: 95%  ?  ? ?GEN: Well nourished, well developed, in no acute distress. ?HEENT: normal. ?Neck: Supple, no JVD, carotid bruits, or masses. ?Cardiac: RRR, no murmurs, rubs, or gallops. No clubbing, cyanosis, edema.  Radials/DP/PT 2+ and equal bilaterally.  ?Respiratory:  Respirations regular and unlabored, clear to auscultation bilaterally. ?GI: Soft, nontender, nondistended, BS + x 4. ?MS: no  deformity or atrophy. ?Skin: warm and dry, no rash. ?Neuro:  Strength and sensation are intact. ?Psych: Normal affect. ? ?Accessory Clinical Findings  ?  ?ECG personally reviewed by me today - NSR with a rate of 70 and QT 446  - no acute changes. ? ?Lab Results  ?Component Value Date  ? WBC 6.1 06/20/2021  ? HGB 14.3 06/20/2021  ? HCT 44.0 06/20/2021  ? MCV 90.3 06/20/2021  ? PLT 211 06/20/2021  ? ?Lab Results  ?Component Value Date  ? CREATININE 0.81 07/27/2021  ? BUN 14  07/27/2021  ? NA 141 07/27/2021  ? K 4.7 07/27/2021  ? CL 105 07/27/2021  ? CO2 28 07/27/2021  ? ?Lab Results  ?Component Value Date  ? ALT 15 11/29/2020  ? AST 17 11/29/2020  ? ALKPHOS 50 11/29/2020  ? BILITOT 0.7 11/29/2020  ? ?Lab Results  ?Component Value Date  ? CHOL 201 (H) 11/29/2020  ? HDL 76 11/29/2020  ? LDLCALC 112 (H) 11/29/2020  ? TRIG 72 11/29/2020  ? CHOLHDL 2.6 11/29/2020  ?  ?No results found for: HGBA1C ? ?Assessment & Plan  ?  ?1.  Persistent atrial fibrillation: ?- S/P patient Tikosyn load on 07/16/21, continue current dose of 250 mcg bid ?-QT  interval today is 446 with NSR, we discussed medication regimen and missed dose instructions. ?-MG and BMET today ?-Continue metoprolol succinate 150 mg bid ? ?2.  CHA2DS2-VASc score of 2 ?-Denies bleeding, continue Eliquis 5 mg bid ? ?Disposition: Follow-up with  Dr. Curt Bears in 6 months. ? ? ? ?Mable Fill, Marissa Nestle, NP ?08/23/2021, 3:03 PM ?    ?

## 2021-08-24 ENCOUNTER — Ambulatory Visit: Payer: BC Managed Care – PPO | Admitting: Nurse Practitioner

## 2021-08-24 ENCOUNTER — Encounter: Payer: Self-pay | Admitting: Nurse Practitioner

## 2021-08-24 ENCOUNTER — Ambulatory Visit
Admission: RE | Admit: 2021-08-24 | Discharge: 2021-08-24 | Disposition: A | Discharge status: Home / Self Care | Payer: BC Managed Care – PPO | Source: Ambulatory Visit | Attending: Nurse Practitioner | Admitting: Nurse Practitioner

## 2021-08-24 ENCOUNTER — Other Ambulatory Visit: Payer: Self-pay

## 2021-08-24 VITALS — BP 124/74 | HR 70 | Ht 64.5 in | Wt 155.2 lb

## 2021-08-24 DIAGNOSIS — Z1231 Encounter for screening mammogram for malignant neoplasm of breast: Secondary | ICD-10-CM | POA: Diagnosis not present

## 2021-08-24 DIAGNOSIS — I4819 Other persistent atrial fibrillation: Secondary | ICD-10-CM

## 2021-08-24 LAB — BASIC METABOLIC PANEL
BUN/Creatinine Ratio: 14 (ref 12–28)
BUN: 11 mg/dL (ref 8–27)
CO2: 27 mmol/L (ref 20–29)
Calcium: 9.9 mg/dL (ref 8.7–10.3)
Chloride: 103 mmol/L (ref 96–106)
Creatinine, Ser: 0.81 mg/dL (ref 0.57–1.00)
Glucose: 82 mg/dL (ref 70–99)
Potassium: 4.9 mmol/L (ref 3.5–5.2)
Sodium: 142 mmol/L (ref 134–144)
eGFR: 80 mL/min/{1.73_m2} (ref >59)

## 2021-08-24 LAB — MAGNESIUM: Magnesium: 2.1 mg/dL (ref 1.6–2.3)

## 2021-08-24 NOTE — Patient Instructions (Addendum)
Medication Instructions:  ?Your physician recommends that you continue on your current medications as directed. Please refer to the Current Medication list given to you today. ? ?*If you need a refill on your cardiac medications before your next appointment, please call your pharmacy* ? ? ?Lab Work: ?TODAY: BMET, [place]. ? ?If you have labs (blood work) drawn today and your tests are completely normal, you will receive your results only by: ?MyChart Message (if you have MyChart) OR ?A paper copy in the mail ?If you have any lab test that is abnormal or we need to change your treatment, we will call you to review the results. ? ? ?Follow-Up: ?At [facility], you and your health needs are our priority.  As part of our continuing mission to provide you with exceptional heart care, we have created designated Provider Care Teams.  These Care Teams include your primary Cardiologist (physician) and Advanced Practice Providers (APPs -  Physician Assistants and Nurse Practitioners) who all work together to provide you with the care you need, when you need it. ? ?Your next appointment:   ?6 month(s) ? ?The format for your next appointment:   ?In Person ? ?Provider:   ?Allegra Lai, MD ?

## 2021-09-07 ENCOUNTER — Ambulatory Visit: Payer: BC Managed Care – PPO | Admitting: Vascular Surgery

## 2021-09-07 ENCOUNTER — Other Ambulatory Visit (HOSPITAL_COMMUNITY): Payer: Self-pay | Admitting: Nurse Practitioner

## 2021-09-07 ENCOUNTER — Encounter: Payer: Self-pay | Admitting: Vascular Surgery

## 2021-09-07 ENCOUNTER — Other Ambulatory Visit: Payer: Self-pay

## 2021-09-07 VITALS — BP 112/78 | HR 52 | Temp 98.4°F | Resp 20 | Ht 64.5 in | Wt 154.8 lb

## 2021-09-07 DIAGNOSIS — I7771 Dissection of carotid artery: Secondary | ICD-10-CM | POA: Diagnosis not present

## 2021-09-07 MED ORDER — ATORVASTATIN CALCIUM 10 MG PO TABS
20.0000 mg | ORAL_TABLET | Freq: Every day | ORAL | Status: AC
Start: 2021-09-07 — End: 2021-11-06

## 2021-09-07 NOTE — Progress Notes (Signed)
? ?ASSESSMENT & PLAN  ? ?RIGHT SUBCLAVIAN ARTERY STENOSIS: This patient has a right subclavian artery stenosis on CT angiogram.  However this is asymptomatic.  She denies any pain in the arm and has had no vertebrobasilar symptoms.  I have instructed her to always have them take her blood pressure in her left arm.  She had a previous mastectomy on the left many years ago but I do not think this is a contraindication to taking the blood pressure in the left arm.  If she develops vertebrobasilar symptoms or arm pain then certainly we could reevaluate her for arteriography and possible intervention.  However for an asymptomatic subclavian stenosis I would not recommend an aggressive approach.  I have put her on Lipitor and sent a prescription to her pharmacy. ? ?RIGHT DISTAL INTERNAL CAROTID ARTERY DISSECTION: This patient has an asymptomatic chronic dissection of the distal right internal carotid artery.  She has had no specific injury to her neck and has been completely asymptomatic.  I think this was just an incidental finding.  She is on Eliquis for her A-fib.  I do not think any further work-up for this is indicated.  If she does come off of Eliquis that I would recommend 81 mg of aspirin daily.  In reviewing her films I am was not convinced that she has fibromuscular dysplasia although this is certainly possible.  Regardless it is asymptomatic and no further work-up is indicated.  Again if she develop new neurologic symptoms we could reevaluate her.  If she did develop new symptoms I would start with a carotid duplex scan. ? ?REASON FOR CONSULT:   ? ?Fibromuscular dysplasia of the internal carotid artery.  The consult is requested by Dr. Phineas Inches. ? ?HPI:  ? ?Angela Dean is a 66 y.o. female who was having problems getting her blood pressure in the right arm.  This prompted a CT angiogram which showed a focal stenosis of the right subclavian artery.  In addition an incidental finding was possible  fibromuscular dysplasia of the internal carotid arteries and a distal right internal carotid artery dissection.  She was sent for vascular consultation. ? ?She is right-handed.  She denies any history of stroke, TIAs, expressive or receptive aphasia, or amaurosis fugax.  She denies any right arm weakness.  She denies any dizziness or unsteady gait. ? ?She is on Eliquis for atrial fibrillation. ? ?Her risk factors for peripheral vascular disease include elevated cholesterol.  She denies any history of diabetes, hypertension, family history of premature cardiovascular disease, or tobacco use. ? ? ?Past Medical History:  ?Diagnosis Date  ? Atrial fibrillation ([place])   ? Breast cancer ([place])   ? Cancer [facility])   ? Thyroid disease   ? ? ?Family History  ?Problem Relation Age of Onset  ? Heart disease Mother   ? Hypertension Mother   ? Cancer Sister   ? Cancer Sister   ? Cancer Sister   ? ? ?SOCIAL HISTORY: ?Social History  ? ?Tobacco Use  ? Smoking status: Never  ? Smokeless tobacco: Never  ?Substance Use Topics  ? Alcohol use: No  ? ? ?No Known Allergies ? ?Current Outpatient Medications  ?Medication Sig Dispense Refill  ? Acetaminophen (TYLENOL PO) Take 500 mg by mouth as needed.    ? apixaban (ELIQUIS) 5 MG TABS tablet TAKE ONE TABLET TWICE DAILY 60 tablet 5  ? dofetilide (TIKOSYN) 250 MCG capsule Take 1 capsule (250 mcg total) by mouth 2 (two) times daily.  60 capsule 6  ? levothyroxine (SYNTHROID) 100 MCG tablet Take 1 tablet (100 mcg total) by mouth daily before breakfast. 30 tablet 11  ? metoprolol succinate (TOPROL-XL) 50 MG 24 hr tablet Take 1 tablet (50 mg total) by mouth 2 (two) times daily. Take with or immediately following a meal. 60 tablet 6  ? ?No current facility-administered medications for this visit.  ? ? ?REVIEW OF SYSTEMS:  ?'[X]'$  denotes positive finding, '[ ]'$  denotes negative finding ?Cardiac  Comments:  ?Chest pain or chest pressure:    ?Shortness of breath upon exertion:    ?Short of breath when lying  flat:    ?Irregular heart rhythm:    ?    ?Vascular    ?Pain in calf, thigh, or hip brought on by ambulation:    ?Pain in feet at night that wakes you up from your sleep:     ?Blood clot in your veins:    ?Leg swelling:     ?    ?Pulmonary    ?Oxygen at home:    ?Productive cough:     ?Wheezing:     ?    ?Neurologic    ?Sudden weakness in arms or legs:     ?Sudden numbness in arms or legs:     ?Sudden onset of difficulty speaking or slurred speech:    ?Temporary loss of vision in one eye:     ?Problems with dizziness:     ?    ?Gastrointestinal    ?Blood in stool:     ?Vomited blood:     ?    ?Genitourinary    ?Burning when urinating:     ?Blood in urine:    ?    ?Psychiatric    ?Major depression:     ?    ?Hematologic    ?Bleeding problems:    ?Problems with blood clotting too easily:    ?    ?Skin    ?Rashes or ulcers:    ?    ?Constitutional    ?Fever or chills:    ?- ? ?PHYSICAL EXAM:  ? ?Vitals:  ? 09/07/21 1043 09/07/21 1045  ?BP: (!) 166/83 112/78  ?Pulse: (!) 52   ?Resp: 20   ?Temp: 98.4 ?F (36.9 ?C)   ?SpO2: 97%   ?Weight: 154 lb 12.8 oz (70.2 kg)   ?Height: 5' 4.5" (1.638 m)   ? ?Body mass index is 26.16 kg/m?. ?GENERAL: The patient is a well-nourished female, in no acute distress. The vital signs are documented above. ?CARDIAC: There is a regular rate and rhythm.  ?VASCULAR: I do not detect carotid bruits. ?I cannot palpate a right radial pulse.  She does have a palpable left radial pulse. ?She has palpable femoral pulses and palpable posterior tibial pulses bilaterally. ?PULMONARY: There is good air exchange bilaterally without wheezing or rales. ?ABDOMEN: Soft and non-tender with normal pitched bowel sounds.  I do not palpate an aneurysm. ?MUSCULOSKELETAL: There are no major deformities. ?NEUROLOGIC: No focal weakness or paresthesias are detected. ?SKIN: There are no ulcers or rashes noted. ?PSYCHIATRIC: The patient has a normal affect. ? ?DATA:   ? ?CT ANGIOGRAM NECK: I have reviewed the images of the  CT angiogram of the neck that was done on 05/23/2021.  Radiology had interpreted some beaded appearance of the internal carotid artery possibly consistent with fibromuscular dysplasia.  I think these findings are subtle.  However, I think it does appear that she has a distal right ICA dissection that  is chronic in the cavernous portion of the ICA.  In my opinion this clearly looks like a dissection. ? ?LABS: I reviewed her labs from 08/24/2021.  Her creatinine is 0.8.  GFR greater than 60. ? ?Deitra Mayo ?[facility] ?

## 2021-09-07 NOTE — Telephone Encounter (Signed)
Eliquis '5mg'$  refill request received. Patient is 66 years old, weight-70.4kg, Crea-0.81 on 08/24/2021, Diagnosis-Afib, and last seen by Ambrose Pancoast, NP on 08/24/2021. Dose is appropriate based on dosing criteria. Will send in refill to requested pharmacy.   ?

## 2021-09-19 ENCOUNTER — Other Ambulatory Visit (HOSPITAL_COMMUNITY): Payer: Self-pay | Admitting: Nurse Practitioner

## 2021-09-27 ENCOUNTER — Other Ambulatory Visit: Payer: Self-pay | Admitting: Nurse Practitioner

## 2021-09-27 DIAGNOSIS — E039 Hypothyroidism, unspecified: Secondary | ICD-10-CM

## 2021-10-27 ENCOUNTER — Other Ambulatory Visit: Payer: Self-pay | Admitting: Nurse Practitioner

## 2021-10-27 ENCOUNTER — Other Ambulatory Visit (HOSPITAL_COMMUNITY): Payer: Self-pay | Admitting: Nurse Practitioner

## 2021-10-27 DIAGNOSIS — E039 Hypothyroidism, unspecified: Secondary | ICD-10-CM

## 2021-11-18 ENCOUNTER — Other Ambulatory Visit: Payer: Self-pay | Admitting: Nurse Practitioner

## 2021-11-18 DIAGNOSIS — E039 Hypothyroidism, unspecified: Secondary | ICD-10-CM

## 2021-11-30 ENCOUNTER — Telehealth: Payer: Self-pay | Admitting: Nurse Practitioner

## 2021-11-30 DIAGNOSIS — E039 Hypothyroidism, unspecified: Secondary | ICD-10-CM

## 2021-11-30 MED ORDER — LEVOTHYROXINE SODIUM 100 MCG PO TABS: ORAL_TABLET | ORAL | 0 refills | Status: DC

## 2021-11-30 NOTE — Telephone Encounter (Signed)
LMOVM refill sent to pharmacy 

## 2021-11-30 NOTE — Telephone Encounter (Signed)
  Prescription Request  11/30/2021  Is this a "Controlled Substance" medicine? no  Have you seen your PCP in the last 2 weeks? Appt made 6/27  If YES, route message to pool  -  If NO, patient needs to be scheduled for appointment.  What is the name of the medication or equipment? levothyroxine (SYNTHROID) 100 MCG tablet  Have you contacted your pharmacy to request a refill? yes   Which pharmacy would you like this sent to? [facility]    Can this be refilled for patient to have enough until appt?    Patient notified that their request is being sent to the clinical staff for review and that they should receive a response within 2 business days.

## 2021-12-07 ENCOUNTER — Ambulatory Visit (HOSPITAL_COMMUNITY)
Admission: RE | Admit: 2021-12-07 | Discharge: 2021-12-07 | Disposition: A | Discharge status: Home / Self Care | Payer: BC Managed Care – PPO | Source: Ambulatory Visit | Attending: Nurse Practitioner | Admitting: Nurse Practitioner

## 2021-12-07 ENCOUNTER — Encounter (HOSPITAL_COMMUNITY): Payer: Self-pay | Admitting: Nurse Practitioner

## 2021-12-07 VITALS — BP 160/104 | HR 95 | Ht 64.5 in | Wt 158.4 lb

## 2021-12-07 DIAGNOSIS — R030 Elevated blood-pressure reading, without diagnosis of hypertension: Secondary | ICD-10-CM | POA: Diagnosis not present

## 2021-12-07 DIAGNOSIS — Z7901 Long term (current) use of anticoagulants: Secondary | ICD-10-CM | POA: Diagnosis not present

## 2021-12-07 DIAGNOSIS — Z79899 Other long term (current) drug therapy: Secondary | ICD-10-CM | POA: Diagnosis not present

## 2021-12-07 DIAGNOSIS — Z853 Personal history of malignant neoplasm of breast: Secondary | ICD-10-CM | POA: Diagnosis not present

## 2021-12-07 DIAGNOSIS — I4819 Other persistent atrial fibrillation: Secondary | ICD-10-CM | POA: Diagnosis not present

## 2021-12-07 DIAGNOSIS — D6869 Other thrombophilia: Secondary | ICD-10-CM | POA: Diagnosis not present

## 2021-12-07 MED ORDER — METOPROLOL SUCCINATE ER 50 MG PO TB24: 50.0000 mg | ORAL_TABLET | Freq: Two times a day (BID) | ORAL | Status: DC

## 2021-12-07 NOTE — Progress Notes (Addendum)
Primary Care Physician: Chevis Pretty, FNP Referring Physician:Dr. Phineas Inches   Angela Dean is a 66 y.o. female  with a hx of atrial fibrillation CHADS2VASC 2 on aspirin, Thyrod nodule s/p thyroidectomy, breast cancer mastectomy on the L side, no radiation, no chemo   She is in the afib clinic for evaluation after being seen by Dr. Harl Bowie 10/31 for afib. She has RVR today at 115 bpm. She is asymptomatic. It appears that she is persistent. Al thought she has a CHA2DS2-VASc Score = 2, I discussed with pt to pursue restoring SR, it will likely mean pursing cardioversion and for this she will need to be on anticoagulation for at least 21 days prior to this. She does not drink alcohol, smoke, or snore.   F/u in afib clinic, 06/02/21. She remains in rate controlled afib. We discussed risk vrs benefit of pursing cardioversion and she would like to pursue. She did have a doppler for decreased blood flow to rt arm and by doppler was found to have an obstruction. She was directed to the ER for CT and the report read  rt subclavian may be severely stenotic or occluded( age indeterminate) but pt states her BP has not been audible in her rt arm for some time. She had her BB increased in the ER for elevated BP and was told to continue her eliquis.   We discussed pursing CV, risk vrs benefit  and she is in agreement. No missed anticoagulation since starting drug and the cardioversion are running almost 3 weeks out. Recent echo showed low normal EF with bilateral atria enlargement.  Follow up in the AF clinic 06/28/21. Patient is s/p DCCV on 06/20/21 which was unsuccessful after 3 attempts. Per procedure report, no sinus beats after any of the attempts. She is in rate controlled afib today. Remains asymptomatic.   Follow up in the AF clinic 07/18/21. Patient presents for dofetilide admission. She denies any missed doses of anticoagulation.   F/u 2/223. She is now one week out from Tikosyn loading and is in  SR. She has had some back pain since hospital stay, she feels from sitting and lying in bed so much. She is taking Tylenol for it and it is more positional in nature.   F/u in afib clinic, 12/07/21. She is in afib today, rate controlled. She is unaware of when she is in rhythm or in afib. She continues on Tikosyn 250 mcg bid.  BP is elevated today. Rechecked 160/104. Pt states under much stress taking care of her mother. BP usually does not run that high. BP has to be taken on left arm as she has a rt subclavian artery stenosis   Today, she denies symptoms of palpitations, chest pain, shortness of breath, orthopnea, PND, lower extremity edema, dizziness, presyncope, syncope, or neurologic sequela. The patient is tolerating medications without difficulties and is otherwise without complaint today.   Past Medical History:  Diagnosis Date   Atrial fibrillation ([place])    Breast cancer ([place])    Cancer ([place])    Thyroid disease    Past Surgical History:  Procedure Laterality Date   CARDIOVERSION N/A 06/20/2021   Procedure: CARDIOVERSION;  Surgeon: Geralynn Rile, MD;  Location: [facility];  Service: Cardiovascular;  Laterality: N/A;   CARDIOVERSION N/A 07/20/2021   Procedure: CARDIOVERSION;  Surgeon: Lelon Perla, MD;  Location: [facility] ENDOSCOPY;  Service: Cardiovascular;  Laterality: N/A;   MASTECTOMY Left 1997    Current Outpatient Medications  Medication Sig  Dispense Refill   Acetaminophen (TYLENOL PO) Take 500 mg by mouth as needed.     apixaban (ELIQUIS) 5 MG TABS tablet TAKE ONE TABLET TWICE DAILY 60 tablet 5   dofetilide (TIKOSYN) 250 MCG capsule TAKE ONE CAPSULE BY MOUTH TWICE A DAY 60 capsule 6   levothyroxine (SYNTHROID) 100 MCG tablet TAKE 1 TABLET DAILY 30 tablet 0   metoprolol succinate (TOPROL-XL) 50 MG 24 hr tablet Take 1 tablet (50 mg total) by mouth 2 (two) times daily. Take with or immediately following a meal.     No current facility-administered medications for this  encounter.    No Known Allergies  Social History   Socioeconomic History   Marital status: Divorced    Spouse name: Not on file   Number of children: Not on file   Years of education: Not on file   Highest education level: Not on file  Occupational History   Not on file  Tobacco Use   Smoking status: Never   Smokeless tobacco: Never  Substance and Sexual Activity   Alcohol use: No   Drug use: No   Sexual activity: Not on file  Other Topics Concern   Not on file  Social History Narrative   Not on file   Social Determinants of Health   Financial Resource Strain: Not on file  Food Insecurity: Not on file  Transportation Needs: Not on file  Physical Activity: Not on file  Stress: Not on file  Social Connections: Not on file  Intimate Partner Violence: Not on file    Family History  Problem Relation Age of Onset   Heart disease Mother    Hypertension Mother    Cancer Sister    Cancer Sister    Cancer Sister     ROS- All systems are reviewed and negative except as per the HPI above  Physical Exam: Vitals:   12/07/21 1427  Weight: 71.8 kg  Height: 5' 4.5" (1.638 m)     Wt Readings from Last 3 Encounters:  12/07/21 71.8 kg  09/07/21 70.2 kg  08/24/21 70.4 kg    Labs: Lab Results  Component Value Date   NA 142 08/24/2021   K 4.9 08/24/2021   CL 103 08/24/2021   CO2 27 08/24/2021   GLUCOSE 82 08/24/2021   BUN 11 08/24/2021   CREATININE 0.81 08/24/2021   CALCIUM 9.9 08/24/2021   MG 2.1 08/24/2021   Lab Results  Component Value Date   INR 1.0 07/20/2021   Lab Results  Component Value Date   CHOL 201 (H) 11/29/2020   HDL 76 11/29/2020   LDLCALC 112 (H) 11/29/2020   TRIG 72 11/29/2020    GEN- The patient is a well appearing female, alert and oriented x 3 today.   HEENT-head normocephalic, atraumatic, sclera clear, conjunctiva pink, hearing intact, trachea midline. Lungs- Clear to ausculation bilaterally, normal work of breathing Heart-  irregular rate and rhythm, no murmurs, rubs or gallops  GI- soft, NT, ND, + BS Extremities- no clubbing, cyanosis, or edema MS- no significant deformity or atrophy Skin- no rash or lesion Psych- euthymic mood, full affect Neuro- strength and sensation are intact   Ekg-Vent. rate 95 BPM PR interval * ms QRS duration 74 ms QT/QTcB 372/467 ms P-R-T axes * 53 -6 Atrial fibrillation Nonspecific T wave abnormality Abnormal ECG When compared with ECG of 27-Jul-2021 10:41, PREVIOUS ECG IS PRESENT   Echo-  05/16/2021 1. Left ventricular ejection fraction, by estimation, is 50% with beat to  beat variablity in afib. The left ventricle has low normal function. The left ventricle has no regional wall motion abnormalities. There is mild left ventricular hypertrophy. Left  ventricular diastolic parameters are indeterminate.   2. Right ventricular systolic function is normal. The right ventricular size is normal. There is normal pulmonary artery systolic pressure. The estimated right ventricular systolic pressure is 49.8 mmHg.   3. Left atrial size was severely dilated.   4. Right atrial size was severely dilated.   5. The mitral valve is grossly normal. Mild mitral valve regurgitation. No evidence of mitral stenosis.   6. The aortic valve is tricuspid. There is mild thickening of the aortic valve. Aortic valve regurgitation is mild. No aortic stenosis is present.   7. The inferior vena cava is normal in size with greater than 50% respiratory variability, suggesting right atrial pressure of 3 mmHg.   Assessment and Plan:  1. Persistent Afib  S/p DCCV on 06/20/21 which was unsuccessful. Patient is now s/p dofetilide admission, maintaining  SR  Continue 250 mcg bid  She is out of rhythm today. She can not tell if in SR or afib Will place a one week zio patch If persistent, will discuss cardioversion vrs pursing afib ablation on return visit  Continue Eliquis 5 mg BID  Continue metoprolol  succinate 150 mg bid   2. CHA2DS2VASc  score of 2 Continue eliquis 5 mg BID  3. Elevated BP Avoid salt  Try to check later at home to assure it returned to her baseline  I will call with further plans when monitor reviewed     Butch Penny C. Neo Yepiz, [facility] [address] [place], [place] [address] [phone]

## 2021-12-07 NOTE — Addendum Note (Signed)
Encounter addended by: Sherran Needs, NP on: 12/07/2021 3:33 PM  Actions taken: Clinical Note Signed

## 2021-12-19 ENCOUNTER — Encounter: Payer: Self-pay | Admitting: Nurse Practitioner

## 2021-12-19 ENCOUNTER — Ambulatory Visit (INDEPENDENT_AMBULATORY_CARE_PROVIDER_SITE_OTHER): Payer: BC Managed Care – PPO | Admitting: Nurse Practitioner

## 2021-12-19 VITALS — BP 148/78 | HR 112 | Temp 97.9°F | Resp 20 | Ht 64.0 in | Wt 158.0 lb

## 2021-12-19 DIAGNOSIS — I4819 Other persistent atrial fibrillation: Secondary | ICD-10-CM

## 2021-12-19 DIAGNOSIS — E039 Hypothyroidism, unspecified: Secondary | ICD-10-CM

## 2021-12-19 DIAGNOSIS — E7800 Pure hypercholesterolemia, unspecified: Secondary | ICD-10-CM

## 2021-12-19 DIAGNOSIS — D6869 Other thrombophilia: Secondary | ICD-10-CM | POA: Diagnosis not present

## 2021-12-19 DIAGNOSIS — I10 Essential (primary) hypertension: Secondary | ICD-10-CM

## 2021-12-19 DIAGNOSIS — Z1212 Encounter for screening for malignant neoplasm of rectum: Secondary | ICD-10-CM

## 2021-12-19 DIAGNOSIS — Z1211 Encounter for screening for malignant neoplasm of colon: Secondary | ICD-10-CM

## 2021-12-19 MED ORDER — LISINOPRIL 10 MG PO TABS: 10.0000 mg | ORAL_TABLET | Freq: Every day | ORAL | 1 refills | Status: DC

## 2021-12-19 MED ORDER — ATORVASTATIN CALCIUM 40 MG PO TABS: 40.0000 mg | ORAL_TABLET | Freq: Every day | ORAL | 5 refills | Status: DC

## 2021-12-20 LAB — LIPID PANEL
Chol/HDL Ratio: 2.8 ratio (ref 0.0–4.4)
Cholesterol, Total: 177 mg/dL (ref 100–199)
HDL: 63 mg/dL (ref >39)
LDL Chol Calc (NIH): 98 mg/dL (ref 0–99)
Triglycerides: 87 mg/dL (ref 0–149)
VLDL Cholesterol Cal: 16 mg/dL (ref 5–40)

## 2021-12-20 LAB — CBC WITH DIFFERENTIAL/PLATELET
Basophils Absolute: 0 10*3/uL (ref 0.0–0.2)
Basos: 0 %
EOS (ABSOLUTE): 0.1 10*3/uL (ref 0.0–0.4)
Eos: 1 %
Hematocrit: 43.8 % (ref 34.0–46.6)
Hemoglobin: 14.6 g/dL (ref 11.1–15.9)
Immature Grans (Abs): 0 10*3/uL (ref 0.0–0.1)
Immature Granulocytes: 0 %
Lymphocytes Absolute: 2.1 10*3/uL (ref 0.7–3.1)
Lymphs: 29 %
MCH: 29.3 pg (ref 26.6–33.0)
MCHC: 33.3 g/dL (ref 31.5–35.7)
MCV: 88 fL (ref 79–97)
Monocytes Absolute: 0.5 10*3/uL (ref 0.1–0.9)
Monocytes: 7 %
Neutrophils Absolute: 4.7 10*3/uL (ref 1.4–7.0)
Neutrophils: 63 %
Platelets: 269 10*3/uL (ref 150–450)
RBC: 4.99 x10E6/uL (ref 3.77–5.28)
RDW: 13.6 % (ref 11.7–15.4)
WBC: 7.5 10*3/uL (ref 3.4–10.8)

## 2021-12-20 LAB — THYROID PANEL WITH TSH
Free Thyroxine Index: 3.6 (ref 1.2–4.9)
T3 Uptake Ratio: 31 % (ref 24–39)
T4, Total: 11.7 ug/dL (ref 4.5–12.0)
TSH: 0.874 u[IU]/mL (ref 0.450–4.500)

## 2021-12-20 LAB — CMP14+EGFR
ALT: 16 IU/L (ref 0–32)
AST: 16 IU/L (ref 0–40)
Albumin/Globulin Ratio: 1.8 (ref 1.2–2.2)
Albumin: 4.4 g/dL (ref 3.8–4.8)
Alkaline Phosphatase: 54 IU/L (ref 44–121)
BUN/Creatinine Ratio: 15 (ref 12–28)
BUN: 11 mg/dL (ref 8–27)
Bilirubin Total: 0.6 mg/dL (ref 0.0–1.2)
CO2: 22 mmol/L (ref 20–29)
Calcium: 9.7 mg/dL (ref 8.7–10.3)
Chloride: 106 mmol/L (ref 96–106)
Creatinine, Ser: 0.74 mg/dL (ref 0.57–1.00)
Globulin, Total: 2.5 g/dL (ref 1.5–4.5)
Glucose: 93 mg/dL (ref 70–99)
Potassium: 4.2 mmol/L (ref 3.5–5.2)
Sodium: 142 mmol/L (ref 134–144)
Total Protein: 6.9 g/dL (ref 6.0–8.5)
eGFR: 89 mL/min/{1.73_m2} (ref >59)

## 2021-12-22 ENCOUNTER — Telehealth (HOSPITAL_COMMUNITY): Payer: Self-pay | Admitting: *Deleted

## 2021-12-22 NOTE — Telephone Encounter (Signed)
-----   Message from Sherran Needs, NP sent at 12/21/2021 10:00 AM EDT ----- Pt needs f/u visit for 100% afib on monitor to discuss CV vrs ablation.  ----- Message ----- From: Juluis Mire, RN Sent: 12/21/2021   9:09 AM EDT To: Sherran Needs, NP

## 2021-12-22 NOTE — Telephone Encounter (Signed)
Left message

## 2021-12-29 NOTE — Telephone Encounter (Signed)
Pt to secure transportation then will call back to schedule follow up

## 2022-01-04 ENCOUNTER — Other Ambulatory Visit: Payer: Self-pay | Admitting: Nurse Practitioner

## 2022-01-04 DIAGNOSIS — E039 Hypothyroidism, unspecified: Secondary | ICD-10-CM

## 2022-01-12 ENCOUNTER — Encounter (HOSPITAL_COMMUNITY): Payer: Self-pay | Admitting: Physician Assistant

## 2022-01-12 ENCOUNTER — Ambulatory Visit (HOSPITAL_COMMUNITY)
Admission: RE | Admit: 2022-01-12 | Discharge: 2022-01-12 | Disposition: A | Discharge status: Home / Self Care | Payer: No Typology Code available for payment source | Source: Ambulatory Visit | Attending: Physician Assistant | Admitting: Physician Assistant

## 2022-01-12 VITALS — BP 122/80 | HR 100 | Ht 64.0 in | Wt 157.2 lb

## 2022-01-12 DIAGNOSIS — Z7901 Long term (current) use of anticoagulants: Secondary | ICD-10-CM | POA: Diagnosis not present

## 2022-01-12 DIAGNOSIS — I4819 Other persistent atrial fibrillation: Secondary | ICD-10-CM | POA: Diagnosis present

## 2022-01-12 DIAGNOSIS — Z853 Personal history of malignant neoplasm of breast: Secondary | ICD-10-CM | POA: Diagnosis not present

## 2022-01-12 DIAGNOSIS — Z79899 Other long term (current) drug therapy: Secondary | ICD-10-CM | POA: Insufficient documentation

## 2022-01-12 DIAGNOSIS — I517 Cardiomegaly: Secondary | ICD-10-CM | POA: Diagnosis not present

## 2022-01-12 LAB — CBC
HCT: 44.4 % (ref 36.0–46.0)
Hemoglobin: 14.3 g/dL (ref 12.0–15.0)
MCH: 29.1 pg (ref 26.0–34.0)
MCHC: 32.2 g/dL (ref 30.0–36.0)
MCV: 90.2 fL (ref 80.0–100.0)
Platelets: 234 10*3/uL (ref 150–400)
RBC: 4.92 MIL/uL (ref 3.87–5.11)
RDW: 13.2 % (ref 11.5–15.5)
WBC: 8.3 10*3/uL (ref 4.0–10.5)
nRBC: 0 % (ref 0.0–0.2)

## 2022-01-12 LAB — BASIC METABOLIC PANEL
Anion gap: 8 (ref 5–15)
BUN: 10 mg/dL (ref 8–23)
CO2: 24 mmol/L (ref 22–32)
Calcium: 9.7 mg/dL (ref 8.9–10.3)
Chloride: 107 mmol/L (ref 98–111)
Creatinine, Ser: 0.85 mg/dL (ref 0.44–1.00)
GFR, Estimated: >60 mL/min (ref >60)
Glucose, Bld: 91 mg/dL (ref 70–99)
Potassium: 5.1 mmol/L (ref 3.5–5.1)
Sodium: 139 mmol/L (ref 135–145)

## 2022-01-12 LAB — MAGNESIUM: Magnesium: 2.3 mg/dL (ref 1.7–2.4)

## 2022-01-12 NOTE — Patient Instructions (Signed)
Cardioversion scheduled for Tuesday, August 1st  - Arrive at the [facility] and go to admitting at 1130am  - Do not eat or drink anything after midnight the night prior to your procedure.  - Take all your morning medication (except diabetic medications) with a sip of water prior to arrival.  - You will not be able to drive home after your procedure.  - Do NOT miss any doses of your blood thinner - if you should miss a dose please notify our office immediately.  - If you feel as if you go back into normal rhythm prior to scheduled cardioversion, please notify our office immediately. If your procedure is canceled in the cardioversion suite you will be charged a cancellation fee.

## 2022-01-12 NOTE — H&P (View-Only) (Signed)
Primary Care Physician: Chevis Pretty, FNP Referring Physician:Dr. Phineas Inches   Angela Dean is a 66 y.o. female  with a hx of atrial fibrillation CHADS2VASC 2 on aspirin, Thyrod nodule s/p thyroidectomy, breast cancer mastectomy on the L side, no radiation, no chemo   She is in the afib clinic for evaluation after being seen by Dr. Harl Bowie 10/31 for afib. She has RVR today at 115 bpm. She is asymptomatic. It appears that she is persistent. Al thought she has a CHA2DS2-VASc Score = 2, I discussed with pt to pursue restoring SR, it will likely mean pursing cardioversion and for this she will need to be on anticoagulation for at least 21 days prior to this. She does not drink alcohol, smoke, or snore.   F/u in afib clinic, 06/02/21. She remains in rate controlled afib. We discussed risk vrs benefit of pursing cardioversion and she would like to pursue. She did have a doppler for decreased blood flow to rt arm and by doppler was found to have an obstruction. She was directed to the ER for CT and the report read  rt subclavian may be severely stenotic or occluded( age indeterminate) but pt states her BP has not been audible in her rt arm for some time. She had her BB increased in the ER for elevated BP and was told to continue her eliquis.   We discussed pursing CV, risk vrs benefit  and she is in agreement. No missed anticoagulation since starting drug and the cardioversion are running almost 3 weeks out. Recent echo showed low normal EF with bilateral atria enlargement.  Follow up in the AF clinic 06/28/21. Patient is s/p DCCV on 06/20/21 which was unsuccessful after 3 attempts. Per procedure report, no sinus beats after any of the attempts. She is in rate controlled afib today. Remains asymptomatic.   Follow up in the AF clinic 07/18/21. Patient presents for dofetilide admission. She denies any missed doses of anticoagulation.   F/u 2/223. She is now one week out from Tikosyn loading and is in  SR. She has had some back pain since hospital stay, she feels from sitting and lying in bed so much. She is taking Tylenol for it and it is more positional in nature.   F/u in afib clinic, 12/07/21. She is in afib today, rate controlled. She is unaware of when she is in rhythm or in afib. She continues on Tikosyn 250 mcg bid.  BP is elevated today. Rechecked 160/104. Pt states under much stress taking care of her mother. BP usually does not run that high. BP has to be taken on left arm as she has a rt subclavian artery stenosis  Follow up in the AF clinic 01/12/22. Patient remains in persistent afib today. Zio patch showed 100% afib burden. She is completely unaware of her arrhythmia and feels "better than I have in years."    Today, she denies symptoms of palpitations, chest pain, shortness of breath, orthopnea, PND, lower extremity edema, dizziness, presyncope, syncope, or neurologic sequela. The patient is tolerating medications without difficulties and is otherwise without complaint today.   Past Medical History:  Diagnosis Date   Atrial fibrillation ([place])    Breast cancer ([place])    Cancer ([place])    Thyroid disease    Past Surgical History:  Procedure Laterality Date   CARDIOVERSION N/A 06/20/2021   Procedure: CARDIOVERSION;  Surgeon: Geralynn Rile, MD;  Location: [facility];  Service: Cardiovascular;  Laterality: N/A;   CARDIOVERSION  N/A 07/20/2021   Procedure: CARDIOVERSION;  Surgeon: Lelon Perla, MD;  Location: [facility] ENDOSCOPY;  Service: Cardiovascular;  Laterality: N/A;   MASTECTOMY Left 1997    Current Outpatient Medications  Medication Sig Dispense Refill   Acetaminophen (TYLENOL PO) Take 500 mg by mouth as needed.     apixaban (ELIQUIS) 5 MG TABS tablet TAKE ONE TABLET TWICE DAILY 60 tablet 5   atorvastatin (LIPITOR) 40 MG tablet Take 1 tablet (40 mg total) by mouth daily. 30 tablet 5   dofetilide (TIKOSYN) 250 MCG capsule TAKE ONE CAPSULE BY MOUTH TWICE A DAY 60  capsule 6   levothyroxine (SYNTHROID) 100 MCG tablet TAKE ONE TABLET ONCE DAILY 90 tablet 0   lisinopril (ZESTRIL) 10 MG tablet Take 1 tablet (10 mg total) by mouth daily. 90 tablet 1   metoprolol succinate (TOPROL-XL) 50 MG 24 hr tablet Take 1 tablet (50 mg total) by mouth 2 (two) times daily. Take with or immediately following a meal.     No current facility-administered medications for this encounter.    No Known Allergies  Social History   Socioeconomic History   Marital status: Divorced    Spouse name: Not on file   Number of children: Not on file   Years of education: Not on file   Highest education level: Not on file  Occupational History   Not on file  Tobacco Use   Smoking status: Never   Smokeless tobacco: Never   Tobacco comments:    Never smoke 01/12/22  Substance and Sexual Activity   Alcohol use: No   Drug use: No   Sexual activity: Not on file  Other Topics Concern   Not on file  Social History Narrative   Not on file   Social Determinants of Health   Financial Resource Strain: Not on file  Food Insecurity: Not on file  Transportation Needs: Not on file  Physical Activity: Not on file  Stress: Not on file  Social Connections: Not on file  Intimate Partner Violence: Not on file    Family History  Problem Relation Age of Onset   Heart disease Mother    Hypertension Mother    Cancer Sister    Cancer Sister    Cancer Sister     ROS- All systems are reviewed and negative except as per the HPI above  Physical Exam: Vitals:   01/12/22 1117  BP: 122/80  Pulse: 100  Weight: 71.3 kg  Height: '5\' 4"'$  (1.626 m)    Wt Readings from Last 3 Encounters:  01/12/22 71.3 kg  12/19/21 71.7 kg  12/07/21 71.8 kg    Labs: Lab Results  Component Value Date   NA 142 12/19/2021   K 4.2 12/19/2021   CL 106 12/19/2021   CO2 22 12/19/2021   GLUCOSE 93 12/19/2021   BUN 11 12/19/2021   CREATININE 0.74 12/19/2021   CALCIUM 9.7 12/19/2021   MG 2.1  08/24/2021   Lab Results  Component Value Date   INR 1.0 07/20/2021   Lab Results  Component Value Date   CHOL 177 12/19/2021   HDL 63 12/19/2021   LDLCALC 98 12/19/2021   TRIG 87 12/19/2021    GEN- The patient is a well appearing female, alert and oriented x 3 today.   HEENT-head normocephalic, atraumatic, sclera clear, conjunctiva pink, hearing intact, trachea midline. Lungs- Clear to ausculation bilaterally, normal work of breathing Heart- irregular rate and rhythm, no murmurs, rubs or gallops  GI- soft, NT, ND, +  BS Extremities- no clubbing, cyanosis, or edema MS- no significant deformity or atrophy Skin- no rash or lesion Psych- euthymic mood, full affect Neuro- strength and sensation are intact   Ekg- Afib  Vent. rate 100 BPM PR interval * ms QRS duration 76 ms QT/QTcB 384/495 ms  Echo-  05/16/2021 1. Left ventricular ejection fraction, by estimation, is 50% with beat to beat variablity in afib. The left ventricle has low normal function. The left ventricle has no regional wall motion abnormalities. There is mild left ventricular hypertrophy. Left  ventricular diastolic parameters are indeterminate.   2. Right ventricular systolic function is normal. The right ventricular size is normal. There is normal pulmonary artery systolic pressure. The estimated right ventricular systolic pressure is 12.7 mmHg.   3. Left atrial size was severely dilated.   4. Right atrial size was severely dilated.   5. The mitral valve is grossly normal. Mild mitral valve regurgitation. No evidence of mitral stenosis.   6. The aortic valve is tricuspid. There is mild thickening of the aortic valve. Aortic valve regurgitation is mild. No aortic stenosis is present.   7. The inferior vena cava is normal in size with greater than 50% respiratory variability, suggesting right atrial pressure of 3 mmHg.   Assessment and Plan:  1. Persistent Afib  S/p DCCV on 06/20/21 which was  unsuccessful. Patient is now s/p dofetilide admission Continue dofetilide 250 mcg bid  Zio patch showed 100% afib burden. We discussed EAST AF NET trial today and implications of rate vs rhythm control. Would still favor rhythm control given her young age despite paucity of symptoms. Will plan for DCCV since she is persistent. We also discussed long term rhythm control. She is agreeable to consultation with EP to discuss options. She has severe biatrial enlargement.  Continue Eliquis 5 mg BID  Continue metoprolol succinate 50 mg bid  Check bmet/mag/cbc today.  2. CHA2DS2VASc  score of 2 Continue eliquis 5 mg BID   Follow up with Dr Curt Bears post DCCV to discuss options.     [facility] [address] [place], [place] [address] [phone]

## 2022-01-12 NOTE — Progress Notes (Signed)
Primary Care Physician: Chevis Pretty, FNP Referring Physician:Dr. Phineas Inches   Angela Dean is a 66 y.o. female  with a hx of atrial fibrillation CHADS2VASC 2 on aspirin, Thyrod nodule s/p thyroidectomy, breast cancer mastectomy on the L side, no radiation, no chemo   She is in the afib clinic for evaluation after being seen by Dr. Harl Bowie 10/31 for afib. She has RVR today at 115 bpm. She is asymptomatic. It appears that she is persistent. Al thought she has a CHA2DS2-VASc Score = 2, I discussed with pt to pursue restoring SR, it will likely mean pursing cardioversion and for this she will need to be on anticoagulation for at least 21 days prior to this. She does not drink alcohol, smoke, or snore.   F/u in afib clinic, 06/02/21. She remains in rate controlled afib. We discussed risk vrs benefit of pursing cardioversion and she would like to pursue. She did have a doppler for decreased blood flow to rt arm and by doppler was found to have an obstruction. She was directed to the ER for CT and the report read  rt subclavian may be severely stenotic or occluded( age indeterminate) but pt states her BP has not been audible in her rt arm for some time. She had her BB increased in the ER for elevated BP and was told to continue her eliquis.   We discussed pursing CV, risk vrs benefit  and she is in agreement. No missed anticoagulation since starting drug and the cardioversion are running almost 3 weeks out. Recent echo showed low normal EF with bilateral atria enlargement.  Follow up in the AF clinic 06/28/21. Patient is s/p DCCV on 06/20/21 which was unsuccessful after 3 attempts. Per procedure report, no sinus beats after any of the attempts. She is in rate controlled afib today. Remains asymptomatic.   Follow up in the AF clinic 07/18/21. Patient presents for dofetilide admission. She denies any missed doses of anticoagulation.   F/u 2/223. She is now one week out from Tikosyn loading and is in  SR. She has had some back pain since hospital stay, she feels from sitting and lying in bed so much. She is taking Tylenol for it and it is more positional in nature.   F/u in afib clinic, 12/07/21. She is in afib today, rate controlled. She is unaware of when she is in rhythm or in afib. She continues on Tikosyn 250 mcg bid.  BP is elevated today. Rechecked 160/104. Pt states under much stress taking care of her mother. BP usually does not run that high. BP has to be taken on left arm as she has a rt subclavian artery stenosis  Follow up in the AF clinic 01/12/22. Patient remains in persistent afib today. Zio patch showed 100% afib burden. She is completely unaware of her arrhythmia and feels "better than I have in years."    Today, she denies symptoms of palpitations, chest pain, shortness of breath, orthopnea, PND, lower extremity edema, dizziness, presyncope, syncope, or neurologic sequela. The patient is tolerating medications without difficulties and is otherwise without complaint today.   Past Medical History:  Diagnosis Date   Atrial fibrillation ([place])    Breast cancer ([place])    Cancer ([place])    Thyroid disease    Past Surgical History:  Procedure Laterality Date   CARDIOVERSION N/A 06/20/2021   Procedure: CARDIOVERSION;  Surgeon: Geralynn Rile, MD;  Location: [facility];  Service: Cardiovascular;  Laterality: N/A;   CARDIOVERSION  N/A 07/20/2021   Procedure: CARDIOVERSION;  Surgeon: Lelon Perla, MD;  Location: [facility] ENDOSCOPY;  Service: Cardiovascular;  Laterality: N/A;   MASTECTOMY Left 1997    Current Outpatient Medications  Medication Sig Dispense Refill   Acetaminophen (TYLENOL PO) Take 500 mg by mouth as needed.     apixaban (ELIQUIS) 5 MG TABS tablet TAKE ONE TABLET TWICE DAILY 60 tablet 5   atorvastatin (LIPITOR) 40 MG tablet Take 1 tablet (40 mg total) by mouth daily. 30 tablet 5   dofetilide (TIKOSYN) 250 MCG capsule TAKE ONE CAPSULE BY MOUTH TWICE A DAY 60  capsule 6   levothyroxine (SYNTHROID) 100 MCG tablet TAKE ONE TABLET ONCE DAILY 90 tablet 0   lisinopril (ZESTRIL) 10 MG tablet Take 1 tablet (10 mg total) by mouth daily. 90 tablet 1   metoprolol succinate (TOPROL-XL) 50 MG 24 hr tablet Take 1 tablet (50 mg total) by mouth 2 (two) times daily. Take with or immediately following a meal.     No current facility-administered medications for this encounter.    No Known Allergies  Social History   Socioeconomic History   Marital status: Divorced    Spouse name: Not on file   Number of children: Not on file   Years of education: Not on file   Highest education level: Not on file  Occupational History   Not on file  Tobacco Use   Smoking status: Never   Smokeless tobacco: Never   Tobacco comments:    Never smoke 01/12/22  Substance and Sexual Activity   Alcohol use: No   Drug use: No   Sexual activity: Not on file  Other Topics Concern   Not on file  Social History Narrative   Not on file   Social Determinants of Health   Financial Resource Strain: Not on file  Food Insecurity: Not on file  Transportation Needs: Not on file  Physical Activity: Not on file  Stress: Not on file  Social Connections: Not on file  Intimate Partner Violence: Not on file    Family History  Problem Relation Age of Onset   Heart disease Mother    Hypertension Mother    Cancer Sister    Cancer Sister    Cancer Sister     ROS- All systems are reviewed and negative except as per the HPI above  Physical Exam: Vitals:   01/12/22 1117  BP: 122/80  Pulse: 100  Weight: 71.3 kg  Height: '5\' 4"'$  (1.626 m)    Wt Readings from Last 3 Encounters:  01/12/22 71.3 kg  12/19/21 71.7 kg  12/07/21 71.8 kg    Labs: Lab Results  Component Value Date   NA 142 12/19/2021   K 4.2 12/19/2021   CL 106 12/19/2021   CO2 22 12/19/2021   GLUCOSE 93 12/19/2021   BUN 11 12/19/2021   CREATININE 0.74 12/19/2021   CALCIUM 9.7 12/19/2021   MG 2.1  08/24/2021   Lab Results  Component Value Date   INR 1.0 07/20/2021   Lab Results  Component Value Date   CHOL 177 12/19/2021   HDL 63 12/19/2021   LDLCALC 98 12/19/2021   TRIG 87 12/19/2021    GEN- The patient is a well appearing female, alert and oriented x 3 today.   HEENT-head normocephalic, atraumatic, sclera clear, conjunctiva pink, hearing intact, trachea midline. Lungs- Clear to ausculation bilaterally, normal work of breathing Heart- irregular rate and rhythm, no murmurs, rubs or gallops  GI- soft, NT, ND, +  BS Extremities- no clubbing, cyanosis, or edema MS- no significant deformity or atrophy Skin- no rash or lesion Psych- euthymic mood, full affect Neuro- strength and sensation are intact   Ekg- Afib  Vent. rate 100 BPM PR interval * ms QRS duration 76 ms QT/QTcB 384/495 ms  Echo-  05/16/2021 1. Left ventricular ejection fraction, by estimation, is 50% with beat to beat variablity in afib. The left ventricle has low normal function. The left ventricle has no regional wall motion abnormalities. There is mild left ventricular hypertrophy. Left  ventricular diastolic parameters are indeterminate.   2. Right ventricular systolic function is normal. The right ventricular size is normal. There is normal pulmonary artery systolic pressure. The estimated right ventricular systolic pressure is 01.7 mmHg.   3. Left atrial size was severely dilated.   4. Right atrial size was severely dilated.   5. The mitral valve is grossly normal. Mild mitral valve regurgitation. No evidence of mitral stenosis.   6. The aortic valve is tricuspid. There is mild thickening of the aortic valve. Aortic valve regurgitation is mild. No aortic stenosis is present.   7. The inferior vena cava is normal in size with greater than 50% respiratory variability, suggesting right atrial pressure of 3 mmHg.   Assessment and Plan:  1. Persistent Afib  S/p DCCV on 06/20/21 which was  unsuccessful. Patient is now s/p dofetilide admission Continue dofetilide 250 mcg bid  Zio patch showed 100% afib burden. We discussed EAST AF NET trial today and implications of rate vs rhythm control. Would still favor rhythm control given her young age despite paucity of symptoms. Will plan for DCCV since she is persistent. We also discussed long term rhythm control. She is agreeable to consultation with EP to discuss options. She has severe biatrial enlargement.  Continue Eliquis 5 mg BID  Continue metoprolol succinate 50 mg bid  Check bmet/mag/cbc today.  2. CHA2DS2VASc  score of 2 Continue eliquis 5 mg BID   Follow up with Dr Curt Bears post DCCV to discuss options.     [facility] [address] [place], [place] [address] [phone]

## 2022-01-16 ENCOUNTER — Encounter (HOSPITAL_COMMUNITY): Payer: Self-pay | Admitting: Cardiovascular Disease

## 2022-01-16 NOTE — Progress Notes (Signed)
Attempted to obtain medical history via telephone, unable to reach at this time. HIPAA compliant voicemail message left requesting return call to pre surgical testing department. 

## 2022-01-23 ENCOUNTER — Ambulatory Visit (HOSPITAL_COMMUNITY): Payer: No Typology Code available for payment source | Admitting: Anesthesiology

## 2022-01-23 ENCOUNTER — Other Ambulatory Visit: Payer: Self-pay

## 2022-01-23 ENCOUNTER — Encounter (HOSPITAL_COMMUNITY): Payer: Self-pay | Admitting: Cardiovascular Disease

## 2022-01-23 ENCOUNTER — Ambulatory Visit (HOSPITAL_BASED_OUTPATIENT_CLINIC_OR_DEPARTMENT_OTHER): Payer: No Typology Code available for payment source | Admitting: Anesthesiology

## 2022-01-23 ENCOUNTER — Encounter (HOSPITAL_COMMUNITY)
Admission: RE | Disposition: A | Discharge status: Home / Self Care | Payer: Self-pay | Source: Home / Self Care | Attending: Cardiovascular Disease

## 2022-01-23 ENCOUNTER — Ambulatory Visit (HOSPITAL_COMMUNITY)
Admission: RE | Admit: 2022-01-23 | Discharge: 2022-01-23 | Disposition: A | Discharge status: Home / Self Care | Payer: No Typology Code available for payment source | Attending: Cardiovascular Disease | Admitting: Cardiovascular Disease

## 2022-01-23 DIAGNOSIS — E039 Hypothyroidism, unspecified: Secondary | ICD-10-CM | POA: Insufficient documentation

## 2022-01-23 DIAGNOSIS — I491 Atrial premature depolarization: Secondary | ICD-10-CM | POA: Diagnosis not present

## 2022-01-23 DIAGNOSIS — Z79899 Other long term (current) drug therapy: Secondary | ICD-10-CM | POA: Insufficient documentation

## 2022-01-23 DIAGNOSIS — I4891 Unspecified atrial fibrillation: Secondary | ICD-10-CM

## 2022-01-23 DIAGNOSIS — I4819 Other persistent atrial fibrillation: Secondary | ICD-10-CM | POA: Diagnosis not present

## 2022-01-23 DIAGNOSIS — Z7901 Long term (current) use of anticoagulants: Secondary | ICD-10-CM | POA: Diagnosis not present

## 2022-01-23 DIAGNOSIS — I10 Essential (primary) hypertension: Secondary | ICD-10-CM | POA: Diagnosis not present

## 2022-01-23 DIAGNOSIS — Z79890 Hormone replacement therapy (postmenopausal): Secondary | ICD-10-CM | POA: Diagnosis not present

## 2022-01-23 HISTORY — PX: CARDIOVERSION: SHX1299

## 2022-01-23 SURGERY — CARDIOVERSION
Anesthesia: General

## 2022-01-23 MED ORDER — LIDOCAINE HCL (CARDIAC) PF 100 MG/5ML IV SOSY
PREFILLED_SYRINGE | INTRAVENOUS | Status: DC | PRN
  Administered 2022-01-23: 60 mg via INTRAVENOUS

## 2022-01-23 MED ORDER — PROPOFOL 10 MG/ML IV BOLUS
INTRAVENOUS | Status: DC | PRN
  Administered 2022-01-23: 40 mg via INTRAVENOUS
  Administered 2022-01-23: 60 mg via INTRAVENOUS

## 2022-01-23 MED ORDER — EPHEDRINE SULFATE-NACL 50-0.9 MG/10ML-% IV SOSY
PREFILLED_SYRINGE | INTRAVENOUS | Status: DC | PRN
  Administered 2022-01-23: 10 mg via INTRAVENOUS

## 2022-01-23 MED ORDER — LACTATED RINGERS IV SOLN: INTRAVENOUS | Status: DC | PRN

## 2022-01-23 NOTE — Anesthesia Postprocedure Evaluation (Signed)
Anesthesia Post Note  Patient: Angela Dean  Procedure(s) Performed: CARDIOVERSION     Patient location during evaluation: Endoscopy Anesthesia Type: General Level of consciousness: awake and alert Pain management: pain level controlled Vital Signs Assessment: post-procedure vital signs reviewed and stable Respiratory status: spontaneous breathing, nonlabored ventilation and respiratory function stable Cardiovascular status: blood pressure returned to baseline and stable Postop Assessment: no apparent nausea or vomiting Anesthetic complications: no   No notable events documented.  Last Vitals:  Vitals:   01/23/22 1220 01/23/22 1230  BP: (!) 117/102 96/73  Pulse: 67 67  Resp: 19 16  Temp:    SpO2: 94% 95%    Last Pain:  Vitals:   01/23/22 1230  TempSrc:   PainSc: 0-No pain                 Reya Aurich,W. EDMOND

## 2022-01-23 NOTE — Op Note (Signed)
Procedure: Electrical Cardioversion Indications:  Atrial Fibrillation  Procedure Details:  Consent: Risks of procedure as well as the alternatives and risks of each were explained to the (patient/caregiver).  Consent for procedure obtained.  Time Out: Verified patient identification, verified procedure, site/side was marked, verified correct patient position, special equipment/implants available, medications/allergies/relevent history reviewed, required imaging and test results available.  Performed  Patient placed on cardiac monitor, pulse oximetry, supplemental oxygen as necessary.  Sedation given:  propofol 100 mg IV Pacer pads placed anterior and posterior chest.  Cardioverted 1 time(s).  Cardioversion with synchronized biphasic 150J shock.  Evaluation: Findings: Post procedure EKG shows: NSR Complications: None Patient did tolerate procedure well.  Time Spent Directly with the Patient:  30 minutes   Quenesha Douglass 01/23/2022, 12:08 PM

## 2022-01-23 NOTE — Interval H&P Note (Signed)
History and Physical Interval Note:  01/23/2022 12:06 PM  Angela Dean  has presented today for surgery, with the diagnosis of afib.  The various methods of treatment have been discussed with the patient and family. After consideration of risks, benefits and other options for treatment, the patient has consented to  Procedure(s): CARDIOVERSION (N/A) as a surgical intervention.  The patient's history has been reviewed, patient examined, no change in status, stable for surgery.  I have reviewed the patient's chart and labs.  Questions were answered to the patient's satisfaction.     [name]

## 2022-01-23 NOTE — Discharge Instructions (Signed)

## 2022-01-23 NOTE — Anesthesia Preprocedure Evaluation (Addendum)
Anesthesia Evaluation  Patient identified by MRN, date of birth, ID band Patient awake    Reviewed: Allergy & Precautions, H&P , NPO status , Patient's Chart, lab work & pertinent test results  Airway Mallampati: II  TM Distance: >3 FB Neck ROM: Full    Dental no notable dental hx. (+) Teeth Intact, Dental Advisory Given   Pulmonary neg pulmonary ROS,    Pulmonary exam normal breath sounds clear to auscultation       Cardiovascular hypertension, Pt. on medications and Pt. on home beta blockers + dysrhythmias Atrial Fibrillation  Rhythm:Irregular Rate:Tachycardia     Neuro/Psych negative neurological ROS  negative psych ROS   GI/Hepatic negative GI ROS, Neg liver ROS,   Endo/Other  Hypothyroidism   Renal/GU negative Renal ROS  negative genitourinary   Musculoskeletal   Abdominal   Peds  Hematology negative hematology ROS (+)   Anesthesia Other Findings   Reproductive/Obstetrics negative OB ROS                            Anesthesia Physical Anesthesia Plan  ASA: 3  Anesthesia Plan: General   Post-op Pain Management: Minimal or no pain anticipated   Induction: Intravenous  PONV Risk Score and Plan: 3 and Propofol infusion and Treatment may vary due to age or medical condition  Airway Management Planned: Mask  Additional Equipment:   Intra-op Plan:   Post-operative Plan:   Informed Consent: I have reviewed the patients History and Physical, chart, labs and discussed the procedure including the risks, benefits and alternatives for the proposed anesthesia with the patient or authorized representative who has indicated his/her understanding and acceptance.     Dental advisory given  Plan Discussed with: CRNA  Anesthesia Plan Comments:         Anesthesia Quick Evaluation

## 2022-01-23 NOTE — Transfer of Care (Signed)
Immediate Anesthesia Transfer of Care Note  Patient: Angela Dean  Procedure(s) Performed: CARDIOVERSION  Patient Location: Endoscopy Unit  Anesthesia Type:MAC  Level of Consciousness: drowsy  Airway & Oxygen Therapy: Patient Spontanous Breathing  Post-op Assessment: Report given to RN and Post -op Vital signs reviewed and stable  Post vital signs: Reviewed and stable  Last Vitals:  Vitals Value Taken Time  BP    Temp    Pulse    Resp    SpO2      Last Pain:  Vitals:   01/23/22 1142  TempSrc: Oral  PainSc: 0-No pain         Complications: No notable events documented.

## 2022-01-25 ENCOUNTER — Encounter (HOSPITAL_COMMUNITY): Payer: Self-pay | Admitting: Cardiovascular Disease

## 2022-01-31 ENCOUNTER — Encounter: Payer: Self-pay | Admitting: Cardiology

## 2022-01-31 ENCOUNTER — Telehealth: Payer: Self-pay | Admitting: Nurse Practitioner

## 2022-02-01 MED ORDER — ROSUVASTATIN CALCIUM 10 MG PO TABS: 10.0000 mg | ORAL_TABLET | Freq: Every day | ORAL | 1 refills | Status: DC

## 2022-02-01 NOTE — Telephone Encounter (Signed)
Changed lipitor to crestor '10mg'$  daily  Meds ordered this encounter  Medications   rosuvastatin (CRESTOR) 10 MG tablet    Sig: Take 1 tablet (10 mg total) by mouth daily.    Dispense:  90 tablet    Refill:  1    Order Specific Question:   Supervising Provider    Answer:   Worthy Rancher [[id number]]   [place], FNP

## 2022-02-20 NOTE — Telephone Encounter (Signed)
Left detailed message on cell number and home phone number with instructions

## 2022-02-20 NOTE — Telephone Encounter (Signed)
Chart says you are on crestor not lipitor. If on crestor then try taking every other day and see if helps

## 2022-02-20 NOTE — Telephone Encounter (Signed)
Pt says that crestor is not working. Pt says that she is having joint pain. Shoulders hips and knees. Pt says that she is about ready to stop it but wants to tell MMM about the pain first. Please call back on and cell pt goes to work at 12:00pm

## 2022-03-09 ENCOUNTER — Other Ambulatory Visit (HOSPITAL_COMMUNITY): Payer: Self-pay | Admitting: Nurse Practitioner

## 2022-03-09 ENCOUNTER — Other Ambulatory Visit (HOSPITAL_COMMUNITY): Payer: Self-pay | Admitting: Internal Medicine

## 2022-03-09 DIAGNOSIS — I4819 Other persistent atrial fibrillation: Secondary | ICD-10-CM

## 2022-03-09 NOTE — Telephone Encounter (Signed)
Eliquis '5mg'$  refill request received. Patient is 66 years old, weight-70.3kg, Crea-0.85 on 01/12/2022, Diagnosis-Afib, and last seen by Malka So on 01/12/2022. Dose is appropriate based on dosing criteria. Will send in refill to requested pharmacy.

## 2022-03-20 ENCOUNTER — Ambulatory Visit: Payer: No Typology Code available for payment source | Admitting: Nurse Practitioner

## 2022-03-20 ENCOUNTER — Encounter: Payer: Self-pay | Admitting: Nurse Practitioner

## 2022-03-20 VITALS — BP 151/82 | HR 100 | Temp 97.9°F | Ht 64.0 in | Wt 157.1 lb

## 2022-03-20 DIAGNOSIS — E039 Hypothyroidism, unspecified: Secondary | ICD-10-CM

## 2022-03-20 DIAGNOSIS — E7800 Pure hypercholesterolemia, unspecified: Secondary | ICD-10-CM

## 2022-03-20 DIAGNOSIS — I4819 Other persistent atrial fibrillation: Secondary | ICD-10-CM | POA: Diagnosis not present

## 2022-03-20 DIAGNOSIS — I10 Essential (primary) hypertension: Secondary | ICD-10-CM | POA: Diagnosis not present

## 2022-03-20 MED ORDER — DOFETILIDE 250 MCG PO CAPS
250.0000 ug | ORAL_CAPSULE | Freq: Two times a day (BID) | ORAL | 6 refills | Status: DC
Start: 2022-03-20 — End: 2022-06-19

## 2022-03-20 MED ORDER — LEVOTHYROXINE SODIUM 100 MCG PO TABS: ORAL_TABLET | ORAL | 0 refills | Status: DC

## 2022-03-20 MED ORDER — APIXABAN 5 MG PO TABS
5.0000 mg | ORAL_TABLET | Freq: Two times a day (BID) | ORAL | 5 refills | Status: DC
Start: 2022-03-20 — End: 2022-09-20

## 2022-03-20 MED ORDER — ROSUVASTATIN CALCIUM 10 MG PO TABS: 10.0000 mg | ORAL_TABLET | Freq: Every day | ORAL | 1 refills | Status: DC

## 2022-03-20 MED ORDER — LISINOPRIL 10 MG PO TABS: 10.0000 mg | ORAL_TABLET | Freq: Every day | ORAL | 1 refills | Status: DC

## 2022-03-20 MED ORDER — METOPROLOL SUCCINATE ER 50 MG PO TB24: 50.0000 mg | ORAL_TABLET | Freq: Two times a day (BID) | ORAL | 1 refills | Status: DC

## 2022-03-20 NOTE — Patient Instructions (Signed)
Cardiac Ablation, Care After This sheet gives you information about how to care for yourself after your procedure. Your health care provider may also give you more specific instructions. If you have problems or questions, contact your health care provider. What can I expect after the procedure? After the procedure, it is common to have: Bruising around the insertion site. Tenderness around the insertion site. Skipped heartbeats. Tiredness (fatigue). Follow these instructions at home: Insertion site care  Follow instructions from your health care provider about how to take care of your insertion site. Make sure you: Wash your hands with soap and water for at least 20 seconds before and after you change your bandage (dressing). If soap and water are not available, use hand sanitizer. Change your dressing as told by your health care provider. Leave stitches (sutures), skin glue, or adhesive strips in place. These skin closures may need to stay in place for up to 2 weeks. If adhesive strip edges start to loosen and curl up, you may trim the loose edges. Do not remove adhesive strips completely unless your health care provider tells you to do that. Check your insertion site every day for signs of infection. Check for: More redness, swelling, or pain. Fluid or blood. Warmth. Pus or a bad smell. If your insertion site starts to bleed, lie down on your back, apply firm pressure to the area, and contact your health care provider. Driving  If you were given a sedative during the procedure, it can affect you for several hours. Do not drive or operate machinery until your health care provider says that it is safe. Ask your health care provider when it is safe for you to drive again after the procedure. Activity  Avoid activities that take a lot of effort for at least 3 days after your procedure. Do not lift anything that is heavier than 10 lb (4.5 kg), or the limit that you are told, until your  health care provider says that it is safe. Return to your normal activities as told by your health care provider. Ask your health care provider what activities are safe for you. General instructions Take over-the-counter and prescription medicines only as told by your health care provider. Do not use any products that contain nicotine or tobacco, such as cigarettes, e-cigarettes, and chewing tobacco. If you need help quitting, ask your health care provider. Do not take baths, swim, or use a hot tub until your health care provider approves. Ask your health care provider if you may take showers. You may only be allowed to take sponge baths. Do not drink alcohol for 24 hours after your procedure. Keep all follow-up visits as told by your health care provider. This is important. Contact a health care provider if you: Notice these things around the catheter insertion site: More redness, swelling, or pain. Fluid or blood that stops after applying firm pressure to the area. Warmth when you touch the area. Pus or a bad smell. Have a fever. Are sweating a lot. Feel nauseous. Have pain or numbness in the arm or leg closest to your insertion site. Get help right away if: Your insertion site suddenly swells. Your insertion site is bleeding and the bleeding does not stop after applying firm pressure to the area. You have chest pain or discomfort that spreads to your neck, jaw, or arm. You have a fast or irregular heartbeat. You have shortness of breath. You are dizzy or light-headed and feel the need to lie down. These symptoms  may represent a serious problem that is an emergency. Do not wait to see if the symptoms will go away. Get medical help right away. Call your local emergency services (911 in the [place] Do not drive yourself to the hospital. Summary After the procedure, it is common to have bruising and tenderness at the insertion site in your groin or your neck. Check your insertion site every  day for more redness, swelling, or pain. These are signs of infection. Contact a health care provider if you notice any signs of infection. Also, contact a health care provider if you start sweating a lot, feel nauseous, or have pain or numbness in the arm or leg closest to your insertion site. Get help right away if your puncture site is bleeding and the bleeding does not stop after applying firm pressure to the area. This is a medical emergency. This information is not intended to replace advice given to you by your health care provider. Make sure you discuss any questions you have with your health care provider. Document Revised: 09/01/2021 Document Reviewed: 04/20/2019 Elsevier Patient Education  [place].

## 2022-03-20 NOTE — Progress Notes (Signed)
Subjective:    Patient ID: Angela Dean, female    DOB: 1956/01/28, 66 y.o.   MRN: [id number]   Chief Complaint: Medical Management of Chronic Issues, Hyperlipidemia, and Hypothyroidism    HPI:  Angela Dean is a 66 y.o. who identifies as a female who was assigned female at birth.   Social history: Lives with: with her husband Work history: retired   Scientist, forensic in today for follow up of the following chronic medical issues:  1. Primary hypertension No c/o chest pain, sob or headache. Does not check blood pressure at home. BP Readings from Last 3 Encounters:  03/20/22 (!) 151/82  01/23/22 96/73  01/12/22 122/80    2. Pure hypercholesterolemia Does try  to watch diet and does occasional exercise. Lab Results  Component Value Date   CHOL 177 12/19/2021   HDL 63 12/19/2021   LDLCALC 98 12/19/2021   TRIG 87 12/19/2021   CHOLHDL 2.8 12/19/2021     3. Acquired hypothyroidism No problems that she is aware of. Lab Results  Component Value Date   TSH 0.874 12/19/2021     4. Persistent atrial fibrillation (HCC) No palpitations or heart racing   New complaints: None today  No Known Allergies Outpatient Encounter Medications as of 03/20/2022  Medication Sig   acetaminophen (TYLENOL) 500 MG tablet Take 500 mg by mouth every 8 (eight) hours as needed for moderate pain.   dofetilide (TIKOSYN) 250 MCG capsule TAKE ONE CAPSULE BY MOUTH TWICE A DAY   ELIQUIS 5 MG TABS tablet TAKE ONE TABLET TWICE DAILY   levothyroxine (SYNTHROID) 100 MCG tablet TAKE ONE TABLET ONCE DAILY (Patient taking differently: Take 100 mcg by mouth daily before breakfast.)   lisinopril (ZESTRIL) 10 MG tablet Take 1 tablet (10 mg total) by mouth daily.   metoprolol succinate (TOPROL-XL) 50 MG 24 hr tablet Take 1 tablet (50 mg total) by mouth 2 (two) times daily.   rosuvastatin (CRESTOR) 10 MG tablet Take 1 tablet (10 mg total) by mouth daily. (Patient not taking: Reported on 03/20/2022)   No  facility-administered encounter medications on file as of 03/20/2022.    Past Surgical History:  Procedure Laterality Date   CARDIOVERSION N/A 06/20/2021   Procedure: CARDIOVERSION;  Surgeon: Geralynn Rile, MD;  Location: [facility];  Service: Cardiovascular;  Laterality: N/A;   CARDIOVERSION N/A 07/20/2021   Procedure: CARDIOVERSION;  Surgeon: Lelon Perla, MD;  [facility] ENDOSCOPY;  Service: Cardiovascular;  Laterality: N/A;   CARDIOVERSION N/A 01/23/2022   Procedure: CARDIOVERSION;  Surgeon: Sanda Klein, MD;  Location: [facility] ENDOSCOPY;  Service: Cardiovascular;  Laterality: N/A;   MASTECTOMY Left 1997    Family History  Problem Relation Age of Onset   Heart disease Mother    Hypertension Mother    Cancer Sister    Cancer Sister    Cancer Sister       Controlled substance contract: n/a     Review of Systems  Constitutional:  Negative for diaphoresis.  Eyes:  Negative for pain.  Respiratory:  Negative for shortness of breath.   Cardiovascular:  Negative for chest pain, palpitations and leg swelling.  Gastrointestinal:  Negative for abdominal pain.  Endocrine: Negative for polydipsia.  Skin:  Negative for rash.  Neurological:  Negative for dizziness, weakness and headaches.  Hematological:  Does not bruise/bleed easily.  All other systems reviewed and are negative.      Objective:   Physical Exam Vitals and nursing note reviewed.  Constitutional:  General: She is not in acute distress.    Appearance: Normal appearance. She is well-developed.  HENT:     Head: Normocephalic.     Right Ear: Tympanic membrane normal.     Left Ear: Tympanic membrane normal.     Nose: Nose normal.     Mouth/Throat:     Mouth: Mucous membranes are moist.  Eyes:     Pupils: Pupils are equal, round, and reactive to light.  Neck:     Vascular: No carotid bruit or JVD.  Cardiovascular:     Rate and Rhythm: Normal rate. Rhythm irregular.     Heart sounds: Normal  heart sounds.  Pulmonary:     Effort: Pulmonary effort is normal. No respiratory distress.     Breath sounds: Normal breath sounds. No wheezing or rales.  Chest:     Chest wall: No tenderness.  Abdominal:     General: Bowel sounds are normal. There is no distension or abdominal bruit.     Palpations: Abdomen is soft. There is no hepatomegaly, splenomegaly, mass or pulsatile mass.     Tenderness: There is no abdominal tenderness.  Musculoskeletal:        General: Normal range of motion.     Cervical back: Normal range of motion and neck supple.  Lymphadenopathy:     Cervical: No cervical adenopathy.  Skin:    General: Skin is warm and dry.  Neurological:     Mental Status: She is alert and oriented to person, place, and time.     Deep Tendon Reflexes: Reflexes are normal and symmetric.  Psychiatric:        Behavior: Behavior normal.        Thought Content: Thought content normal.        Judgment: Judgment normal.     BP (!) 151/82   Pulse 100   Temp 97.9 F (36.6 C) (Temporal)   Ht '5\' 4"'  (1.626 m)   Wt 157 lb 2 oz (71.3 kg)   SpO2 98%   BMI 26.97 kg/m        Assessment & Plan:   Angela Dean comes in today with chief complaint of Medical Management of Chronic Issues, Hyperlipidemia, and Hypothyroidism   Diagnosis and orders addressed:  1. Primary hypertension Low sodium diet - lisinopril (ZESTRIL) 10 MG tablet; Take 1 tablet (10 mg total) by mouth daily.  Dispense: 90 tablet; Refill: 1 - CBC with Differential/Platelet - CMP14+EGFR  2. Pure hypercholesterolemia Low fat diet - rosuvastatin (CRESTOR) 10 MG tablet; Take 1 tablet (10 mg total) by mouth daily.  Dispense: 90 tablet; Refill: 1 - Lipid panel  3. Acquired hypothyroidism Labs pending - levothyroxine (SYNTHROID) 100 MCG tablet; TAKE ONE TABLET ONCE DAILY  Dispense: 90 tablet; Refill: 0 - Thyroid Panel With TSH  4. Persistent atrial fibrillation (HCC) Avoid caffeine - apixaban (ELIQUIS) 5 MG TABS  tablet; Take 1 tablet (5 mg total) by mouth 2 (two) times daily.  Dispense: 60 tablet; Refill: 5 - metoprolol succinate (TOPROL-XL) 50 MG 24 hr tablet; Take 1 tablet (50 mg total) by mouth 2 (two) times daily.  Dispense: 180 tablet; Refill: 1 - dofetilide (TIKOSYN) 250 MCG capsule; Take 1 capsule (250 mcg total) by mouth 2 (two) times daily.  Dispense: 60 capsule; Refill: 6   Labs pending Health Maintenance reviewed Diet and exercise encouraged  Follow up plan: 6 months   Mary-Margaret Hassell Done, FNP

## 2022-03-21 LAB — CBC WITH DIFFERENTIAL/PLATELET
Basophils Absolute: 0 10*3/uL (ref 0.0–0.2)
Basos: 1 %
EOS (ABSOLUTE): 0.1 10*3/uL (ref 0.0–0.4)
Eos: 1 %
Hematocrit: 42.4 % (ref 34.0–46.6)
Hemoglobin: 14.3 g/dL (ref 11.1–15.9)
Immature Grans (Abs): 0 10*3/uL (ref 0.0–0.1)
Immature Granulocytes: 0 %
Lymphocytes Absolute: 1.8 10*3/uL (ref 0.7–3.1)
Lymphs: 25 %
MCH: 29.5 pg (ref 26.6–33.0)
MCHC: 33.7 g/dL (ref 31.5–35.7)
MCV: 88 fL (ref 79–97)
Monocytes Absolute: 0.5 10*3/uL (ref 0.1–0.9)
Monocytes: 7 %
Neutrophils Absolute: 4.8 10*3/uL (ref 1.4–7.0)
Neutrophils: 66 %
Platelets: 243 10*3/uL (ref 150–450)
RBC: 4.84 x10E6/uL (ref 3.77–5.28)
RDW: 14.6 % (ref 11.7–15.4)
WBC: 7.2 10*3/uL (ref 3.4–10.8)

## 2022-03-21 LAB — THYROID PANEL WITH TSH
Free Thyroxine Index: 3.7 (ref 1.2–4.9)
T3 Uptake Ratio: 32 % (ref 24–39)
T4, Total: 11.5 ug/dL (ref 4.5–12.0)
TSH: 0.582 u[IU]/mL (ref 0.450–4.500)

## 2022-03-21 LAB — CMP14+EGFR
ALT: 15 IU/L (ref 0–32)
AST: 17 IU/L (ref 0–40)
Albumin/Globulin Ratio: 2.1 (ref 1.2–2.2)
Albumin: 4.5 g/dL (ref 3.9–4.9)
Alkaline Phosphatase: 54 IU/L (ref 44–121)
BUN/Creatinine Ratio: 13 (ref 12–28)
BUN: 11 mg/dL (ref 8–27)
Bilirubin Total: 0.6 mg/dL (ref 0.0–1.2)
CO2: 23 mmol/L (ref 20–29)
Calcium: 10 mg/dL (ref 8.7–10.3)
Chloride: 103 mmol/L (ref 96–106)
Creatinine, Ser: 0.83 mg/dL (ref 0.57–1.00)
Globulin, Total: 2.1 g/dL (ref 1.5–4.5)
Glucose: 91 mg/dL (ref 70–99)
Potassium: 4.8 mmol/L (ref 3.5–5.2)
Sodium: 139 mmol/L (ref 134–144)
Total Protein: 6.6 g/dL (ref 6.0–8.5)
eGFR: 78 mL/min/{1.73_m2} (ref >59)

## 2022-03-21 LAB — LIPID PANEL
Chol/HDL Ratio: 2.9 ratio (ref 0.0–4.4)
Cholesterol, Total: 184 mg/dL (ref 100–199)
HDL: 64 mg/dL (ref >39)
LDL Chol Calc (NIH): 106 mg/dL — ABNORMAL HIGH (ref 0–99)
Triglycerides: 78 mg/dL (ref 0–149)
VLDL Cholesterol Cal: 14 mg/dL (ref 5–40)

## 2022-03-22 ENCOUNTER — Ambulatory Visit: Payer: Self-pay | Admitting: Nurse Practitioner

## 2022-03-28 ENCOUNTER — Encounter: Payer: Self-pay | Admitting: Cardiology

## 2022-03-28 ENCOUNTER — Ambulatory Visit: Payer: No Typology Code available for payment source | Attending: Cardiology | Admitting: Cardiology

## 2022-03-28 VITALS — BP 118/70 | HR 101 | Ht 64.5 in | Wt 157.0 lb

## 2022-03-28 DIAGNOSIS — D6869 Other thrombophilia: Secondary | ICD-10-CM

## 2022-03-28 DIAGNOSIS — I4819 Other persistent atrial fibrillation: Secondary | ICD-10-CM | POA: Diagnosis not present

## 2022-03-28 MED ORDER — METOPROLOL TARTRATE 100 MG PO TABS: 100.0000 mg | ORAL_TABLET | Freq: Once | ORAL | 0 refills | Status: DC

## 2022-03-28 NOTE — Progress Notes (Signed)
Electrophysiology Office Note   Date:  03/28/2022   ID:  Angela Dean, DOB Sep 08, 1955, MRN [id number]  PCP:  Chevis Pretty, FNP  Cardiologist:  Phineas Inches Primary Electrophysiologist:  Torry Adamczak Meredith Leeds, MD    Chief Complaint: AF   History of Present Illness: Angela Dean is a 66 y.o. female who is being seen today for the evaluation of AF at the request of Hassell Done, Mary-Margaret, *. Presenting today for electrophysiology evaluation.  He has a history significant for atrial fibrillation, thyroid nodule post thyroidectomy, breast cancer postmastectomy on the left.  She was unfortunately found to be in atrial fibrillation.  She is status post cardioversion 01/23/2022.  January 2023 she was loaded on dofetilide.  Unfortunately she did go back into atrial fibrillation.  Today, she denies symptoms of palpitations, chest pain, shortness of breath, orthopnea, PND, lower extremity edema, claudication, dizziness, presyncope, syncope, bleeding, or neurologic sequela. The patient is tolerating medications without difficulties.  She potentially has some shortness of breath and fatigue.  Despite that, she overall feels well.  She is generally able to do all of her daily activities.  She would like to get back into normal rhythm.   Past Medical History:  Diagnosis Date   Atrial fibrillation ([place])    Breast cancer ([place])    Cancer ([place])    Thyroid disease    Past Surgical History:  Procedure Laterality Date   CARDIOVERSION N/A 06/20/2021   Procedure: CARDIOVERSION;  Surgeon: Geralynn Rile, MD;  Location: [place];  Service: Cardiovascular;  Laterality: N/A;   CARDIOVERSION N/A 07/20/2021   Procedure: CARDIOVERSION;  Surgeon: Lelon Perla, MD;  Location: [place];  Service: Cardiovascular;  Laterality: N/A;   CARDIOVERSION N/A 01/23/2022   Procedure: CARDIOVERSION;  Surgeon: Sanda Klein, MD;  Location: [facility] ENDOSCOPY;  Service: Cardiovascular;  Laterality: N/A;    MASTECTOMY Left 1997     Current Outpatient Medications  Medication Sig Dispense Refill   acetaminophen (TYLENOL) 500 MG tablet Take 500 mg by mouth every 8 (eight) hours as needed for moderate pain.     apixaban (ELIQUIS) 5 MG TABS tablet Take 1 tablet (5 mg total) by mouth 2 (two) times daily. 60 tablet 5   dofetilide (TIKOSYN) 250 MCG capsule Take 1 capsule (250 mcg total) by mouth 2 (two) times daily. 60 capsule 6   levothyroxine (SYNTHROID) 100 MCG tablet TAKE ONE TABLET ONCE DAILY 90 tablet 0   lisinopril (ZESTRIL) 10 MG tablet Take 1 tablet (10 mg total) by mouth daily. 90 tablet 1   metoprolol succinate (TOPROL-XL) 50 MG 24 hr tablet Take 1 tablet (50 mg total) by mouth 2 (two) times daily. 180 tablet 1   rosuvastatin (CRESTOR) 10 MG tablet Take 1 tablet (10 mg total) by mouth daily. (Patient not taking: Reported on 03/28/2022) 90 tablet 1   No current facility-administered medications for this visit.    Allergies:   Patient has no known allergies.   Social History:  The patient  reports that she has never smoked. She has never used smokeless tobacco. She reports that she does not drink alcohol and does not use drugs.   Family History:  The patient's family history includes Cancer in her sister, sister, and sister; Heart disease in her mother; Hypertension in her mother.    ROS:  Please see the history of present illness.   Otherwise, review of systems is positive for none.   All other systems are reviewed and negative.  PHYSICAL EXAM: VS:  BP 118/70   Pulse (!) 101   Ht 5' 4.5" (1.638 m)   Wt 157 lb (71.2 kg)   SpO2 97%   BMI 26.53 kg/m  , BMI Body mass index is 26.53 kg/m. GEN: Well nourished, well developed, in no acute distress  HEENT: normal  Neck: no JVD, carotid bruits, or masses Cardiac: irregular; no murmurs, rubs, or gallops,no edema  Respiratory:  clear to auscultation bilaterally, normal work of breathing GI: soft, nontender, nondistended, + BS MS: no  deformity or atrophy  Skin: warm and dry Neuro:  Strength and sensation are intact Psych: euthymic mood, full affect  EKG:  EKG is ordered today. Personal review of the ekg ordered shows atrial fibrillation, rate 101  Recent Labs: 01/12/2022: Magnesium 2.3 03/20/2022: ALT 15; BUN 11; Creatinine, Ser 0.83; Hemoglobin 14.3; Platelets 243; Potassium 4.8; Sodium 139; TSH 0.582    Lipid Panel     Component Value Date/Time   CHOL 184 03/20/2022 1043   TRIG 78 03/20/2022 1043   TRIG 83 06/03/2014 1104   HDL 64 03/20/2022 1043   HDL 77 06/03/2014 1104   CHOLHDL 2.9 03/20/2022 1043   LDLCALC 106 (H) 03/20/2022 1043   LDLCALC 109 (H) 02/17/2013 1134     Wt Readings from Last 3 Encounters:  03/28/22 157 lb (71.2 kg)  03/20/22 157 lb 2 oz (71.3 kg)  01/23/22 155 lb (70.3 kg)      Other studies Reviewed: Additional studies/ records that were reviewed today include: TTE 05/17/21  Review of the above records today demonstrates:   1. Left ventricular ejection fraction, by estimation, is 50% with beat to  beat variablity in afib. The left ventricle has low normal function. The  left ventricle has no regional wall motion abnormalities. There is mild  left ventricular hypertrophy. Left   ventricular diastolic parameters are indeterminate.   2. Right ventricular systolic function is normal. The right ventricular  size is normal. There is normal pulmonary artery systolic pressure. The  estimated right ventricular systolic pressure is 71.6 mmHg.   3. Left atrial size was severely dilated.   4. Right atrial size was severely dilated.   5. The mitral valve is grossly normal. Mild mitral valve regurgitation.  No evidence of mitral stenosis.   6. The aortic valve is tricuspid. There is mild thickening of the aortic  valve. Aortic valve regurgitation is mild. No aortic stenosis is present.   7. The inferior vena cava is normal in size with greater than 50%  respiratory variability, suggesting  right atrial pressure of 3 mmHg.   Cardiac monitor 12/22/2021 personally reviewed 100% atrial fibrillation burden Less than 1% ventricular ectopy Heart rate average 100 bpm No triggered episodes  ASSESSMENT AND PLAN:  1.  Persistent atrial fibrillation: Currently on dofetilide 250 mcg twice daily, Eliquis 5 mg twice daily.  CHA2DS2-VASc of 2.  High risk medication monitoring for dofetilide.  Unfortunately she has gone back into atrial fibrillation.  She would prefer a rhythm control strategy.  Due to that, we Gabryel Files plan for ablation.  Risk, benefits, and alternatives to EP study and radiofrequency ablation for afib were also discussed in detail today. These risks include but are not limited to stroke, bleeding, vascular damage, tamponade, perforation, damage to the esophagus, lungs, and other structures, pulmonary vein stenosis, worsening renal function, and death. The patient understands these risk and wishes to proceed.  We Waylen Depaolo therefore proceed with catheter ablation at the next available time.  Carto, ICE, anesthesia are requested for the procedure.  Erza Mothershead also obtain CT PV protocol prior to the procedure to exclude LAA thrombus and further evaluate atrial anatomy.   2.  Secondary hypercoagulable state: Currently on Eliquis for atrial fibrillation as above.    Current medicines are reviewed at length with the patient today.   The patient does not have concerns regarding her medicines.  The following changes were made today:  none  Labs/ tests ordered today include:  Orders Placed This Encounter  Procedures   CT CARDIAC MORPH/PULM VEIN W/CM&W/O CA SCORE   Basic metabolic panel   CBC   EKG 12-Lead     Disposition:   FU with Aneita Kiger 3 months  Signed, Richmond Coldren Meredith Leeds, MD  03/28/2022 11:35 AM     [facility] [address] [place] [address] [phone] (office) [phone] (fax)

## 2022-03-28 NOTE — Patient Instructions (Signed)
Medication Instructions:  Your physician recommends that you continue on your current medications as directed. Please refer to the Current Medication list given to you today.  *If you need a refill on your cardiac medications before your next appointment, please call your pharmacy*  Testing/Procedures: Your physician has recommended that you have an ablation. Catheter ablation is a medical procedure used to treat some cardiac arrhythmias (irregular heartbeats). During catheter ablation, a long, thin, flexible tube is put into a blood vessel in your groin (upper thigh), or neck. This tube is called an ablation catheter. It is then guided to your heart through the blood vessel. Radio frequency waves destroy small areas of heart tissue where abnormal heartbeats may cause an arrhythmia to start. Please see the instruction sheet given to you today.   Follow-Up: At [facility], you and your health needs are our priority.  As part of our continuing mission to provide you with exceptional heart care, we have created designated Provider Care Teams.  These Care Teams include your primary Cardiologist (physician) and Advanced Practice Providers (APPs -  Physician Assistants and Nurse Practitioners) who all work together to provide you with the care you need, when you need it.  Your next appointment:   See Instruction Letter

## 2022-03-28 NOTE — Addendum Note (Signed)
Addended by: Antonieta Iba on: 03/28/2022 11:45 AM   Modules accepted: Orders

## 2022-03-30 ENCOUNTER — Telehealth: Payer: Self-pay | Admitting: Nurse Practitioner

## 2022-03-30 NOTE — Telephone Encounter (Signed)
Patient said when she came in on 9/26 she asked about getting something other than rosuvastatin (CRESTOR) 10 MG tablet but it was still called in to the pharmacy. Can something else be called in instead?

## 2022-03-30 NOTE — Telephone Encounter (Signed)
Lmtcb.

## 2022-04-10 MED ORDER — ATORVASTATIN CALCIUM 40 MG PO TABS: 40.0000 mg | ORAL_TABLET | Freq: Every day | ORAL | 1 refills | Status: DC

## 2022-04-10 NOTE — Telephone Encounter (Signed)
Do you remember talking about changing her cholesterol med?

## 2022-04-10 NOTE — Telephone Encounter (Signed)
I changed her from crestor to lipitor- nes prescription has been sent to [facility] ordered this encounter  Medications   atorvastatin (LIPITOR) 40 MG tablet    Sig: Take 1 tablet (40 mg total) by mouth daily.    Dispense:  90 tablet    Refill:  1    Order Specific Question:   Supervising Provider    Answer:   Worthy Rancher [[id number]]   [place], FNP

## 2022-04-10 NOTE — Telephone Encounter (Signed)
Left detailed message on patients voicemail that different med was sent to pharmacy and to contact the office with any further questions

## 2022-05-16 ENCOUNTER — Telehealth: Payer: Self-pay | Admitting: Internal Medicine

## 2022-05-16 NOTE — Telephone Encounter (Signed)
Called patient, advised of message below.   Will send to scheduling team to get scheduled a month after February   Thanks!

## 2022-05-16 NOTE — Telephone Encounter (Signed)
Called to schedule 6 month f/u with Dr. Harl Bowie. Patient states that she is having an ablation in February and wants to know if Dr. Harl Bowie thinks it is best to see her now or wait until after that procedure. Please advise.

## 2022-06-15 ENCOUNTER — Other Ambulatory Visit: Payer: Self-pay | Admitting: Nurse Practitioner

## 2022-06-15 DIAGNOSIS — E039 Hypothyroidism, unspecified: Secondary | ICD-10-CM

## 2022-06-17 ENCOUNTER — Other Ambulatory Visit (HOSPITAL_COMMUNITY): Payer: Self-pay | Admitting: Nurse Practitioner

## 2022-06-17 DIAGNOSIS — I4819 Other persistent atrial fibrillation: Secondary | ICD-10-CM

## 2022-06-26 NOTE — Telephone Encounter (Signed)
  spoke to patient regarding scheduling an appt with Dr Harl Bowie in March as a 1 month follow up to her ablation. Patient will call back to schedule when she can look at her calendar

## 2022-07-19 ENCOUNTER — Ambulatory Visit: Payer: No Typology Code available for payment source | Attending: Cardiology

## 2022-07-19 DIAGNOSIS — I4819 Other persistent atrial fibrillation: Secondary | ICD-10-CM

## 2022-07-19 LAB — CBC
Hematocrit: 42.4 % (ref 34.0–46.6)
Hemoglobin: 14.1 g/dL (ref 11.1–15.9)
MCH: 28.9 pg (ref 26.6–33.0)
MCHC: 33.3 g/dL (ref 31.5–35.7)
MCV: 87 fL (ref 79–97)
Platelets: 227 10*3/uL (ref 150–450)
RBC: 4.88 x10E6/uL (ref 3.77–5.28)
RDW: 14.5 % (ref 11.7–15.4)
WBC: 7 10*3/uL (ref 3.4–10.8)

## 2022-07-19 LAB — BASIC METABOLIC PANEL
BUN/Creatinine Ratio: 21 (ref 12–28)
BUN: 17 mg/dL (ref 8–27)
CO2: 29 mmol/L (ref 20–29)
Calcium: 9.8 mg/dL (ref 8.7–10.3)
Chloride: 103 mmol/L (ref 96–106)
Creatinine, Ser: 0.81 mg/dL (ref 0.57–1.00)
Glucose: 92 mg/dL (ref 70–99)
Potassium: 5.3 mmol/L — ABNORMAL HIGH (ref 3.5–5.2)
Sodium: 140 mmol/L (ref 134–144)
eGFR: 80 mL/min/{1.73_m2} (ref >59)

## 2022-07-20 ENCOUNTER — Telehealth: Payer: Self-pay | Admitting: Internal Medicine

## 2022-07-20 DIAGNOSIS — E875 Hyperkalemia: Secondary | ICD-10-CM

## 2022-07-20 NOTE — Telephone Encounter (Signed)
Patient was returning call. Please advise ?

## 2022-07-20 NOTE — Telephone Encounter (Signed)
Result Care Coordination   Result Notes   Precious Gilding, RN 07/19/2022  4:08 PM EST Back to Top    Left a message to call back Potassium level 5.3.

## 2022-07-20 NOTE — Telephone Encounter (Signed)
Pt scheduled for recheck on 2/7 Pt will watch her potassium intake

## 2022-07-31 ENCOUNTER — Telehealth (HOSPITAL_COMMUNITY): Payer: Self-pay | Admitting: *Deleted

## 2022-07-31 NOTE — Telephone Encounter (Signed)
Reaching out to patient to offer assistance regarding upcoming cardiac imaging study; pt verbalizes understanding of appt date/time, parking situation and where to check in, pre-test NPO status and medications ordered, and verified current allergies; name and call back number provided for further questions should they arise  Angela Santana RN Navigator Cardiac Imaging [facility] [phone] office [phone] cell  Patient to take 100mg metoprolol tartrate two hours prior to her cardiac CT scan. She is aware to arrive at 1pm. 

## 2022-08-01 ENCOUNTER — Ambulatory Visit (HOSPITAL_COMMUNITY)
Admission: RE | Admit: 2022-08-01 | Discharge: 2022-08-01 | Disposition: A | Discharge status: Home / Self Care | Payer: No Typology Code available for payment source | Source: Ambulatory Visit | Attending: Cardiology | Admitting: Cardiology

## 2022-08-01 ENCOUNTER — Ambulatory Visit: Payer: No Typology Code available for payment source

## 2022-08-01 DIAGNOSIS — E875 Hyperkalemia: Secondary | ICD-10-CM | POA: Diagnosis present

## 2022-08-01 DIAGNOSIS — I4819 Other persistent atrial fibrillation: Secondary | ICD-10-CM | POA: Insufficient documentation

## 2022-08-01 MED ORDER — IOHEXOL 350 MG/ML SOLN
100.0000 mL | Freq: Once | INTRAVENOUS | Status: AC | PRN
  Administered 2022-08-01: 100 mL via INTRAVENOUS

## 2022-08-01 MED ORDER — NITROGLYCERIN 0.4 MG SL SUBL: 0.8000 mg | SUBLINGUAL_TABLET | Freq: Once | SUBLINGUAL | Status: DC

## 2022-08-02 ENCOUNTER — Encounter (HOSPITAL_COMMUNITY): Payer: Self-pay | Admitting: *Deleted

## 2022-08-02 ENCOUNTER — Other Ambulatory Visit (HOSPITAL_BASED_OUTPATIENT_CLINIC_OR_DEPARTMENT_OTHER): Payer: No Typology Code available for payment source

## 2022-08-02 LAB — BASIC METABOLIC PANEL
BUN/Creatinine Ratio: 14 (ref 12–28)
BUN: 11 mg/dL (ref 8–27)
CO2: 25 mmol/L (ref 20–29)
Calcium: 9.5 mg/dL (ref 8.7–10.3)
Chloride: 100 mmol/L (ref 96–106)
Creatinine, Ser: 0.81 mg/dL (ref 0.57–1.00)
Glucose: 90 mg/dL (ref 70–99)
Potassium: 4.6 mmol/L (ref 3.5–5.2)
Sodium: 137 mmol/L (ref 134–144)
eGFR: 80 mL/min/{1.73_m2} (ref >59)

## 2022-08-03 ENCOUNTER — Other Ambulatory Visit (HOSPITAL_BASED_OUTPATIENT_CLINIC_OR_DEPARTMENT_OTHER): Payer: No Typology Code available for payment source

## 2022-08-07 NOTE — Pre-Procedure Instructions (Signed)
Instructed patient on the following items: Arrival time 0800 Nothing to eat or drink after midnight No meds AM of procedure Responsible person to drive you home and stay with you for 24 hrs  Have you missed any doses of anti-coagulant Eliquis- hasn't missed any doses, don't take dose on Thursday morning.

## 2022-08-09 ENCOUNTER — Ambulatory Visit (HOSPITAL_BASED_OUTPATIENT_CLINIC_OR_DEPARTMENT_OTHER): Payer: PRIVATE HEALTH INSURANCE | Admitting: Anesthesiology

## 2022-08-09 ENCOUNTER — Encounter (HOSPITAL_COMMUNITY)
Admission: RE | Disposition: A | Discharge status: Home / Self Care | Payer: Self-pay | Source: Home / Self Care | Attending: Cardiology

## 2022-08-09 ENCOUNTER — Ambulatory Visit (HOSPITAL_COMMUNITY)
Admission: RE | Admit: 2022-08-09 | Discharge: 2022-08-09 | Disposition: A | Discharge status: Home / Self Care | Payer: PRIVATE HEALTH INSURANCE | Attending: Cardiology | Admitting: Cardiology

## 2022-08-09 ENCOUNTER — Other Ambulatory Visit: Payer: Self-pay

## 2022-08-09 ENCOUNTER — Ambulatory Visit (HOSPITAL_COMMUNITY): Payer: PRIVATE HEALTH INSURANCE | Admitting: Anesthesiology

## 2022-08-09 DIAGNOSIS — Z853 Personal history of malignant neoplasm of breast: Secondary | ICD-10-CM | POA: Insufficient documentation

## 2022-08-09 DIAGNOSIS — Z9012 Acquired absence of left breast and nipple: Secondary | ICD-10-CM | POA: Diagnosis not present

## 2022-08-09 DIAGNOSIS — E039 Hypothyroidism, unspecified: Secondary | ICD-10-CM | POA: Diagnosis not present

## 2022-08-09 DIAGNOSIS — I4819 Other persistent atrial fibrillation: Secondary | ICD-10-CM | POA: Insufficient documentation

## 2022-08-09 DIAGNOSIS — E890 Postprocedural hypothyroidism: Secondary | ICD-10-CM | POA: Insufficient documentation

## 2022-08-09 DIAGNOSIS — I4891 Unspecified atrial fibrillation: Secondary | ICD-10-CM | POA: Diagnosis not present

## 2022-08-09 DIAGNOSIS — I10 Essential (primary) hypertension: Secondary | ICD-10-CM

## 2022-08-09 HISTORY — PX: ATRIAL FIBRILLATION ABLATION: EP1191

## 2022-08-09 LAB — POCT ACTIVATED CLOTTING TIME
Activated Clotting Time: 358 seconds
Activated Clotting Time: 336 seconds

## 2022-08-09 SURGERY — ATRIAL FIBRILLATION ABLATION
Anesthesia: General

## 2022-08-09 MED ORDER — HEPARIN (PORCINE) IN NACL 1000-0.9 UT/500ML-% IV SOLN
INTRAVENOUS | Status: DC | PRN
  Administered 2022-08-09 (×4): 500 mL

## 2022-08-09 MED ORDER — ONDANSETRON HCL 4 MG/2ML IJ SOLN: 4.0000 mg | Freq: Four times a day (QID) | INTRAMUSCULAR | Status: DC | PRN

## 2022-08-09 MED ORDER — HEPARIN SODIUM (PORCINE) 1000 UNIT/ML IJ SOLN
INTRAMUSCULAR | Status: AC
  Filled 2022-08-09: qty 10

## 2022-08-09 MED ORDER — DOBUTAMINE INFUSION FOR EP/ECHO/NUC (1000 MCG/ML)
INTRAVENOUS | Status: AC
  Filled 2022-08-09: qty 250

## 2022-08-09 MED ORDER — PROTAMINE SULFATE 10 MG/ML IV SOLN
INTRAVENOUS | Status: DC | PRN
  Administered 2022-08-09: 40 mg via INTRAVENOUS

## 2022-08-09 MED ORDER — HEPARIN SODIUM (PORCINE) 1000 UNIT/ML IJ SOLN
INTRAMUSCULAR | Status: DC | PRN
  Administered 2022-08-09: 14000 [IU] via INTRAVENOUS

## 2022-08-09 MED ORDER — SODIUM CHLORIDE 0.9 % IV SOLN: 250.0000 mL | INTRAVENOUS | Status: DC | PRN

## 2022-08-09 MED ORDER — PHENYLEPHRINE HCL-NACL 20-0.9 MG/250ML-% IV SOLN
INTRAVENOUS | Status: DC | PRN
  Administered 2022-08-09: 50 ug/min via INTRAVENOUS

## 2022-08-09 MED ORDER — FENTANYL CITRATE (PF) 250 MCG/5ML IJ SOLN
INTRAMUSCULAR | Status: DC | PRN
  Administered 2022-08-09 (×4): 25 ug via INTRAVENOUS

## 2022-08-09 MED ORDER — ACETAMINOPHEN 325 MG PO TABS: 650.0000 mg | ORAL_TABLET | ORAL | Status: DC | PRN

## 2022-08-09 MED ORDER — SODIUM CHLORIDE 0.9 % IV SOLN: Freq: Once | INTRAVENOUS | Status: AC

## 2022-08-09 MED ORDER — PHENYLEPHRINE 80 MCG/ML (10ML) SYRINGE FOR IV PUSH (FOR BLOOD PRESSURE SUPPORT)
PREFILLED_SYRINGE | INTRAVENOUS | Status: DC | PRN
  Administered 2022-08-09: 80 ug via INTRAVENOUS
  Administered 2022-08-09: 120 ug via INTRAVENOUS

## 2022-08-09 MED ORDER — SODIUM CHLORIDE 0.9 % IV SOLN: INTRAVENOUS | Status: DC

## 2022-08-09 MED ORDER — DOBUTAMINE INFUSION FOR EP/ECHO/NUC (1000 MCG/ML)
INTRAVENOUS | Status: DC | PRN
  Administered 2022-08-09: 20 ug/kg/min via INTRAVENOUS

## 2022-08-09 MED ORDER — SUGAMMADEX SODIUM 200 MG/2ML IV SOLN
INTRAVENOUS | Status: DC | PRN
  Administered 2022-08-09: 132.4 mg via INTRAVENOUS

## 2022-08-09 MED ORDER — PROPOFOL 10 MG/ML IV BOLUS
INTRAVENOUS | Status: DC | PRN
  Administered 2022-08-09 (×2): 100 mg via INTRAVENOUS

## 2022-08-09 MED ORDER — ONDANSETRON HCL 4 MG/2ML IJ SOLN
INTRAMUSCULAR | Status: DC | PRN
  Administered 2022-08-09: 4 mg via INTRAVENOUS

## 2022-08-09 MED ORDER — DEXAMETHASONE SODIUM PHOSPHATE 10 MG/ML IJ SOLN
INTRAMUSCULAR | Status: DC | PRN
  Administered 2022-08-09: 8 mg via INTRAVENOUS

## 2022-08-09 MED ORDER — ROCURONIUM BROMIDE 10 MG/ML (PF) SYRINGE
PREFILLED_SYRINGE | INTRAVENOUS | Status: DC | PRN
  Administered 2022-08-09: 60 mg via INTRAVENOUS

## 2022-08-09 MED ORDER — SODIUM CHLORIDE 0.9% FLUSH: 3.0000 mL | INTRAVENOUS | Status: DC | PRN

## 2022-08-09 MED ORDER — HEPARIN SODIUM (PORCINE) 1000 UNIT/ML IJ SOLN
INTRAMUSCULAR | Status: DC | PRN
  Administered 2022-08-09: 1000 [IU] via INTRAVENOUS

## 2022-08-09 MED ORDER — SODIUM CHLORIDE 0.9% FLUSH: 3.0000 mL | Freq: Two times a day (BID) | INTRAVENOUS | Status: DC

## 2022-08-09 SURGICAL SUPPLY — 22 items
BAG SNAP BAND KOVER 36X36 (MISCELLANEOUS) IMPLANT
CATH 8FR REPROCESSED SOUNDSTAR (CATHETERS) ×1 IMPLANT
CATH 8FR SOUNDSTAR REPROCESSED (CATHETERS) IMPLANT
CATH ABLAT QDOT MICRO BI TC DF (CATHETERS) IMPLANT
CATH OCTARAY 2.0 F 3-3-3-3-3 (CATHETERS) IMPLANT
CATH S-M CIRCA TEMP PROBE (CATHETERS) IMPLANT
CATH WEB BI DIR CSDF CRV REPRO (CATHETERS) IMPLANT
CLOSURE PERCLOSE PROSTYLE (VASCULAR PRODUCTS) IMPLANT
COVER SWIFTLINK CONNECTOR (BAG) ×2 IMPLANT
PACK EP LATEX FREE (CUSTOM PROCEDURE TRAY) ×1
PACK EP LF (CUSTOM PROCEDURE TRAY) ×2 IMPLANT
PAD DEFIB RADIO PHYSIO CONN (PAD) ×2 IMPLANT
PATCH CARTO3 (PAD) IMPLANT
SHEATH BAYLIS SUREFLEX  M 8.5 (SHEATH) ×1
SHEATH BAYLIS SUREFLEX M 8.5 (SHEATH) IMPLANT
SHEATH BAYLIS TRANSSEPTAL 98CM (NEEDLE) IMPLANT
SHEATH CARTO VIZIGO SM CVD (SHEATH) IMPLANT
SHEATH PINNACLE 7F 10CM (SHEATH) IMPLANT
SHEATH PINNACLE 8F 10CM (SHEATH) IMPLANT
SHEATH PINNACLE 9F 10CM (SHEATH) IMPLANT
SHEATH PROBE COVER 6X72 (BAG) IMPLANT
TUBING SMART ABLATE COOLFLOW (TUBING) IMPLANT

## 2022-08-09 NOTE — Anesthesia Procedure Notes (Addendum)
Procedure Name: Intubation Date/Time: 08/09/2022 11:33 AM  Performed by: Maude Leriche, CRNAPre-anesthesia Checklist: Patient identified, Emergency Drugs available, Suction available and Patient being monitored Patient Re-evaluated:Patient Re-evaluated prior to induction Oxygen Delivery Method: Circle system utilized Preoxygenation: Pre-oxygenation with 100% oxygen Induction Type: IV induction Ventilation: Mask ventilation without difficulty Laryngoscope Size: Miller and 2 Grade View: Grade I Tube type: Oral Tube size: 7.0 mm Number of attempts: 1 Airway Equipment and Method: Stylet Placement Confirmation: ETT inserted through vocal cords under direct vision, positive ETCO2 and breath sounds checked- equal and bilateral Secured at: 22 cm Tube secured with: Tape Dental Injury: Teeth and Oropharynx as per pre-operative assessment

## 2022-08-09 NOTE — Progress Notes (Addendum)
Dr Curt Bears in to see client and okay to d/c home

## 2022-08-09 NOTE — Progress Notes (Signed)
Pt and daughter given discharge instructions verbally and in writing.  Both verbalize understanding and deny further questions.  Walked safely in the hallway without bleeding or hematoma.  VSS Pt discharged home with daughter

## 2022-08-09 NOTE — Anesthesia Postprocedure Evaluation (Signed)
Anesthesia Post Note  Patient: Angela Dean  Procedure(s) Performed: ATRIAL FIBRILLATION ABLATION     Patient location during evaluation: PACU Anesthesia Type: General Level of consciousness: awake and alert Pain management: pain level controlled Vital Signs Assessment: post-procedure vital signs reviewed and stable Respiratory status: spontaneous breathing, nonlabored ventilation, respiratory function stable and patient connected to nasal cannula oxygen Cardiovascular status: blood pressure returned to baseline and stable Postop Assessment: no apparent nausea or vomiting Anesthetic complications: no   No notable events documented.  Last Vitals:  Vitals:   08/09/22 0837  BP: 116/84  Temp: 36.7 C  SpO2: 96%    Last Pain:  Vitals:   08/09/22 0837  TempSrc: Oral  PainSc: 0-No pain                 Lakiyah Arntson

## 2022-08-09 NOTE — Anesthesia Preprocedure Evaluation (Signed)
Anesthesia Evaluation  Patient identified by MRN, date of birth, ID band Patient awake    Reviewed: Allergy & Precautions, H&P , NPO status , Patient's Chart, lab work & pertinent test results  Airway Mallampati: II  TM Distance: >3 FB Neck ROM: Full    Dental no notable dental hx. (+) Dental Advisory Given, Poor Dentition, Loose,    Pulmonary neg pulmonary ROS   Pulmonary exam normal breath sounds clear to auscultation       Cardiovascular hypertension, Pt. on medications and Pt. on home beta blockers + dysrhythmias Atrial Fibrillation  Rhythm:Irregular Rate:Tachycardia     Neuro/Psych negative neurological ROS  negative psych ROS   GI/Hepatic negative GI ROS, Neg liver ROS,,,  Endo/Other  Hypothyroidism    Renal/GU negative Renal ROS  negative genitourinary   Musculoskeletal   Abdominal   Peds  Hematology negative hematology ROS (+)   Anesthesia Other Findings   Reproductive/Obstetrics negative OB ROS                             Anesthesia Physical Anesthesia Plan  ASA: 3  Anesthesia Plan: General   Post-op Pain Management: Minimal or no pain anticipated   Induction: Intravenous  PONV Risk Score and Plan: 3 and Treatment may vary due to age or medical condition, Ondansetron and Dexamethasone  Airway Management Planned: Oral ETT  Additional Equipment: None  Intra-op Plan:   Post-operative Plan: Extubation in OR  Informed Consent: I have reviewed the patients History and Physical, chart, labs and discussed the procedure including the risks, benefits and alternatives for the proposed anesthesia with the patient or authorized representative who has indicated his/her understanding and acceptance.     Dental advisory given  Plan Discussed with: CRNA and Anesthesiologist  Anesthesia Plan Comments: (  )        Anesthesia Quick Evaluation

## 2022-08-09 NOTE — Progress Notes (Signed)
Report given to Lisa RN

## 2022-08-09 NOTE — H&P (Signed)
Electrophysiology Office Note   Date:  08/09/2022   ID:  Angela Dean, DOB [birth year], MRN [id number]  PCP:  Chevis Pretty, FNP  Cardiologist:  Phineas Inches Primary Electrophysiologist:  Alieyah Spader Meredith Leeds, MD    Chief Complaint: AF   History of Present Illness: Angela Dean is a 67 y.o. female who is being seen today for the evaluation of AF at the request of No ref. provider found. Presenting today for electrophysiology evaluation.  He has a history significant for atrial fibrillation, thyroid nodule post thyroidectomy, breast cancer postmastectomy on the left.  She was unfortunately found to be in atrial fibrillation.  She is status post cardioversion 01/23/2022.  January 2023 she was loaded on dofetilide.  Unfortunately she did go back into atrial fibrillation.  Today, denies symptoms of palpitations, chest pain, shortness of breath, orthopnea, PND, lower extremity edema, claudication, dizziness, presyncope, syncope, bleeding, or neurologic sequela. The patient is tolerating medications without difficulties. Plan ablation today.    Past Medical History:  Diagnosis Date   Atrial fibrillation ([place])    Breast cancer ([place])    Cancer ([place])    Thyroid disease    Past Surgical History:  Procedure Laterality Date   CARDIOVERSION N/A 06/20/2021   Procedure: CARDIOVERSION;  Surgeon: Geralynn Rile, MD;  Location: [facility];  Service: Cardiovascular;  Laterality: N/A;   CARDIOVERSION N/A 07/20/2021   Procedure: CARDIOVERSION;  Surgeon: Lelon Perla, MD;  Location: [facility] ENDOSCOPY;  Service: Cardiovascular;  Laterality: N/A;   CARDIOVERSION N/A 01/23/2022   Procedure: CARDIOVERSION;  Surgeon: Sanda Klein, MD;  Location: [facility] ENDOSCOPY;  Service: Cardiovascular;  Laterality: N/A;   MASTECTOMY Left 1997     Current Facility-Administered Medications  Medication Dose Route Frequency Provider Last Rate Last Admin   0.9 %  sodium chloride infusion   Intravenous  Continuous Constance Haw, MD 50 mL/hr at 08/09/22 1004 Continued from Pre-op at 08/09/22 1004    Allergies:   Patient has no known allergies.   Social History:  The patient  reports that she has never smoked. She has never used smokeless tobacco. She reports that she does not drink alcohol and does not use drugs.   Family History:  The patient's family history includes Cancer in her sister, sister, and sister; Heart disease in her mother; Hypertension in her mother.   ROS:  Please see the history of present illness.   Otherwise, review of systems is positive for none.   All other systems are reviewed and negative.   PHYSICAL EXAM: VS:  BP 116/84 (BP Location: Right Arm)   Temp 98 F (36.7 C) (Oral)   Ht 5' 5"$  (1.651 m)   Wt 66.2 kg   SpO2 96%   BMI 24.30 kg/m  , BMI Body mass index is 24.3 kg/m. GEN: Well nourished, well developed, in no acute distress  HEENT: normal  Neck: no JVD, carotid bruits, or masses Cardiac: RRR; no murmurs, rubs, or gallops,no edema  Respiratory:  clear to auscultation bilaterally, normal work of breathing GI: soft, nontender, nondistended, + BS MS: no deformity or atrophy  Skin: warm and dry Neuro:  Strength and sensation are intact Psych: euthymic mood, full affect  Recent Labs: 01/12/2022: Magnesium 2.3 03/20/2022: ALT 15; TSH 0.582 07/19/2022: Hemoglobin 14.1; Platelets 227 08/01/2022: BUN 11; Creatinine, Ser 0.81; Potassium 4.6; Sodium 137    Lipid Panel     Component Value Date/Time   CHOL 184 03/20/2022 1043   TRIG 78 03/20/2022 1043  TRIG 83 06/03/2014 1104   HDL 64 03/20/2022 1043   HDL 77 06/03/2014 1104   CHOLHDL 2.9 03/20/2022 1043   LDLCALC 106 (H) 03/20/2022 1043   LDLCALC 109 (H) 02/17/2013 1134     Wt Readings from Last 3 Encounters:  08/09/22 66.2 kg  03/28/22 71.2 kg  03/20/22 71.3 kg      Other studies Reviewed: Additional studies/ records that were reviewed today include: TTE 05/17/21  Review of the above  records today demonstrates:   1. Left ventricular ejection fraction, by estimation, is 50% with beat to  beat variablity in afib. The left ventricle has low normal function. The  left ventricle has no regional wall motion abnormalities. There is mild  left ventricular hypertrophy. Left   ventricular diastolic parameters are indeterminate.   2. Right ventricular systolic function is normal. The right ventricular  size is normal. There is normal pulmonary artery systolic pressure. The  estimated right ventricular systolic pressure is 0000000 mmHg.   3. Left atrial size was severely dilated.   4. Right atrial size was severely dilated.   5. The mitral valve is grossly normal. Mild mitral valve regurgitation.  No evidence of mitral stenosis.   6. The aortic valve is tricuspid. There is mild thickening of the aortic  valve. Aortic valve regurgitation is mild. No aortic stenosis is present.   7. The inferior vena cava is normal in size with greater than 50%  respiratory variability, suggesting right atrial pressure of 3 mmHg.   Cardiac monitor 12/22/2021 personally reviewed 100% atrial fibrillation burden Less than 1% ventricular ectopy Heart rate average 100 bpm No triggered episodes  ASSESSMENT AND PLAN:  1.  Persistent atrial fibrillation: SUKHMAN OKRAY has presented today for surgery, with the diagnosis of AF.  The various methods of treatment have been discussed with the patient and family. After consideration of risks, benefits and other options for treatment, the patient has consented to  Procedure(s): Catheter ablation as a surgical intervention .  Risks include but not limited to complete heart block, stroke, esophageal damage, nerve damage, bleeding, vascular damage, tamponade, perforation, MI, and death. The patient's history has been reviewed, patient examined, no change in status, stable for surgery.  I have reviewed the patient's chart and labs.  Questions were answered to the patient's  satisfaction.    Lyndsee Casa Curt Bears, MD 08/09/2022 10:38 AM

## 2022-08-09 NOTE — Progress Notes (Signed)
Called Lab 4 to inform Dr. Curt Bears about previous note at 54 (spoke with LB, RN who relayed message), ok to use RLE pressure, confirmed no eliquis for today, safety maintained

## 2022-08-09 NOTE — Transfer of Care (Signed)
Immediate Anesthesia Transfer of Care Note  Patient: Parisa Mcgown Dawson  Procedure(s) Performed: ATRIAL FIBRILLATION ABLATION  Patient Location: PACU and Cath Lab  Anesthesia Type:General  Level of Consciousness: patient cooperative and responds to stimulation  Airway & Oxygen Therapy: Patient Spontanous Breathing and Patient connected to nasal cannula oxygen  Post-op Assessment: Report given to RN, Post -op Vital signs reviewed and stable, and Patient moving all extremities X 4  Post vital signs: Reviewed and stable  Last Vitals:  Vitals Value Taken Time  BP 94/48 08/09/22 1336  Temp    Pulse 89 08/09/22 1336  Resp 14 08/09/22 1336  SpO2 92 % 08/09/22 1336  Vitals shown include unvalidated device data.  Last Pain:  Vitals:   08/09/22 0837  TempSrc: Oral  PainSc: 0-No pain         Complications: No notable events documented.

## 2022-08-09 NOTE — Progress Notes (Signed)
Dr. Curt Bears notified that pt bp decreased, see flowsheets, order obtained

## 2022-08-09 NOTE — Progress Notes (Signed)
Automatic cuff changed x3 to RUE over pt time in holding area, attempted manual cuff pressure to RUE x RN x2, with low readings and barely audible readings (in the 60's and 70's SBP), automatic cuff moved to rle and BP increased, see flowsheets, LUE restricted, pt remains oriented with periods of resting with eyes closed off and on, safety maintained

## 2022-08-10 ENCOUNTER — Encounter (HOSPITAL_COMMUNITY): Payer: Self-pay | Admitting: Cardiology

## 2022-09-06 ENCOUNTER — Encounter (HOSPITAL_COMMUNITY): Payer: Self-pay | Admitting: Physician Assistant

## 2022-09-06 ENCOUNTER — Ambulatory Visit (HOSPITAL_COMMUNITY)
Admission: RE | Admit: 2022-09-06 | Discharge: 2022-09-06 | Disposition: A | Discharge status: Home / Self Care | Payer: No Typology Code available for payment source | Source: Ambulatory Visit | Attending: Physician Assistant | Admitting: Physician Assistant

## 2022-09-06 VITALS — BP 104/80 | HR 71 | Ht 65.0 in | Wt 156.2 lb

## 2022-09-06 DIAGNOSIS — D6869 Other thrombophilia: Secondary | ICD-10-CM | POA: Diagnosis not present

## 2022-09-06 DIAGNOSIS — I4819 Other persistent atrial fibrillation: Secondary | ICD-10-CM | POA: Insufficient documentation

## 2022-09-06 DIAGNOSIS — I10 Essential (primary) hypertension: Secondary | ICD-10-CM | POA: Insufficient documentation

## 2022-09-06 DIAGNOSIS — Z7982 Long term (current) use of aspirin: Secondary | ICD-10-CM | POA: Insufficient documentation

## 2022-09-06 DIAGNOSIS — Z5181 Encounter for therapeutic drug level monitoring: Secondary | ICD-10-CM

## 2022-09-06 DIAGNOSIS — Z79899 Other long term (current) drug therapy: Secondary | ICD-10-CM | POA: Diagnosis not present

## 2022-09-06 DIAGNOSIS — Z7901 Long term (current) use of anticoagulants: Secondary | ICD-10-CM | POA: Diagnosis not present

## 2022-09-06 LAB — MAGNESIUM: Magnesium: 2 mg/dL (ref 1.7–2.4)

## 2022-09-06 NOTE — Progress Notes (Signed)
Primary Care Physician: Chevis Pretty, FNP Referring Physician:Dr. Phineas Inches Primary EP: Dr Orland Mustard is a 67 y.o. female  with a hx of atrial fibrillation CHADS2VASC 2 on aspirin, Thyrod nodule s/p thyroidectomy, breast cancer mastectomy on the L side, no radiation, no chemo   She is in the afib clinic for evaluation after being seen by Dr. Harl Bowie 10/31 for afib. She has RVR today at 115 bpm. She is asymptomatic. It appears that she is persistent. I discussed with pt to pursue restoring SR, it will likely mean pursing cardioversion and for this she will need to be on anticoagulation for at least 21 days prior to this. She does not drink alcohol, smoke, or snore.   F/u in afib clinic, 06/02/21. She remains in rate controlled afib. We discussed risk vrs benefit of pursing cardioversion and she would like to pursue. She did have a doppler for decreased blood flow to rt arm and by doppler was found to have an obstruction. She was directed to the ER for CT and the report read  rt subclavian may be severely stenotic or occluded( age indeterminate) but pt states her BP has not been audible in her rt arm for some time. She had her BB increased in the ER for elevated BP and was told to continue her eliquis.   We discussed pursing CV, risk vrs benefit  and she is in agreement. No missed anticoagulation since starting drug and the cardioversion are running almost 3 weeks out. Recent echo showed low normal EF with bilateral atria enlargement.  Follow up in the AF clinic 06/28/21. Patient is s/p DCCV on 06/20/21 which was unsuccessful after 3 attempts. Per procedure report, no sinus beats after any of the attempts. She is in rate controlled afib today. Remains asymptomatic.   Follow up in the AF clinic 07/18/21. Patient presents for dofetilide admission. She denies any missed doses of anticoagulation.   F/u 2/223. She is now one week out from Tikosyn loading and is in SR. She has had some  back pain since hospital stay, she feels from sitting and lying in bed so much. She is taking Tylenol for it and it is more positional in nature.   F/u in afib clinic, 12/07/21. She is in afib today, rate controlled. She is unaware of when she is in rhythm or in afib. She continues on Tikosyn 250 mcg bid.  BP is elevated today. Rechecked 160/104. Pt states under much stress taking care of her mother. BP usually does not run that high. BP has to be taken on left arm as she has a rt subclavian artery stenosis  Follow up in the AF clinic 01/12/22. Patient remains in persistent afib today. Zio patch showed 100% afib burden. She is completely unaware of her arrhythmia and feels "better than I have in years."   Follow up in the AF clinic 09/06/22. Patient had DCCV on 01/23/22 but was back in afib at her follow up with Dr Curt Bears on 03/28/22. She underwent afib ablation with Dr Curt Bears on 08/09/22. Patient is in [place] today. She does feel that her fatigue has improved. She denies chest pain, swallowing pain, or groin issues.    Today, she denies symptoms of palpitations, chest pain, shortness of breath, orthopnea, PND, lower extremity edema, dizziness, presyncope, syncope, or neurologic sequela. The patient is tolerating medications without difficulties and is otherwise without complaint today.   Past Medical History:  Diagnosis Date   Atrial fibrillation ([place])  Breast cancer ([place])    Cancer ([place])    Thyroid disease    Past Surgical History:  Procedure Laterality Date   ATRIAL FIBRILLATION ABLATION N/A 08/09/2022   Procedure: ATRIAL FIBRILLATION ABLATION;  Surgeon: Constance Haw, MD;  Location: [facility] CV LAB;  Service: Cardiovascular;  Laterality: N/A;   CARDIOVERSION N/A 06/20/2021   Procedure: CARDIOVERSION;  Surgeon: Geralynn Rile, MD;  Location: [facility];  Service: Cardiovascular;  Laterality: N/A;   CARDIOVERSION N/A 07/20/2021   Procedure: CARDIOVERSION;  Surgeon: Lelon Perla, MD;  Location: [facility] ENDOSCOPY;  Service: Cardiovascular;  Laterality: N/A;   CARDIOVERSION N/A 01/23/2022   Procedure: CARDIOVERSION;  Surgeon: Sanda Klein, MD;  Location: [facility] ENDOSCOPY;  Service: Cardiovascular;  Laterality: N/A;   MASTECTOMY Left 1997    Current Outpatient Medications  Medication Sig Dispense Refill   apixaban (ELIQUIS) 5 MG TABS tablet Take 1 tablet (5 mg total) by mouth 2 (two) times daily. 60 tablet 5   dofetilide (TIKOSYN) 250 MCG capsule TAKE ONE CAPSULE BY MOUTH TWICE A DAY 60 capsule 10   levothyroxine (SYNTHROID) 100 MCG tablet TAKE ONE TABLET ONCE DAILY 90 tablet 2   lisinopril (ZESTRIL) 10 MG tablet Take 1 tablet (10 mg total) by mouth daily. 90 tablet 1   metoprolol succinate (TOPROL-XL) 50 MG 24 hr tablet Take 1 tablet (50 mg total) by mouth 2 (two) times daily. 180 tablet 1   No current facility-administered medications for this encounter.    No Known Allergies  Social History   Socioeconomic History   Marital status: Divorced    Spouse name: Not on file   Number of children: Not on file   Years of education: Not on file   Highest education level: Not on file  Occupational History   Not on file  Tobacco Use   Smoking status: Never   Smokeless tobacco: Never   Tobacco comments:    Never smoke 01/12/22  Substance and Sexual Activity   Alcohol use: No   Drug use: No   Sexual activity: Not on file  Other Topics Concern   Not on file  Social History Narrative   Not on file   Social Determinants of Health   Financial Resource Strain: Not on file  Food Insecurity: Not on file  Transportation Needs: Not on file  Physical Activity: Not on file  Stress: Not on file  Social Connections: Not on file  Intimate Partner Violence: Not on file    Family History  Problem Relation Age of Onset   Heart disease Mother    Hypertension Mother    Cancer Sister    Cancer Sister    Cancer Sister     ROS- All systems are reviewed and negative  except as per the HPI above  Physical Exam: Vitals:   09/06/22 1119  BP: 104/80  Pulse: 71  Weight: 70.9 kg  Height: '5\' 5"'$  (1.651 m)     Wt Readings from Last 3 Encounters:  09/06/22 70.9 kg  08/09/22 66.2 kg  03/28/22 71.2 kg    Labs: Lab Results  Component Value Date   NA 137 08/01/2022   K 4.6 08/01/2022   CL 100 08/01/2022   CO2 25 08/01/2022   GLUCOSE 90 08/01/2022   BUN 11 08/01/2022   CREATININE 0.81 08/01/2022   CALCIUM 9.5 08/01/2022   MG 2.3 01/12/2022   Lab Results  Component Value Date   INR 1.0 07/20/2021   Lab Results  Component Value  Date   CHOL 184 03/20/2022   HDL 64 03/20/2022   LDLCALC 106 (H) 03/20/2022   TRIG 78 03/20/2022    GEN- The patient is a well appearing female, alert and oriented x 3 today.   HEENT-head normocephalic, atraumatic, sclera clear, conjunctiva pink, hearing intact, trachea midline. Lungs- Clear to ausculation bilaterally, normal work of breathing Heart- Regular rate and rhythm, no murmurs, rubs or gallops  GI- soft, NT, ND, + BS Extremities- no clubbing, cyanosis, or edema MS- no significant deformity or atrophy Skin- no rash or lesion Psych- euthymic mood, full affect Neuro- strength and sensation are intact   ECG today demonstrates SR Vent. rate 71 BPM PR interval 154 ms QRS duration 76 ms QT/QTcB 426/462 ms   Echo-  05/16/2021 1. Left ventricular ejection fraction, by estimation, is 50% with beat to beat variablity in afib. The left ventricle has low normal function. The left ventricle has no regional wall motion abnormalities. There is mild left ventricular hypertrophy. Left  ventricular diastolic parameters are indeterminate.   2. Right ventricular systolic function is normal. The right ventricular size is normal. There is normal pulmonary artery systolic pressure. The estimated right ventricular systolic pressure is 0000000 mmHg.   3. Left atrial size was severely dilated.   4. Right atrial size was  severely dilated.   5. The mitral valve is grossly normal. Mild mitral valve regurgitation. No evidence of mitral stenosis.   6. The aortic valve is tricuspid. There is mild thickening of the aortic valve. Aortic valve regurgitation is mild. No aortic stenosis is present.   7. The inferior vena cava is normal in size with greater than 50% respiratory variability, suggesting right atrial pressure of 3 mmHg.    CHA2DS2-VASc Score = 3  The patient's score is based upon: CHF History: 0 HTN History: 1 Diabetes History: 0 Stroke History: 0 Vascular Disease History: 0 Age Score: 1 Gender Score: 1       ASSESSMENT AND PLAN: 1. Persistent Atrial Fibrillation (ICD10:  I48.19) The patient's CHA2DS2-VASc score is 3, indicating a 3.2% annual risk of stroke.   S/p dofetilide admission 06/2021 S/p afib ablation 08/09/22 Patient appears to be maintaining SR.  Continue dofetilide 250 mcg BID. QT stable.  Check magnesium today.  Continue Eliquis 5 mg BID with no missed doses for 3 months post ablation.  Continue metoprolol succinate 50 mg BID  2. Secondary Hypercoagulable State (ICD10:  D68.69) The patient is at significant risk for stroke/thromboembolism based upon her CHA2DS2-VASc Score of 3.  Continue Apixaban (Eliquis).   3. HTN Stable, no changes today.   Follow up with Dr Curt Bears as scheduled.     [facility] [address] [place], [place] [address] [phone]

## 2022-09-07 ENCOUNTER — Other Ambulatory Visit: Payer: Self-pay | Admitting: Nurse Practitioner

## 2022-09-07 DIAGNOSIS — Z1231 Encounter for screening mammogram for malignant neoplasm of breast: Secondary | ICD-10-CM

## 2022-09-10 ENCOUNTER — Ambulatory Visit: Payer: No Typology Code available for payment source | Attending: Internal Medicine | Admitting: Internal Medicine

## 2022-09-10 ENCOUNTER — Encounter: Payer: Self-pay | Admitting: Internal Medicine

## 2022-09-10 VITALS — HR 72 | Ht 64.5 in | Wt 155.0 lb

## 2022-09-10 DIAGNOSIS — E785 Hyperlipidemia, unspecified: Secondary | ICD-10-CM

## 2022-09-10 MED ORDER — EZETIMIBE 10 MG PO TABS: 10.0000 mg | ORAL_TABLET | Freq: Every day | ORAL | 3 refills | Status: DC

## 2022-09-10 NOTE — Patient Instructions (Addendum)
Medication Instructions:   START Zetia 10 mg daily  *If you need a refill on your cardiac medications before your next appointment, please call your pharmacy*  Lab Work: Your physician recommends that you return for lab work in 3 months-12/11/22:  Fasting Lipid Panel-DO NOT eat or drink past midnight. Okay to have water and/or black coffee only the morning of blood work If you have labs (blood work) drawn today and your tests are completely normal, you will receive your results only by: [place] (if you have MyChart) OR A paper copy in the mail If you have any lab test that is abnormal or we need to change your treatment, we will call you to review the results.  Testing/Procedures: NONE ordered at this time of appointment   Follow-Up: At [facility], you and your health needs are our priority.  As part of our continuing mission to provide you with exceptional heart care, we have created designated Provider Care Teams.  These Care Teams include your primary Cardiologist (physician) and Advanced Practice Providers (APPs -  Physician Assistants and Nurse Practitioners) who all work together to provide you with the care you need, when you need it.     Your next appointment:   1 year(s)  Provider:   Janina Mayo, MD     Other Instructions

## 2022-09-10 NOTE — Progress Notes (Signed)
Cardiology Office Note:    Date:  09/10/2022   ID:  Angela Dean, DOB 09-28-1955, MRN [id number]  PCP:  Angela Dean, [place] Providers Cardiologist:  Angela Mayo, MD     Referring MD: Angela Dean, *   Chief Complaint  Patient presents with   Follow-up    6 months.   New diagnosis atrial fibrillation  History of Present Illness:    Angela Dean is a 67 y.o. female with a hx of atrial fibrillation CHADS2VASC 2 on aspirin, Thyrod nodule s/p thyroidectomy, breast cancer mastectomy on the L side, no radiation, no chemo  She notes diagnosis of atrial fibrillation. When resting after taking care of her mother , she feels palpitations. With activity she has no symptoms. She denies shortness of breath. She denies fatigue but was more fatigued  in June. She stopped taking aspirin thinking it was affecting. No bleeding. No syncope. She denies angina, dyspnea on exertion, lower extremity edema, PND or orthopnea. No smoking. Mother has atrial fibrillation. Father deceased in 2010-10-17, no heart disease.No siblings with heart disease. Children no heart disease. She was diagnosed with thyroid dx 30 years ago with nodules. S/p removal. She is on synthroid.    11/29/2020 TSH 0.5 Crt 0.9 LDL 112, HDL 76, TC 201  Normal LFTs  ECG 08/23/2020- atrial fibrillation   Interim Hx: Angela Dean was seen in afib clinic 05/26/2021 and she was noted to have diminished radial pulses. She stated this was long standing. No evidence of limb ischemia  She had BL arterial duplex of the upper extremities. She had > 50% stenosis in the RUE with subclavian artery obstruction, c/f possible thrombus. Flow was noted to be monophasic. Left brachial artery with noted obstruction  Plan was for CTA chest but underwent CT PE study at the ED in [place]. She was noted to have ascending aoritc aneurysm 4.0 cm. ).Minimal aortic arch calcifications. Multiple collateral vessels along the right upper  chest.  CT neck showed :  Few areas of right greater than left subclavian artery narrowing measuring up to 60%. Anomalous ligamentous bands are possible, but there is also a mildly beaded appearance of the cervical internal carotids suggesting an underlying vasculopathy (for example, fibromuscular dysplasia). Additionally, linear filling defect in the distal cervical ICA could reflect a superimposed dissection.  Called radiology, felt if dissection it was stable.   Echo notable for normal LV function. Mild LVH. Mild AI and MR. Noted to have bi-atrail enlargement. E/e' < 14. Normal RA pressure. No significant pulmonary htn  She continues to follow with afib clinic and was admitted for a tikosyn load. She had DCCV 07/20/2021. She converted to sinus bradycardia. Her Qtc was stable. She is maintained on Tikosyn  She comes in today for follow up. She denies palpitations. No LH, dizziness or fainting. She is asymptomatic.   Interim Hx 09/10/2022 She saw Angela Dean, her sublclavian stenosis is stable as well as  right ICA dissection. She was started on lipitor and stopped 2/2 overall muscle pain  She's been followed in afib clinic. She had a ziopatch demonstrating 100% burden of afib. She is asymptomatic. She is s/p DCCV on 01/23/2022 with restoration of sinus rhythm with 1 shock at 150J. She saw Dr. Curt Dean and plan is for an ablation. She is s/p PVI 08/09/2022. Visit on 09/06/2022 with afib clinic, she was in sinus rhythm. No changes in he medications. Denies CP or SOB. She takes of her mother. Blood pressure  is was controlled on 3/14   Past Medical History:  Diagnosis Date   Atrial fibrillation ([place])    Breast cancer ([place])    Cancer ([place])    Thyroid disease     Past Surgical History:  Procedure Laterality Date   ATRIAL FIBRILLATION ABLATION N/A 08/09/2022   Procedure: ATRIAL FIBRILLATION ABLATION;  Surgeon: Angela Haw, MD;  Location: [facility];  Service: Cardiovascular;   Laterality: N/A;   CARDIOVERSION N/A 06/20/2021   Procedure: CARDIOVERSION;  Surgeon: Angela Rile, MD;  Location: [place];  Service: Cardiovascular;  Laterality: N/A;   CARDIOVERSION N/A 07/20/2021   Procedure: CARDIOVERSION;  Surgeon: Angela Perla, MD;  Location: [facility] ENDOSCOPY;  Service: Cardiovascular;  Laterality: N/A;   CARDIOVERSION N/A 01/23/2022   Procedure: CARDIOVERSION;  Surgeon: Angela Klein, MD;  Location: [facility]OSCOPY;  Service: Cardiovascular;  Laterality: N/A;   MASTECTOMY Left 1997    Current Medications: Current Meds  Medication Sig   apixaban (ELIQUIS) 5 MG TABS tablet Take 1 tablet (5 mg total) by mouth 2 (two) times daily.   dofetilide (TIKOSYN) 250 MCG capsule TAKE ONE CAPSULE BY MOUTH TWICE A DAY   levothyroxine (SYNTHROID) 100 MCG tablet TAKE ONE TABLET ONCE DAILY   lisinopril (ZESTRIL) 10 MG tablet Take 1 tablet (10 mg total) by mouth daily.   metoprolol succinate (TOPROL-XL) 50 MG 24 hr tablet Take 1 tablet (50 mg total) by mouth 2 (two) times daily.     Allergies:   Patient has no known allergies.   Social History   Socioeconomic History   Marital status: Divorced    Spouse name: Not on file   Number of children: Not on file   Years of education: Not on file   Highest education level: Not on file  Occupational History   Not on file  Tobacco Use   Smoking status: Never   Smokeless tobacco: Never   Tobacco comments:    Never smoke 01/12/22  Substance and Sexual Activity   Alcohol use: No   Drug use: No   Sexual activity: Not on file  Other Topics Concern   Not on file  Social History Narrative   Not on file   Social Determinants of Health   Financial Resource Strain: Not on file  Food Insecurity: Not on file  Transportation Needs: Not on file  Physical Activity: Not on file  Stress: Not on file  Social Connections: Not on file     Family History: The patient's family history includes Cancer in her sister, sister, and  sister; Heart disease in her mother; Hypertension in her mother.  ROS:   Please see the history of present illness.     All other systems reviewed and are negative.  EKGs/Labs/Other Studies Reviewed:    The following studies were reviewed today:   EKG:  EKG is  ordered today.  The ekg ordered today demonstrates   Atrial Fibrillation rate 113. Qtc 471ms   Recent Labs: 03/20/2022: ALT 15; TSH 0.582 07/19/2022: Hemoglobin 14.1; Platelets 227 08/01/2022: BUN 11; Creatinine, Ser 0.81; Potassium 4.6; Sodium 137 09/06/2022: Magnesium 2.0  Recent Lipid Panel    Component Value Date/Time   CHOL 184 03/20/2022 1043   TRIG 78 03/20/2022 1043   TRIG 83 06/03/2014 1104   HDL 64 03/20/2022 1043   HDL 77 06/03/2014 1104   CHOLHDL 2.9 03/20/2022 1043   LDLCALC 106 (H) 03/20/2022 1043   LDLCALC 109 (H) 02/17/2013 1134     Risk  Assessment/Calculations:    CHA2DS2-VASc Score = 3   This indicates a 3.2% annual risk of stroke. The patient's score is based upon: CHF History: 0 HTN History: 1 Diabetes History: 0 Stroke History: 0 Vascular Disease History: 0 Age Score: 1 Gender Score: 1      The 10-year ASCVD risk score (Arnett DK, et al., 2019) is: 5.6%   Values used to calculate the score:     Age: 80 years     Sex: Female     Is Non-Hispanic African American: No     Diabetic: No     Tobacco smoker: No     Systolic Blood Pressure: 123456 mmHg     Is BP treated: Yes     HDL Cholesterol: 64 mg/dL     Total Cholesterol: 184 mg/dL   Physical Exam:    VS:   There were no vitals filed for this visit.    Wt Readings from Last 3 Encounters:  09/10/22 155 lb (70.3 kg)  09/06/22 156 lb 3.2 oz (70.9 kg)  08/09/22 146 lb (66.2 kg)     GEN:  Well nourished, well developed in no acute distress HEENT: Normal NECK: No JVD; No carotid bruits LYMPHATICS: No lymphadenopathy CARDIAC: regular rhythm ,no murmurs, rubs, gallops Vasc: faint radial pulses  RESPIRATORY:  Clear to  auscultation without rales, wheezing or rhonchi  ABDOMEN: Soft, non-tender, non-distended MUSCULOSKELETAL:  No edema; No deformity  SKIN: Warm and dry NEUROLOGIC:  Alert and oriented x 3 PSYCHIATRIC:  Normal affect   ASSESSMENT:    #Persistent Atrial Fibrillation: continue aspirin 81 mg daily with CHADS2VASC 2. She is asymptomatic. She has hx of hypothyroid/hyperthyroid. Echo shows normal LV function. bi-atrial enlargement, no significant valve dx. Underwent Tikosyn load completed 07/21/2021.  Did not maintain sinus rhythm, now s/p PVI - in sinus rhythm - continue Tikosyn 250 mcg BID;  - s/p PVI 08/09/2022 with Dr. Curt Dean; - continue eliquis - continue metop XL  Stable dissection R ICA dissection/R subclavian stenosis:  She has evidence of aneurysm and stable dissection of the right ICA and beaded appearance of the cervical ICAs. She has obstructed flow of the R subclavian that is pre-occlusive. Her findings of suggestive of c/f vasculopathy or fibromuscular dysplasia; there was no strong suspicion of this by Angela Dean. If having neurologic symptoms, can consider carotid duplex -continue metop per above - did not tolerate statins, started zetia 10 mg daily -evaluated by Angela Dean - no indication for surgery without symptoms and stable dissection.  #HTN: well controlled on lisinopril 10 mg daily PLAN:    In order of problems listed above:  Start zetia 10 mg Follow lipid panel in 3 months Follow up 12 months  Medication Adjustments/Labs and Tests Ordered: Current medicines are reviewed at length with the patient today.  Concerns regarding medicines are outlined above.    Signed, Angela Mayo, MD  09/10/2022 10:59 AM    [place]

## 2022-09-18 ENCOUNTER — Ambulatory Visit: Payer: Medicare Other | Admitting: Nurse Practitioner

## 2022-09-20 ENCOUNTER — Ambulatory Visit: Payer: No Typology Code available for payment source | Admitting: Nurse Practitioner

## 2022-09-20 ENCOUNTER — Encounter: Payer: Self-pay | Admitting: Nurse Practitioner

## 2022-09-20 VITALS — BP 108/66 | HR 78 | Temp 98.0°F | Resp 20 | Ht 64.0 in | Wt 158.0 lb

## 2022-09-20 DIAGNOSIS — D6869 Other thrombophilia: Secondary | ICD-10-CM | POA: Diagnosis not present

## 2022-09-20 DIAGNOSIS — I10 Essential (primary) hypertension: Secondary | ICD-10-CM | POA: Diagnosis not present

## 2022-09-20 DIAGNOSIS — E7800 Pure hypercholesterolemia, unspecified: Secondary | ICD-10-CM

## 2022-09-20 DIAGNOSIS — I4819 Other persistent atrial fibrillation: Secondary | ICD-10-CM | POA: Diagnosis not present

## 2022-09-20 DIAGNOSIS — E039 Hypothyroidism, unspecified: Secondary | ICD-10-CM

## 2022-09-20 LAB — CMP14+EGFR
ALT: 13 IU/L (ref 0–32)
AST: 17 IU/L (ref 0–40)
Albumin/Globulin Ratio: 1.9 (ref 1.2–2.2)
Albumin: 4.3 g/dL (ref 3.9–4.9)
Alkaline Phosphatase: 52 IU/L (ref 44–121)
BUN/Creatinine Ratio: 14 (ref 12–28)
BUN: 13 mg/dL (ref 8–27)
Bilirubin Total: 0.4 mg/dL (ref 0.0–1.2)
CO2: 25 mmol/L (ref 20–29)
Calcium: 9.3 mg/dL (ref 8.7–10.3)
Chloride: 103 mmol/L (ref 96–106)
Creatinine, Ser: 0.92 mg/dL (ref 0.57–1.00)
Globulin, Total: 2.3 g/dL (ref 1.5–4.5)
Glucose: 91 mg/dL (ref 70–99)
Potassium: 5.1 mmol/L (ref 3.5–5.2)
Sodium: 140 mmol/L (ref 134–144)
Total Protein: 6.6 g/dL (ref 6.0–8.5)
eGFR: 68 mL/min/{1.73_m2} (ref >59)

## 2022-09-20 LAB — CBC WITH DIFFERENTIAL/PLATELET
Basophils Absolute: 0 10*3/uL (ref 0.0–0.2)
Basos: 1 %
EOS (ABSOLUTE): 0.1 10*3/uL (ref 0.0–0.4)
Eos: 2 %
Hematocrit: 41.9 % (ref 34.0–46.6)
Hemoglobin: 13.8 g/dL (ref 11.1–15.9)
Immature Grans (Abs): 0 10*3/uL (ref 0.0–0.1)
Immature Granulocytes: 0 %
Lymphocytes Absolute: 2 10*3/uL (ref 0.7–3.1)
Lymphs: 31 %
MCH: 29.4 pg (ref 26.6–33.0)
MCHC: 32.9 g/dL (ref 31.5–35.7)
MCV: 89 fL (ref 79–97)
Monocytes Absolute: 0.5 10*3/uL (ref 0.1–0.9)
Monocytes: 8 %
Neutrophils Absolute: 3.6 10*3/uL (ref 1.4–7.0)
Neutrophils: 58 %
Platelets: 215 10*3/uL (ref 150–450)
RBC: 4.7 x10E6/uL (ref 3.77–5.28)
RDW: 13.8 % (ref 11.7–15.4)
WBC: 6.3 10*3/uL (ref 3.4–10.8)

## 2022-09-20 LAB — LIPID PANEL
Chol/HDL Ratio: 2.6 ratio (ref 0.0–4.4)
Cholesterol, Total: 182 mg/dL (ref 100–199)
HDL: 69 mg/dL (ref >39)
LDL Chol Calc (NIH): 101 mg/dL — ABNORMAL HIGH (ref 0–99)
Triglycerides: 64 mg/dL (ref 0–149)
VLDL Cholesterol Cal: 12 mg/dL (ref 5–40)

## 2022-09-20 MED ORDER — LISINOPRIL 10 MG PO TABS: 10.0000 mg | ORAL_TABLET | Freq: Every day | ORAL | 1 refills | Status: DC

## 2022-09-20 MED ORDER — DOFETILIDE 250 MCG PO CAPS: 250.0000 ug | ORAL_CAPSULE | Freq: Two times a day (BID) | ORAL | 10 refills | Status: DC

## 2022-09-20 MED ORDER — APIXABAN 5 MG PO TABS: 5.0000 mg | ORAL_TABLET | Freq: Two times a day (BID) | ORAL | 5 refills | Status: DC

## 2022-09-20 MED ORDER — METOPROLOL SUCCINATE ER 50 MG PO TB24: 50.0000 mg | ORAL_TABLET | Freq: Two times a day (BID) | ORAL | 1 refills | Status: DC

## 2022-09-20 MED ORDER — LEVOTHYROXINE SODIUM 100 MCG PO TABS: 100.0000 ug | ORAL_TABLET | Freq: Every day | ORAL | 1 refills | Status: DC

## 2022-09-20 MED ORDER — EZETIMIBE 10 MG PO TABS: 10.0000 mg | ORAL_TABLET | Freq: Every day | ORAL | 3 refills | Status: DC

## 2022-09-20 NOTE — Addendum Note (Signed)
Addended by: Rolena Infante on: 09/20/2022 11:06 AM   Modules accepted: Orders

## 2022-09-20 NOTE — Patient Instructions (Signed)

## 2022-09-20 NOTE — Progress Notes (Signed)
Subjective:    Patient ID: Angela Dean, female    DOB: 1956/04/24, 67 y.o.   MRN: [id number]   Chief Complaint: medical management of chronic issues     HPI:  Angela Dean is a 67 y.o. who identifies as a female who was assigned female at birth.   Social history: Lives with: husband Work history: works at [employer] in [place] [employer] in today for follow up of the following chronic medical issues:  1. Persistent atrial fibrillation ([place]) 2. Hypercoagulable state due to persistent atrial fibrillation [facility]) Had appointment with cardiology on 09/10/22. Only change that was made is that he added zetia to meds.  3. Primary hypertension No c/o chest pain, sob or headache. Does not check blood pressure at home. BP Readings from Last 3 Encounters:  09/20/22 108/66  09/06/22 104/80  08/09/22 138/62     4. Pure hypercholesterolemia Does try to watch diet and tries to stay active. Lab Results  Component Value Date   CHOL 184 03/20/2022   HDL 64 03/20/2022   LDLCALC 106 (H) 03/20/2022   TRIG 78 03/20/2022   CHOLHDL 2.9 03/20/2022     5. Acquired hypothyroidism No issues that she is aware of. Lab Results  Component Value Date   TSH 0.582 03/20/2022      New complaints: None today  No Known Allergies Outpatient Encounter Medications as of 09/20/2022  Medication Sig   apixaban (ELIQUIS) 5 MG TABS tablet Take 1 tablet (5 mg total) by mouth 2 (two) times daily.   dofetilide (TIKOSYN) 250 MCG capsule TAKE ONE CAPSULE BY MOUTH TWICE A DAY   ezetimibe (ZETIA) 10 MG tablet Take 1 tablet (10 mg total) by mouth daily.   levothyroxine (SYNTHROID) 100 MCG tablet TAKE ONE TABLET ONCE DAILY   lisinopril (ZESTRIL) 10 MG tablet Take 1 tablet (10 mg total) by mouth daily.   metoprolol succinate (TOPROL-XL) 50 MG 24 hr tablet Take 1 tablet (50 mg total) by mouth 2 (two) times daily.   No facility-administered encounter medications on file as of 09/20/2022.    Past Surgical  History:  Procedure Laterality Date   ATRIAL FIBRILLATION ABLATION N/A 08/09/2022   Procedure: ATRIAL FIBRILLATION ABLATION;  Surgeon: Constance Haw, MD;  Location: [facility];  Service: Cardiovascular;  Laterality: N/A;   CARDIOVERSION N/A 06/20/2021   Procedure: CARDIOVERSION;  Surgeon: Geralynn Rile, MD;  Location: [facility];  Service: Cardiovascular;  Laterality: N/A;   CARDIOVERSION N/A 07/20/2021   Procedure: CARDIOVERSION;  Surgeon: Lelon Perla, MD;  Location: [facility] ENDOSCOPY;  Service: Cardiovascular;  Laterality: N/A;   CARDIOVERSION N/A 01/23/2022   Procedure: CARDIOVERSION;  Surgeon: Sanda Klein, MD;  Location: [facility]OSCOPY;  Service: Cardiovascular;  Laterality: N/A;   MASTECTOMY Left 1997    Family History  Problem Relation Age of Onset   Heart disease Mother    Hypertension Mother    Cancer Sister    Cancer Sister    Cancer Sister       Controlled substance contract: n/a     Review of Systems  Constitutional:  Negative for diaphoresis.  Eyes:  Negative for pain.  Respiratory:  Negative for shortness of breath.   Cardiovascular:  Negative for chest pain, palpitations and leg swelling.  Gastrointestinal:  Negative for abdominal pain.  Endocrine: Negative for polydipsia.  Skin:  Negative for rash.  Neurological:  Negative for dizziness, weakness and headaches.  Hematological:  Does not bruise/bleed easily.  All other  systems reviewed and are negative.      Objective:   Physical Exam Vitals and nursing note reviewed.  Constitutional:      General: She is not in acute distress.    Appearance: Normal appearance. She is well-developed.  HENT:     Head: Normocephalic.     Right Ear: Tympanic membrane normal.     Left Ear: Tympanic membrane normal.     Nose: Nose normal.     Mouth/Throat:     Mouth: Mucous membranes are moist.  Eyes:     Pupils: Pupils are equal, round, and reactive to light.  Neck:     Vascular: No carotid  bruit or JVD.  Cardiovascular:     Rate and Rhythm: Normal rate and regular rhythm.     Heart sounds: Normal heart sounds.  Pulmonary:     Effort: Pulmonary effort is normal. No respiratory distress.     Breath sounds: Normal breath sounds. No wheezing or rales.  Chest:     Chest wall: No tenderness.  Abdominal:     General: Bowel sounds are normal. There is no distension or abdominal bruit.     Palpations: Abdomen is soft. There is no hepatomegaly, splenomegaly, mass or pulsatile mass.     Tenderness: There is no abdominal tenderness.  Musculoskeletal:        General: Normal range of motion.     Cervical back: Normal range of motion and neck supple.  Lymphadenopathy:     Cervical: No cervical adenopathy.  Skin:    General: Skin is warm and dry.  Neurological:     Mental Status: She is alert and oriented to person, place, and time.     Deep Tendon Reflexes: Reflexes are normal and symmetric.  Psychiatric:        Behavior: Behavior normal.        Thought Content: Thought content normal.        Judgment: Judgment normal.    BP 108/66   Pulse 78   Temp 98 F (36.7 C) (Temporal)   Resp 20   Ht 5\' 4"  (1.626 m)   Wt 158 lb (71.7 kg)   SpO2 98%   BMI 27.12 kg/m         Assessment & Plan:   [name] comes in today with chief complaint of No chief complaint on file.   Diagnosis and orders addressed:  1. Persistent atrial fibrillation (HCC) Avoid caffeine - apixaban (ELIQUIS) 5 MG TABS tablet; Take 1 tablet (5 mg total) by mouth 2 (two) times daily.  Dispense: 60 tablet; Refill: 5 - dofetilide (TIKOSYN) 250 MCG capsule; Take 1 capsule (250 mcg total) by mouth 2 (two) times daily.  Dispense: 60 capsule; Refill: 10 - metoprolol succinate (TOPROL-XL) 50 MG 24 hr tablet; Take 1 tablet (50 mg total) by mouth 2 (two) times daily.  Dispense: 180 tablet; Refill: 1  2. Hypercoagulable state due to persistent atrial fibrillation [facility]) Keep follow up with cardiology  3.  Primary hypertension Low sodium diet - lisinopril (ZESTRIL) 10 MG tablet; Take 1 tablet (10 mg total) by mouth daily.  Dispense: 90 tablet; Refill: 1  4. Pure hypercholesterolemia Low fat diet - ezetimibe (ZETIA) 10 MG tablet; Take 1 tablet (10 mg total) by mouth daily.  Dispense: 90 tablet; Refill: 3  5. Acquired hypothyroidism Labs pending - levothyroxine (SYNTHROID) 100 MCG tablet; Take 1 tablet (100 mcg total) by mouth daily.  Dispense: 90 tablet; Refill: 1   Labs pending  Health Maintenance reviewed Diet and exercise encouraged  Follow up plan: 6 months   Mary-Margaret Hassell Done, FNP

## 2022-11-12 ENCOUNTER — Ambulatory Visit
Admission: RE | Admit: 2022-11-12 | Discharge: 2022-11-12 | Disposition: A | Discharge status: Home / Self Care | Payer: No Typology Code available for payment source | Source: Ambulatory Visit | Attending: Nurse Practitioner | Admitting: Nurse Practitioner

## 2022-11-12 ENCOUNTER — Encounter: Payer: Self-pay | Admitting: Cardiology

## 2022-11-12 ENCOUNTER — Ambulatory Visit: Payer: No Typology Code available for payment source | Attending: Cardiology | Admitting: Cardiology

## 2022-11-12 ENCOUNTER — Other Ambulatory Visit: Payer: Self-pay | Admitting: Nurse Practitioner

## 2022-11-12 VITALS — BP 128/84 | HR 68 | Ht 64.0 in | Wt 156.0 lb

## 2022-11-12 DIAGNOSIS — Z1231 Encounter for screening mammogram for malignant neoplasm of breast: Secondary | ICD-10-CM

## 2022-11-12 DIAGNOSIS — D6869 Other thrombophilia: Secondary | ICD-10-CM | POA: Diagnosis not present

## 2022-11-12 DIAGNOSIS — I4819 Other persistent atrial fibrillation: Secondary | ICD-10-CM | POA: Diagnosis not present

## 2022-11-12 DIAGNOSIS — Z79899 Other long term (current) drug therapy: Secondary | ICD-10-CM | POA: Diagnosis not present

## 2022-11-12 NOTE — Progress Notes (Signed)
Electrophysiology Office Note   Date:  11/12/2022   ID:  Angela Dean, Angela Dean 12-26-1955, MRN [id number]  PCP:  Bennie Pierini, FNP  Cardiologist:  Carolan Clines Primary Electrophysiologist:  Jaimey Franchini Jorja Loa, MD    Chief Complaint: AF   History of Present Illness: Angela Dean is a 67 y.o. female who is being seen today for the evaluation of AF at the request of Daphine Deutscher, Mary-Margaret, *. Presenting today for electrophysiology evaluation.  She has a history of atrial fibrillation, thyroid nodule post thyroidectomy, breast cancer postmastectomy on the left.  She was she is on dofetilide.  She unfortunately had more frequent episodes of atrial fibrillation and is now post ablation 08/09/2022.  Today, denies symptoms of palpitations, chest pain, shortness of breath, orthopnea, PND, lower extremity edema, claudication, dizziness, presyncope, syncope, bleeding, or neurologic sequela. The patient is tolerating medications without difficulties.  Since being seen she has done well.  She has noted no further episodes of atrial fibrillation.  She was happy with her control.   Past Medical History:  Diagnosis Date   Atrial fibrillation (HCC)    Breast cancer (HCC)    Cancer (HCC)    Thyroid disease    Past Surgical History:  Procedure Laterality Date   ATRIAL FIBRILLATION ABLATION N/A 08/09/2022   Procedure: ATRIAL FIBRILLATION ABLATION;  Surgeon: Regan Lemming, MD;  [facility];  Service: Cardiovascular;  Laterality: N/A;   CARDIOVERSION N/A 06/20/2021   Procedure: CARDIOVERSION;  Surgeon: Sande Rives, MD;  Location[facility] ENDOSCOPY;  Service: Cardiovascular;  Laterality: N/A;   CARDIOVERSION N/A 07/20/2021   Procedure: CARDIOVERSION;  Surgeon: Lewayne Bunting, MD;  Location: [facility] ENDOSCOPY;  Service: Cardiovascular;  Laterality: N/A;   CARDIOVERSION N/A 01/23/2022   Procedure: CARDIOVERSION;  Surgeon: Thurmon Fair, MD;  [facility] ENDOSCOPY;   Service: Cardiovascular;  Laterality: N/A;   MASTECTOMY Left 1997     Current Outpatient Medications  Medication Sig Dispense Refill   apixaban (ELIQUIS) 5 MG TABS tablet Take 1 tablet (5 mg total) by mouth 2 (two) times daily. 60 tablet 5   dofetilide (TIKOSYN) 250 MCG capsule Take 1 capsule (250 mcg total) by mouth 2 (two) times daily. 60 capsule 10   ezetimibe (ZETIA) 10 MG tablet Take 1 tablet (10 mg total) by mouth daily. 90 tablet 3   levothyroxine (SYNTHROID) 100 MCG tablet Take 1 tablet (100 mcg total) by mouth daily. 90 tablet 1   lisinopril (ZESTRIL) 10 MG tablet Take 1 tablet (10 mg total) by mouth daily. 90 tablet 1   metoprolol succinate (TOPROL-XL) 50 MG 24 hr tablet Take 1 tablet (50 mg total) by mouth 2 (two) times daily. 180 tablet 1   No current facility-administered medications for this visit.    Allergies:   Patient has no known allergies.   Social History:  The patient  reports that she has never smoked. She has never used smokeless tobacco. She reports that she does not drink alcohol and does not use drugs.   Family History:  The patient's family history includes Cancer in her sister, sister, and sister; Heart disease in her mother; Hypertension in her mother.   ROS:  Please see the history of present illness.   Otherwise, review of systems is positive for none.   All other systems are reviewed and negative.   PHYSICAL EXAM: VS:  BP 128/84   Pulse 68   Ht 5\' 4"  (1.626 m)   Wt 156 lb (70.8  kg)   SpO2 96%   BMI 26.78 kg/m  , BMI Body mass index is 26.78 kg/m. GEN: Well nourished, well developed, in no acute distress  HEENT: normal  Neck: no JVD, carotid bruits, or masses Cardiac: RRR; no murmurs, rubs, or gallops,no edema  Respiratory:  clear to auscultation bilaterally, normal work of breathing GI: soft, nontender, nondistended, + BS MS: no deformity or atrophy  Skin: warm and dry Neuro:  Strength and sensation are intact Psych: euthymic mood, full  affect  EKG:  EKG is ordered today. Personal review of the ekg ordered shows sinus rhythm, rate 67   Recent Labs: 03/20/2022: TSH 0.582 09/06/2022: Magnesium 2.0 09/20/2022: ALT 13; BUN 13; Creatinine, Ser 0.92; Hemoglobin 13.8; Platelets 215; Potassium 5.1; Sodium 140    Lipid Panel     Component Value Date/Time   CHOL 182 09/20/2022 1111   TRIG 64 09/20/2022 1111   TRIG 83 06/03/2014 1104   HDL 69 09/20/2022 1111   HDL 77 06/03/2014 1104   CHOLHDL 2.6 09/20/2022 1111   LDLCALC 101 (H) 09/20/2022 1111   LDLCALC 109 (H) 02/17/2013 1134     Wt Readings from Last 3 Encounters:  11/12/22 156 lb (70.8 kg)  09/20/22 158 lb (71.7 kg)  09/10/22 155 lb (70.3 kg)      Other studies Reviewed: Additional studies/ records that were reviewed today include: TTE 05/17/21  Review of the above records today demonstrates:   1. Left ventricular ejection fraction, by estimation, is 50% with beat to  beat variablity in afib. The left ventricle has low normal function. The  left ventricle has no regional wall motion abnormalities. There is mild  left ventricular hypertrophy. Left   ventricular diastolic parameters are indeterminate.   2. Right ventricular systolic function is normal. The right ventricular  size is normal. There is normal pulmonary artery systolic pressure. The  estimated right ventricular systolic pressure is 27.8 mmHg.   3. Left atrial size was severely dilated.   4. Right atrial size was severely dilated.   5. The mitral valve is grossly normal. Mild mitral valve regurgitation.  No evidence of mitral stenosis.   6. The aortic valve is tricuspid. There is mild thickening of the aortic  valve. Aortic valve regurgitation is mild. No aortic stenosis is present.   7. The inferior vena cava is normal in size with greater than 50%  respiratory variability, suggesting right atrial pressure of 3 mmHg.   Cardiac monitor 12/22/2021 personally reviewed 100% atrial fibrillation  burden Less than 1% ventricular ectopy Heart rate average 100 bpm No triggered episodes  ASSESSMENT AND PLAN:  1.  Persistent atrial fibrillation: Currently on dofetilide and Eliquis.  CHA2DS2-VASc of 2.  Status post ablation 08/09/2022.  She remains in sinus rhythm since her ablation.  Norvella Loscalzo continue with current management.  2.  Secondary hypercoagulable state: Currently on Eliquis for atrial fibrillation  3.  High risk medication monitoring: Currently on dofetilide.  QT interval remains narrow.  Recent labs within normal limits.    Current medicines are reviewed at length with the patient today.   The patient does not have concerns regarding her medicines.  The following changes were made today: None  Labs/ tests ordered today include:  Orders Placed This Encounter  Procedures   EKG 12-Lead     Disposition:   FU 6 months  Signed, Anaisabel Pederson Jorja Loa, MD  11/12/2022 2:52 PM     [facility] [address] [place]  [address] [phone] (office) [phone] (fax)

## 2023-01-14 ENCOUNTER — Other Ambulatory Visit: Payer: Self-pay

## 2023-01-14 DIAGNOSIS — E785 Hyperlipidemia, unspecified: Secondary | ICD-10-CM

## 2023-01-15 LAB — LIPID PANEL
Chol/HDL Ratio: 2.9 ratio (ref 0.0–4.4)
Cholesterol, Total: 197 mg/dL (ref 100–199)
HDL: 68 mg/dL (ref >39)
LDL Chol Calc (NIH): 115 mg/dL — ABNORMAL HIGH (ref 0–99)
Triglycerides: 80 mg/dL (ref 0–149)
VLDL Cholesterol Cal: 14 mg/dL (ref 5–40)

## 2023-03-25 ENCOUNTER — Other Ambulatory Visit (INDEPENDENT_AMBULATORY_CARE_PROVIDER_SITE_OTHER): Payer: 59

## 2023-03-25 ENCOUNTER — Encounter: Payer: Self-pay | Admitting: Nurse Practitioner

## 2023-03-25 ENCOUNTER — Other Ambulatory Visit: Payer: Self-pay | Admitting: Nurse Practitioner

## 2023-03-25 ENCOUNTER — Ambulatory Visit (INDEPENDENT_AMBULATORY_CARE_PROVIDER_SITE_OTHER): Payer: 59 | Admitting: Nurse Practitioner

## 2023-03-25 VITALS — BP 116/62 | HR 67 | Temp 98.5°F | Resp 20 | Ht 64.0 in | Wt 159.0 lb

## 2023-03-25 DIAGNOSIS — I10 Essential (primary) hypertension: Secondary | ICD-10-CM | POA: Diagnosis not present

## 2023-03-25 DIAGNOSIS — Z0000 Encounter for general adult medical examination without abnormal findings: Secondary | ICD-10-CM

## 2023-03-25 DIAGNOSIS — E7800 Pure hypercholesterolemia, unspecified: Secondary | ICD-10-CM | POA: Diagnosis not present

## 2023-03-25 DIAGNOSIS — E039 Hypothyroidism, unspecified: Secondary | ICD-10-CM

## 2023-03-25 DIAGNOSIS — Z780 Asymptomatic menopausal state: Secondary | ICD-10-CM

## 2023-03-25 DIAGNOSIS — Z0001 Encounter for general adult medical examination with abnormal findings: Secondary | ICD-10-CM | POA: Diagnosis not present

## 2023-03-25 DIAGNOSIS — I4819 Other persistent atrial fibrillation: Secondary | ICD-10-CM

## 2023-03-25 DIAGNOSIS — D6869 Other thrombophilia: Secondary | ICD-10-CM

## 2023-03-25 MED ORDER — LISINOPRIL 10 MG PO TABS: 10.0000 mg | ORAL_TABLET | Freq: Every day | ORAL | 1 refills | Status: DC

## 2023-03-25 MED ORDER — EZETIMIBE 10 MG PO TABS: 10.0000 mg | ORAL_TABLET | Freq: Every day | ORAL | 3 refills | Status: DC

## 2023-03-25 MED ORDER — APIXABAN 5 MG PO TABS: 5.0000 mg | ORAL_TABLET | Freq: Two times a day (BID) | ORAL | 5 refills | Status: DC

## 2023-03-25 MED ORDER — DOFETILIDE 250 MCG PO CAPS: 250.0000 ug | ORAL_CAPSULE | Freq: Two times a day (BID) | ORAL | 10 refills | Status: DC

## 2023-03-25 MED ORDER — METOPROLOL SUCCINATE ER 50 MG PO TB24: 50.0000 mg | ORAL_TABLET | Freq: Two times a day (BID) | ORAL | 1 refills | Status: DC

## 2023-03-25 MED ORDER — LEVOTHYROXINE SODIUM 100 MCG PO TABS: 100.0000 ug | ORAL_TABLET | Freq: Every day | ORAL | 1 refills | Status: DC

## 2023-03-25 NOTE — Patient Instructions (Signed)

## 2023-03-25 NOTE — Progress Notes (Signed)
Subjective:    Patient ID: Angela Dean, female    DOB: 04-06-1956, 67 y.o.   MRN: [id number]   Chief Complaint: Annual Exam    HPI:  Angela Dean is a 67 y.o. who identifies as a female who was assigned female at birth.   Social history: Lives with: husband Work history: retired   Water engineer in today for follow up of the following chronic medical issues:  1. Primary hypertension No c/o chest  pain, sob or headache. Does not check blood pressure at home. BP Readings from Last 3 Encounters:  11/12/22 128/84  09/20/22 108/66  09/06/22 104/80     2. Pure hypercholesterolemia Does watch diet and stays very active Lab Results  Component Value Date   CHOL 197 01/14/2023   HDL 68 01/14/2023   LDLCALC 115 (H) 01/14/2023   TRIG 80 01/14/2023   CHOLHDL 2.9 01/14/2023     3. Acquired hypothyroidism No issues that aware of. Lab Results  Component Value Date   TSH 0.582 03/20/2022     4. Persistent atrial fibrillation (HCC) No palpitations or heart racing  5. Hypercoagulable state due to persistent atrial fibrillation (HCC) No bleeding issues   New complaints: None today  No Known Allergies Outpatient Encounter Medications as of 03/25/2023  Medication Sig   apixaban (ELIQUIS) 5 MG TABS tablet Take 1 tablet (5 mg total) by mouth 2 (two) times daily.   dofetilide (TIKOSYN) 250 MCG capsule Take 1 capsule (250 mcg total) by mouth 2 (two) times daily.   ezetimibe (ZETIA) 10 MG tablet Take 1 tablet (10 mg total) by mouth daily.   levothyroxine (SYNTHROID) 100 MCG tablet Take 1 tablet (100 mcg total) by mouth daily.   lisinopril (ZESTRIL) 10 MG tablet Take 1 tablet (10 mg total) by mouth daily.   metoprolol succinate (TOPROL-XL) 50 MG 24 hr tablet Take 1 tablet (50 mg total) by mouth 2 (two) times daily.   No facility-administered encounter medications on file as of 03/25/2023.    Past Surgical History:  Procedure Laterality Date   ATRIAL FIBRILLATION ABLATION N/A  08/09/2022   Procedure: ATRIAL FIBRILLATION ABLATION;  Surgeon: Regan Lemming, MD;  Location: [facility] INVASIVE CV LAB;  Service: Cardiovascular;  Laterality: N/A;   CARDIOVERSION N/A 06/20/2021   Procedure: CARDIOVERSION;  Surgeon: Sande Rives, MD;  Location: [facility] ENDOSCOPY;  Service: Cardiovascular;  Laterality: N/A;   CARDIOVERSION N/A 07/20/2021   Procedure: CARDIOVERSION;  Surgeon: Lewayne Bunting, MD;  Location: [facility] ENDOSCOPY;  Service: Cardiovascular;  Laterality: N/A;   CARDIOVERSION N/A 01/23/2022   Procedure: CARDIOVERSION;  Surgeon: Thurmon Fair, MD;  Location: [facility] ENDOSCOPY;  Service: Cardiovascular;  Laterality: N/A;   MASTECTOMY Left 1997    Family History  Problem Relation Age of Onset   Heart disease Mother    Hypertension Mother    Cancer Sister    Cancer Sister    Cancer Sister       Controlled substance contract: n/a     Review of Systems  Constitutional:  Negative for diaphoresis.  Eyes:  Negative for pain.  Respiratory:  Negative for shortness of breath.   Cardiovascular:  Negative for chest pain, palpitations and leg swelling.  Gastrointestinal:  Negative for abdominal pain.  Endocrine: Negative for polydipsia.  Skin:  Negative for rash.  Neurological:  Negative for dizziness, weakness and headaches.  Hematological:  Does not bruise/bleed easily.  All other systems reviewed and are negative.      Objective:  Physical Exam Vitals and nursing note reviewed.  Constitutional:      General: She is not in acute distress.    Appearance: Normal appearance. She is well-developed.  HENT:     Head: Normocephalic.     Right Ear: Tympanic membrane normal.     Left Ear: Tympanic membrane normal.     Nose: Nose normal.     Mouth/Throat:     Mouth: Mucous membranes are moist.  Eyes:     Pupils: Pupils are equal, round, and reactive to light.  Neck:     Vascular: No carotid bruit or JVD.  Cardiovascular:     Rate and Rhythm: Normal rate and  regular rhythm.     Heart sounds: Normal heart sounds.  Pulmonary:     Effort: Pulmonary effort is normal. No respiratory distress.     Breath sounds: Normal breath sounds. No wheezing or rales.  Chest:     Chest wall: No tenderness.  Abdominal:     General: Bowel sounds are normal. There is no distension or abdominal bruit.     Palpations: Abdomen is soft. There is no hepatomegaly, splenomegaly, mass or pulsatile mass.     Tenderness: There is no abdominal tenderness.  Musculoskeletal:        General: Normal range of motion.     Cervical back: Normal range of motion and neck supple.  Lymphadenopathy:     Cervical: No cervical adenopathy.  Skin:    General: Skin is warm and dry.  Neurological:     Mental Status: She is alert and oriented to person, place, and time.     Deep Tendon Reflexes: Reflexes are normal and symmetric.  Psychiatric:        Behavior: Behavior normal.        Thought Content: Thought content normal.        Judgment: Judgment normal.    BP 116/62   Pulse 67   Temp 98.5 F (36.9 C) (Temporal)   Resp 20   Ht 5\' 4"  (1.626 m)   Wt 159 lb (72.1 kg)   SpO2 92%   BMI 27.29 kg/m         Assessment & Plan:   Angela Dean comes in today with chief complaint of Annual Exam   Diagnosis and orders addressed:  1. Annual physical exam  2. Primary hypertension Low sodium diet - lisinopril (ZESTRIL) 10 MG tablet; Take 1 tablet (10 mg total) by mouth daily.  Dispense: 90 tablet; Refill: 1  3. Pure hypercholesterolemia Low fat diet - ezetimibe (ZETIA) 10 MG tablet; Take 1 tablet (10 mg total) by mouth daily.  Dispense: 90 tablet; Refill: 3  4. Acquired hypothyroidism Labs pending - levothyroxine (SYNTHROID) 100 MCG tablet; Take 1 tablet (100 mcg total) by mouth daily.  Dispense: 90 tablet; Refill: 1  5. Persistent atrial fibrillation (HCC) Avoid caffeine - apixaban (ELIQUIS) 5 MG TABS tablet; Take 1 tablet (5 mg total) by mouth 2 (two) times daily.   Dispense: 60 tablet; Refill: 5 - dofetilide (TIKOSYN) 250 MCG capsule; Take 1 capsule (250 mcg total) by mouth 2 (two) times daily.  Dispense: 60 capsule; Refill: 10 - metoprolol succinate (TOPROL-XL) 50 MG 24 hr tablet; Take 1 tablet (50 mg total) by mouth 2 (two) times daily.  Dispense: 180 tablet; Refill: 1  6. Hypercoagulable state due to persistent atrial fibrillation [facility]) Report any bleeding issues   Labs pending Health Maintenance reviewed Diet and exercise encouraged  Follow up plan: 6  months   Mary-Margaret Daphine Deutscher, FNP

## 2023-03-26 LAB — CBC WITH DIFFERENTIAL/PLATELET
Basophils Absolute: 0 10*3/uL (ref 0.0–0.2)
Basos: 0 %
EOS (ABSOLUTE): 0.1 10*3/uL (ref 0.0–0.4)
Eos: 1 %
Hematocrit: 42.1 % (ref 34.0–46.6)
Hemoglobin: 13.3 g/dL (ref 11.1–15.9)
Immature Grans (Abs): 0 10*3/uL (ref 0.0–0.1)
Immature Granulocytes: 0 %
Lymphocytes Absolute: 1.5 10*3/uL (ref 0.7–3.1)
Lymphs: 22 %
MCH: 29.1 pg (ref 26.6–33.0)
MCHC: 31.6 g/dL (ref 31.5–35.7)
MCV: 92 fL (ref 79–97)
Monocytes Absolute: 0.5 10*3/uL (ref 0.1–0.9)
Monocytes: 7 %
Neutrophils Absolute: 4.7 10*3/uL (ref 1.4–7.0)
Neutrophils: 70 %
Platelets: 226 10*3/uL (ref 150–450)
RBC: 4.57 x10E6/uL (ref 3.77–5.28)
RDW: 13.8 % (ref 11.7–15.4)
WBC: 6.8 10*3/uL (ref 3.4–10.8)

## 2023-03-26 LAB — CMP14+EGFR
ALT: 16 [IU]/L (ref 0–32)
AST: 21 [IU]/L (ref 0–40)
Albumin: 4.4 g/dL (ref 3.9–4.9)
Alkaline Phosphatase: 47 [IU]/L (ref 44–121)
BUN/Creatinine Ratio: 17 (ref 12–28)
BUN: 14 mg/dL (ref 8–27)
Bilirubin Total: 0.5 mg/dL (ref 0.0–1.2)
CO2: 20 mmol/L (ref 20–29)
Calcium: 9.6 mg/dL (ref 8.7–10.3)
Chloride: 106 mmol/L (ref 96–106)
Creatinine, Ser: 0.84 mg/dL (ref 0.57–1.00)
Globulin, Total: 2.5 g/dL (ref 1.5–4.5)
Glucose: 91 mg/dL (ref 70–99)
Potassium: 4.2 mmol/L (ref 3.5–5.2)
Sodium: 141 mmol/L (ref 134–144)
Total Protein: 6.9 g/dL (ref 6.0–8.5)
eGFR: 76 mL/min/{1.73_m2} (ref >59)

## 2023-03-26 LAB — LIPID PANEL
Chol/HDL Ratio: 2.6 {ratio} (ref 0.0–4.4)
Cholesterol, Total: 180 mg/dL (ref 100–199)
HDL: 70 mg/dL (ref >39)
LDL Chol Calc (NIH): 98 mg/dL (ref 0–99)
Triglycerides: 63 mg/dL (ref 0–149)
VLDL Cholesterol Cal: 12 mg/dL (ref 5–40)

## 2023-03-29 ENCOUNTER — Other Ambulatory Visit: Payer: Self-pay

## 2023-03-29 ENCOUNTER — Other Ambulatory Visit: Payer: Self-pay | Admitting: Nurse Practitioner

## 2023-03-29 DIAGNOSIS — M810 Age-related osteoporosis without current pathological fracture: Secondary | ICD-10-CM

## 2023-03-29 DIAGNOSIS — Z1211 Encounter for screening for malignant neoplasm of colon: Secondary | ICD-10-CM

## 2023-03-29 DIAGNOSIS — Z1212 Encounter for screening for malignant neoplasm of rectum: Secondary | ICD-10-CM

## 2023-04-02 ENCOUNTER — Telehealth: Payer: Self-pay

## 2023-04-02 NOTE — Progress Notes (Signed)
   Care Guide Note  04/02/2023 Name: Angela Dean MRN: [id number] DOB: 1956-03-03  Referred by: Bennie Pierini, FNP Reason for referral : Care Coordination (Outreach to schedule with Pharmd )   Angela Dean is a 67 y.o. year old female who is a primary care patient of Bennie Pierini, FNP. Angela Dean was referred to the pharmacist for assistance related to  Osteoporosis .    An unsuccessful telephone outreach was attempted today to contact the patient who was referred to the pharmacy team for assistance with medication management. Additional attempts will be made to contact the patient.   [name], RMA Care Guide [facility]  [place], [place] [address] Direct Dial: [phone] [email or web address]

## 2023-04-05 NOTE — Progress Notes (Signed)
   Care Guide Note  04/05/2023 Name: Angela Dean MRN: [id number] DOB: 1956-02-12  Referred by: Bennie Pierini, FNP Reason for referral : Care Coordination (Outreach to schedule with Pharmd )   Angela Dean is a 67 y.o. year old female who is a primary care patient of Bennie Pierini, FNP. Angela Dean was referred to the pharmacist for assistance related to  osteoporosis .    A second unsuccessful telephone outreach was attempted today to contact the patient who was referred to the pharmacy team for assistance with medication management. Additional attempts will be made to contact the patient.  Angela Dean, RMA Care Guide [facility]  [place], [place] [address] Direct Dial: [phone] [email or web address]

## 2023-04-09 NOTE — Progress Notes (Signed)
   Care Guide Note  04/09/2023 Name: Angela Dean MRN: [id number] DOB: Jun 24, 1956  Referred by: Bennie Pierini, FNP Reason for referral : Care Coordination (Outreach to schedule with Pharmd )   Angela Dean is a 67 y.o. year old female who is a primary care patient of Bennie Pierini, FNP. Angela Dean was referred to the pharmacist for assistance related to  osteoporosis  .    A third unsuccessful telephone outreach was attempted today to contact the patient who was referred to the pharmacy team for assistance with medication management. The Population Health team is pleased to engage with this patient at any time in the future upon receipt of referral and should he/she be interested in assistance from the [facility] team.   [name], RMA Care Guide [facility]  [place], [place] [address] Direct Dial: [phone] [name]

## 2023-06-21 ENCOUNTER — Encounter: Payer: Self-pay | Admitting: Physician Assistant

## 2023-06-21 ENCOUNTER — Ambulatory Visit: Payer: 59 | Attending: Physician Assistant | Admitting: Physician Assistant

## 2023-06-21 VITALS — BP 128/74 | HR 67 | Ht 65.0 in | Wt 138.8 lb

## 2023-06-21 DIAGNOSIS — I4819 Other persistent atrial fibrillation: Secondary | ICD-10-CM

## 2023-06-21 DIAGNOSIS — I10 Essential (primary) hypertension: Secondary | ICD-10-CM | POA: Diagnosis not present

## 2023-06-21 DIAGNOSIS — Z5181 Encounter for therapeutic drug level monitoring: Secondary | ICD-10-CM

## 2023-06-21 DIAGNOSIS — I739 Peripheral vascular disease, unspecified: Secondary | ICD-10-CM | POA: Diagnosis not present

## 2023-06-21 DIAGNOSIS — Z79899 Other long term (current) drug therapy: Secondary | ICD-10-CM

## 2023-06-21 NOTE — Patient Instructions (Addendum)
Medication Instructions:   Your physician recommends that you continue on your current medications as directed. Please refer to the Current Medication list given to you today.   *If you need a refill on your cardiac medications before your next appointment, please call your pharmacy*   Lab Work: NONE ORDERED  TODAY      If you have labs (blood work) drawn today and your tests are completely normal, you will receive your results only by: MyChart Message (if you have MyChart) OR A paper copy in the mail If you have any lab test that is abnormal or we need to change your treatment, we will call you to review the results.   Testing/Procedures: NONE ORDERED  TODAY     Follow-Up: At [facility], you and your health needs are our priority.  As part of our continuing mission to provide you with exceptional heart care, we have created designated Provider Care Teams.  These Care Teams include your primary Cardiologist (physician) and Advanced Practice Providers (APPs -  Physician Assistants and Nurse Practitioners) who all work together to provide you with the care you need, when you need it.  We recommend signing up for the patient portal called "MyChart".  Sign up information is provided on this After Visit Summary.  MyChart is used to connect with patients for Virtual Visits (Telemedicine).  Patients are able to view lab/test results, encounter notes, upcoming appointments, etc.  Non-urgent messages can be sent to your provider as well.   To learn more about what you can do with MyChart, go to [email or web address].    Your next appointment:   WITH DR. BRANCH IN [facility]  2025   4 month(s)  ( CONTACT  CASSIE HALL/ ANGELINE HAMMER FOR EP SCHEDULING ISSUES )    Provider:    You may see Will Jorja Loa, MD  or one of the following Advanced Practice Providers on your designated Care Team:   Francis Dowse, [place]   Other Instructions

## 2023-06-21 NOTE — Addendum Note (Signed)
Addended by: Oleta Mouse on: 06/21/2023 02:23 PM   Modules accepted: Orders

## 2023-06-21 NOTE — Progress Notes (Signed)
Cardiology Office Note:  .   Date:  06/21/2023  ID:  Angela Dean, DOB 19-Aug-1955, MRN [id number] PCP: Bennie Pierini, FNP  [facility] Providers Cardiologist:  Maisie Fus, MD Electrophysiologist:  Regan Lemming, MD {  History of Present Illness: Angela Dean is a 67 y.o. female w/PMHx of a thyroid nodule (s/p thyroidectomy), breast ca (s/o L mastectomy), PVD (R subclavian artery stenosis), AFib  Saw Dr. Elberta Fortis 11/12/22, doing well, no symptoms of AFib, no changes   Today's visit is scheduled as a tikosyn visit  ROS:   She comes today with her daughter Doing well No CP, palpitations or cardiac awareness Isn't sure if she would recognize Afib, but as far as she knows, hasn't had any. 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 Atrial fibrillation upon presentation.   2. Successful electrical isolation and anatomical encircling of all four pulmonary veins with radiofrequency current.  A WACA approach was used 3. Atrial fibrillation successfully cardioverted to sinus rhythm. 4. No early apparent complications.  08/01/22: Cardiac CT IMPRESSION: 1.  Severe bi atrial enlargement 2.  No LAA thrombus 3.  Aneurysmal atrial septum with PFO 4.  Normal PV anatomy with right middle PV see measurements above 5.  Normal ascending thoracic aorta 3.7 cm 6.  No pericardial effusion 7.  Breast implants noted 8.  Calcium score is 0   05/16/21: TTE 1. Left ventricular ejection fraction, by estimation, is 50% with beat to  beat variablity in afib. The left ventricle has low normal function. The  left ventricle has no  regional wall motion abnormalities. There is mild  left ventricular hypertrophy. Left   ventricular diastolic parameters are indeterminate.   2. Right ventricular systolic function is normal. The right ventricular  size is normal. There is normal pulmonary artery systolic pressure. The  estimated right ventricular systolic pressure is 27.8 mmHg.   3. Left atrial size was severely dilated.   4. Right atrial size was severely dilated.   5. The mitral valve is grossly normal. Mild mitral valve regurgitation.  No evidence of mitral stenosis.   6. The aortic valve is tricuspid. There is mild thickening of the aortic  valve. Aortic valve regurgitation is mild. No aortic stenosis is present.   7. The inferior vena cava is normal in size with greater than 50%  respiratory variability, suggesting right atrial pressure of 3 mmHg.    Risk Assessment/Calculations:    Physical Exam:   VS:  There were no vitals taken for this visit.   Wt Readings from Last 3 Encounters:  03/25/23 159 lb (72.1 kg)  11/12/22 156 lb (70.8 kg)  09/20/22 158 lb (71.7 kg)    GEN: Well nourished, well developed in no acute distress NECK: No JVD; No carotid bruits CARDIAC: RRR, no murmurs, rubs, gallops RESPIRATORY:  CTA b/l without rales, wheezing or rhonchi  ABDOMEN: Soft, non-tender, non-distended EXTREMITIES:  R  radial pulse easily palpable, though uneven from left, no cyanosis No edema; No deformity   ASSESSMENT AND PLAN: .    persistent AFib CHA2DS2Vasc is 3, on Eliquis, appropriately dosed no burden by  symptoms Tikosyn, w/stable QTc Med list is OK Labs today  Secondary hypercoagulable state 2/2 AFib  3. PVD HTN No claudication RUE BP is done on the RUE, she has had a mastectomy on the left so not done left She is due to see Dr. Wyline Mood in march    Dispo: back in 3mo, sooner if needed  Signed, Sheilah Pigeon, PA-C

## 2023-06-22 LAB — BASIC METABOLIC PANEL
BUN/Creatinine Ratio: 15 (ref 12–28)
BUN: 13 mg/dL (ref 8–27)
CO2: 24 mmol/L (ref 20–29)
Calcium: 9.8 mg/dL (ref 8.7–10.3)
Chloride: 104 mmol/L (ref 96–106)
Creatinine, Ser: 0.87 mg/dL (ref 0.57–1.00)
Glucose: 83 mg/dL (ref 70–99)
Potassium: 4.4 mmol/L (ref 3.5–5.2)
Sodium: 143 mmol/L (ref 134–144)
eGFR: 73 mL/min/{1.73_m2} (ref >59)

## 2023-06-22 LAB — MAGNESIUM: Magnesium: 2.1 mg/dL (ref 1.6–2.3)

## 2023-08-02 ENCOUNTER — Other Ambulatory Visit (HOSPITAL_COMMUNITY): Payer: Self-pay

## 2023-08-02 ENCOUNTER — Telehealth: Payer: Self-pay

## 2023-08-02 NOTE — Telephone Encounter (Signed)
 Pharmacy Patient Advocate Encounter   Received notification from Pt Calls Messages that prior authorization for Dofetilide  capsules is required/requested.   Insurance verification completed.   The patient is insured through [facility] .   Per test claim: PA required; PA submitted to above mentioned insurance via CoverMyMeds Key/confirmation #/EOC [facility] Status is pending

## 2023-08-02 NOTE — Telephone Encounter (Signed)
 Copied from CRM [id number]. Topic: Clinical - Prescription Issue >> Aug 02, 2023 10:17 AM Nestora J wrote: Reason for CRM: Pt states she is out of dofetilide  (TIKOSYN ) 250 MCG and her pharmacy is still awaiting prior authorization to fill the medication. Please follow up with pt once the order has been sent  Callback # [id number] Cell [phone]

## 2023-08-02 NOTE — Telephone Encounter (Signed)
 PA request has been Submitted. New Encounter created for follow up. For additional info see Pharmacy Prior Auth telephone encounter from 08/02/23.

## 2023-08-05 ENCOUNTER — Other Ambulatory Visit (HOSPITAL_COMMUNITY): Payer: Self-pay

## 2023-08-05 NOTE — Telephone Encounter (Signed)
 Pharmacy Patient Advocate Encounter  Received notification from [facility] that Prior Authorization for Dofetilide  capsules  has been APPROVED from 08/02/23 to 08/01/24. Unable to obtain price due to refill too soon rejection, last fill date 08/03/23 next available fill date3/4/25   PA #/Case ID/Reference #: [id number]

## 2023-09-20 ENCOUNTER — Ambulatory Visit: Payer: Medicare Other | Admitting: Nurse Practitioner

## 2023-09-23 ENCOUNTER — Encounter: Payer: Self-pay | Admitting: Internal Medicine

## 2023-09-23 ENCOUNTER — Ambulatory Visit: Attending: Internal Medicine | Admitting: Internal Medicine

## 2023-09-23 ENCOUNTER — Ambulatory Visit: Payer: 59 | Admitting: Internal Medicine

## 2023-09-23 VITALS — HR 82 | Ht 64.5 in | Wt 159.0 lb

## 2023-09-23 DIAGNOSIS — I4819 Other persistent atrial fibrillation: Secondary | ICD-10-CM

## 2023-09-23 DIAGNOSIS — I10 Essential (primary) hypertension: Secondary | ICD-10-CM | POA: Diagnosis not present

## 2023-09-23 NOTE — Progress Notes (Signed)
 Cardiology Office Note:    Date:  09/23/2023   ID:  Angela Dean, DOB 02-Apr-1956, MRN [id number]  PCP:  Bennie Pierini, FNP   [facility] Providers Cardiologist:  Maisie Fus, MD Electrophysiologist:  Regan Lemming, MD     Referring MD: Daphine Deutscher, Lakesia Dahle-Margaret, *   No chief complaint on file.  New diagnosis atrial fibrillation  History of Present Illness:    Angela Dean is a 68 y.o. female with a hx of atrial fibrillation CHADS2VASC 2 on aspirin, Thyrod nodule s/p thyroidectomy, breast cancer mastectomy on the L side, no radiation, no chemo  She notes diagnosis of atrial fibrillation. When resting after taking care of her mother , she feels palpitations. With activity she has no symptoms. She denies shortness of breath. She denies fatigue but was more fatigued  in June. She stopped taking aspirin thinking it was affecting. No bleeding. No syncope. She denies angina, dyspnea on exertion, lower extremity edema, PND or orthopnea. No smoking. Mother has atrial fibrillation. Father deceased in 10/07/10, no heart disease.No siblings with heart disease. Children no heart disease. She was diagnosed with thyroid dx 30 years ago with nodules. S/p removal. She is on synthroid.    11/29/2020 TSH 0.5 Crt 0.9 LDL 112, HDL 76, TC 201  Normal LFTs  ECG 08/23/2020- atrial fibrillation   Interim Hx: [name] was seen in afib clinic 05/26/2021 and she was noted to have diminished radial pulses. She stated this was long standing. No evidence of limb ischemia  She had BL arterial duplex of the upper extremities. She had > 50% stenosis in the RUE with subclavian artery obstruction, c/f possible thrombus. Flow was noted to be monophasic. Left brachial artery with noted obstruction  Plan was for CTA chest but underwent CT PE study at the ED in [place]. She was noted to have ascending aoritc aneurysm 4.0 cm. ).Minimal aortic arch calcifications. Multiple collateral vessels along the right  upper chest.  CT neck showed :  Few areas of right greater than left subclavian artery narrowing measuring up to 60%. Anomalous ligamentous bands are possible, but there is also a mildly beaded appearance of the cervical internal carotids suggesting an underlying vasculopathy (for example, fibromuscular dysplasia). Additionally, linear filling defect in the distal cervical ICA could reflect a superimposed dissection.  Called radiology, felt if dissection it was stable.   Echo notable for normal LV function. Mild LVH. Mild AI and MR. Noted to have bi-atrail enlargement. E/e' < 14. Normal RA pressure. No significant pulmonary htn  She continues to follow with afib clinic and was admitted for a tikosyn load. She had DCCV 07/20/2021. She converted to sinus bradycardia. Her Qtc was stable. She is maintained on Tikosyn  She comes in today for follow up. She denies palpitations. No LH, dizziness or fainting. She is asymptomatic.   Interim Hx 09/10/2022 She saw Dr. Edilia Bo, her sublclavian stenosis is stable as well as  right ICA dissection. She was started on lipitor and stopped 2/2 overall muscle pain  She's been followed in afib clinic. She had a ziopatch demonstrating 100% burden of afib. She is asymptomatic. She is s/p DCCV on 01/23/2022 with restoration of sinus rhythm with 1 shock at 150J. She saw Dr. Elberta Fortis and plan is for an ablation. She is s/p PVI 08/09/2022. Visit on 09/06/2022 with afib clinic, she was in sinus rhythm. No changes in he medications. Denies CP or SOB. She takes of her mother. Blood pressure is was controlled on  3/14  Interim hx 09/23/2023  She has done well since her PVI.  BP on the R challenging to detect, this is chronic. She denies any symptoms.  She is doing well.  Past Medical History:  Diagnosis Date   Atrial fibrillation (HCC)    Breast cancer (HCC)    Cancer (HCC)    Thyroid disease     Past Surgical History:  Procedure Laterality Date   ATRIAL FIBRILLATION  ABLATION N/A 08/09/2022   Procedure: ATRIAL FIBRILLATION ABLATION;  Surgeon: Regan Lemming, MD;  [facility] INVASIVE CV LAB;  Service: Cardiovascular;  Laterality: N/A;   CARDIOVERSION N/A 06/20/2021   Procedure: CARDIOVERSION;  Surgeon: Sande Rives, MD;  Location: [facility] ENDOSCOPY;  Service: Cardiovascular;  Laterality: N/A;   CARDIOVERSION N/A 07/20/2021   Procedure: CARDIOVERSION;  Surgeon: Lewayne Bunting, MD;  Location: [facility] ENDOSCOPY;  Service: Cardiovascular;  Laterality: N/A;   CARDIOVERSION N/A 01/23/2022   Procedure: CARDIOVERSION;  Surgeon: Thurmon Fair, MD;  Location: [facility] ENDOSCOPY;  Service: Cardiovascular;  Laterality: N/A;   MASTECTOMY Left 1997    Current Medications: Current Meds  Medication Sig   apixaban (ELIQUIS) 5 MG TABS tablet Take 1 tablet (5 mg total) by mouth 2 (two) times daily.   dofetilide (TIKOSYN) 250 MCG capsule Take 1 capsule (250 mcg total) by mouth 2 (two) times daily.   ezetimibe (ZETIA) 10 MG tablet Take 1 tablet (10 mg total) by mouth daily.   levothyroxine (SYNTHROID) 100 MCG tablet Take 1 tablet (100 mcg total) by mouth daily.   lisinopril (ZESTRIL) 10 MG tablet Take 1 tablet (10 mg total) by mouth daily.   metoprolol succinate (TOPROL-XL) 50 MG 24 hr tablet Take 1 tablet (50 mg total) by mouth 2 (two) times daily.     Allergies:   Patient has no known allergies.   Social History   Socioeconomic History   Marital status: Divorced    Spouse name: Not on file   Number of children: Not on file   Years of education: Not on file   Highest education level: Not on file  Occupational History   Not on file  Tobacco Use   Smoking status: Never   Smokeless tobacco: Never   Tobacco comments:    Never smoke 01/12/22  Substance and Sexual Activity   Alcohol use: No   Drug use: No   Sexual activity: Not on file  Other Topics Concern   Not on file  Social History Narrative   Not on file   Social Drivers of Health   Financial  Resource Strain: Not on file  Food Insecurity: Not on file  Transportation Needs: Not on file  Physical Activity: Not on file  Stress: Not on file  Social Connections: Not on file     Family History: The patient's family history includes Cancer in her sister, sister, and sister; Heart disease in her mother; Hypertension in her mother.  ROS:   Please see the history of present illness.     All other systems reviewed and are negative.  EKGs/Labs/Other Studies Reviewed:    The following studies were reviewed today:   EKG:  EKG is  ordered today.  The ekg ordered today demonstrates   Atrial Fibrillation rate 113. Qtc   Recent Labs: 03/25/2023: ALT 16; Hemoglobin 13.3; Platelets 226 06/21/2023: BUN 13; Creatinine, Ser 0.87; Magnesium 2.1; Potassium 4.4; Sodium 143  Recent Lipid Panel    Component Value Date/Time   CHOL 180 03/25/2023 1134  TRIG 63 03/25/2023 1134   TRIG 83 06/03/2014 1104   HDL 70 03/25/2023 1134   HDL 77 06/03/2014 1104   CHOLHDL 2.6 03/25/2023 1134   LDLCALC 98 03/25/2023 1134   LDLCALC 109 (H) 02/17/2013 1134     Risk Assessment/Calculations:     CHA2DS2-VASc Score = 3   This indicates a 3.2% annual risk of stroke. The patient's score is based upon: CHF History: 0 HTN History: 1 Diabetes History: 0 Stroke History: 0 Vascular Disease History: 0 Age Score: 1 Gender Score: 1       The 10-year ASCVD risk score (Arnett DK, et al., 2019) is: 9.2%   Values used to calculate the score:     Age: 68 years     Sex: Female     Is Non-Hispanic African American: No     Diabetic: No     Tobacco smoker: No     Systolic Blood Pressure: 128 mmHg     Is BP treated: Yes     HDL Cholesterol: 70 mg/dL     Total Cholesterol: 180 mg/dL   Physical Exam:    VS:       Wt Readings from Last 3 Encounters:  09/23/23 159 lb (72.1 kg)  06/21/23 138 lb 12.8 oz (63 kg)  03/25/23 159 lb (72.1 kg)     GEN:  Well nourished, well developed in no  acute distress HEENT: Normal NECK: No JVD; No carotid bruits LYMPHATICS: No lymphadenopathy CARDIAC: regular rhythm ,no murmurs, rubs, gallops Vasc: faint radial pulses  RESPIRATORY:  Clear to auscultation without rales, wheezing or rhonchi  ABDOMEN: Soft, non-tender, non-distended MUSCULOSKELETAL:  No edema; No deformity  SKIN: Warm and dry NEUROLOGIC:  Alert and oriented x 3 PSYCHIATRIC:  Normal affect   ASSESSMENT:    #Persistent Atrial Fibrillation: continue aspirin 81 mg daily with CHADS2VASC 2. She is asymptomatic. She has hx of hypothyroid/hyperthyroid. Echo shows normal LV function. bi-atrial enlargement, no significant valve dx. Underwent Tikosyn load completed 07/21/2021.  Did not maintain sinus rhythm, now s/p PVI - in sinus rhythm - continue Tikosyn 250 mcg BID - s/p PVI 08/09/2022 with Dr. Elberta Fortis; - continue eliquis - continue metop XL  Stable dissection R ICA dissection/R subclavian stenosis 11.2022:  She has evidence of aneurysm and stable dissection of the right ICA and beaded appearance of the cervical ICAs. She has obstructed flow of the R subclavian that is pre-occlusive. Her findings of suggestive of c/f vasculopathy or fibromuscular dysplasia; there was no strong suspicion of this by Dr. Edilia Bo. If having neurologic symptoms, can consider carotid duplex -continue metop per above -BP challenging on the R, on the right due to mastectomy on the left - did not tolerate statins, continue zetia 10 mg daily -evaluated by Dr. Edilia Bo - no indication for surgery without symptoms and stable dissection.;  Recommended to her that they can follow-up with vascular to see if there is a need for serial imaging  #HTN: well controlled on lisinopril 10 mg daily PLAN:    In order of problems listed above:   Follow up 6 months with APP/Recommend Dr. Servando Salina  Medication Adjustments/Labs and Tests Ordered: Current medicines are reviewed at length with the patient today.  Concerns  regarding medicines are outlined above.    Signed, Maisie Fus, MD  09/23/2023 2:21 PM    [facility]

## 2023-09-23 NOTE — Patient Instructions (Signed)
 Medication Instructions:  Your physician recommends that you continue on your current medications as directed. Please refer to the Current Medication list given to you today.  *If you need a refill on your cardiac medications before your next appointment, please call your pharmacy*  Follow-Up: At [facility], you and your health needs are our priority.  As part of our continuing mission to provide you with exceptional heart care, our providers are all part of one team.  This team includes your primary Cardiologist (physician) and Advanced Practice Providers or APPs (Physician Assistants and Nurse Practitioners) who all work together to provide you with the care you need, when you need it.  Your next appointment:   6 month(s)  Provider:   Any APP  Other Instructions    1st Floor: - Lobby - Registration  - Pharmacy  - Lab - Cafe  2nd Floor: - PV Lab - Diagnostic Testing (echo, CT, nuclear med)  3rd Floor: - Vacant  4th Floor: - TCTS (cardiothoracic surgery) - AFib Clinic - Structural Heart Clinic - Vascular Surgery  - Vascular Ultrasound  5th Floor: - HeartCare Cardiology (general and EP) - Clinical Pharmacy for coumadin, hypertension, lipid, weight-loss medications, and med management appointments    Valet parking services will be available as well.

## 2023-09-24 ENCOUNTER — Ambulatory Visit: Payer: Medicare Other | Admitting: Nurse Practitioner

## 2023-09-24 ENCOUNTER — Encounter: Payer: Self-pay | Admitting: Nurse Practitioner

## 2023-09-24 VITALS — BP 131/84 | HR 66 | Temp 98.2°F | Ht 64.0 in | Wt 157.0 lb

## 2023-09-24 DIAGNOSIS — E7800 Pure hypercholesterolemia, unspecified: Secondary | ICD-10-CM

## 2023-09-24 DIAGNOSIS — I10 Essential (primary) hypertension: Secondary | ICD-10-CM | POA: Diagnosis not present

## 2023-09-24 DIAGNOSIS — E039 Hypothyroidism, unspecified: Secondary | ICD-10-CM

## 2023-09-24 DIAGNOSIS — I4819 Other persistent atrial fibrillation: Secondary | ICD-10-CM | POA: Diagnosis not present

## 2023-09-24 LAB — LIPID PANEL

## 2023-09-24 MED ORDER — METOPROLOL SUCCINATE ER 50 MG PO TB24: 50.0000 mg | ORAL_TABLET | Freq: Two times a day (BID) | ORAL | 1 refills | Status: DC

## 2023-09-24 MED ORDER — DOFETILIDE 250 MCG PO CAPS: 250.0000 ug | ORAL_CAPSULE | Freq: Two times a day (BID) | ORAL | 10 refills | Status: DC

## 2023-09-24 MED ORDER — EZETIMIBE 10 MG PO TABS: 10.0000 mg | ORAL_TABLET | Freq: Every day | ORAL | 3 refills | Status: DC

## 2023-09-24 MED ORDER — LISINOPRIL 10 MG PO TABS: 10.0000 mg | ORAL_TABLET | Freq: Every day | ORAL | 1 refills | Status: DC

## 2023-09-24 MED ORDER — LEVOTHYROXINE SODIUM 100 MCG PO TABS: 100.0000 ug | ORAL_TABLET | Freq: Every day | ORAL | 1 refills | Status: DC

## 2023-09-24 MED ORDER — APIXABAN 5 MG PO TABS: 5.0000 mg | ORAL_TABLET | Freq: Two times a day (BID) | ORAL | 5 refills | Status: DC

## 2023-09-24 NOTE — Progress Notes (Signed)
 Subjective:    Patient ID: Angela Dean, female    DOB: 11-18-1955, 68 y.o.   MRN: [id number]   Chief Complaint: medical management of chronic issues     HPI:  Angela Dean is a 67 y.o. who identifies as a female who was assigned female at birth.   Social history: Lives with: with her husband Work history: retired   Water engineer in today for follow up of the following chronic medical issues:  1. Primary hypertension No c/o chest pain, sob or headache. Does not check blood pressure at home. BP Readings from Last 3 Encounters:  06/21/23 128/74  03/25/23 116/62  11/12/22 128/84    2. Pure hypercholesterolemia Does try  to watch diet and does occasional exercise. Lab Results  Component Value Date   CHOL 180 03/25/2023   HDL 70 03/25/2023   LDLCALC 98 03/25/2023   TRIG 63 03/25/2023   CHOLHDL 2.6 03/25/2023   The 10-year ASCVD risk score (Arnett DK, et al., 2019) is: 9.6%   3. Acquired hypothyroidism No problems that she is aware of. Lab Results  Component Value Date   TSH 0.582 03/20/2022     4. Persistent atrial fibrillation (HCC) No palpitations or heart racing. Is on eliquis with no bleeding issues.   New complaints: None today  No Known Allergies Outpatient Encounter Medications as of 09/24/2023  Medication Sig   apixaban (ELIQUIS) 5 MG TABS tablet Take 1 tablet (5 mg total) by mouth 2 (two) times daily.   dofetilide (TIKOSYN) 250 MCG capsule Take 1 capsule (250 mcg total) by mouth 2 (two) times daily.   ezetimibe (ZETIA) 10 MG tablet Take 1 tablet (10 mg total) by mouth daily.   levothyroxine (SYNTHROID) 100 MCG tablet Take 1 tablet (100 mcg total) by mouth daily.   lisinopril (ZESTRIL) 10 MG tablet Take 1 tablet (10 mg total) by mouth daily.   metoprolol succinate (TOPROL-XL) 50 MG 24 hr tablet Take 1 tablet (50 mg total) by mouth 2 (two) times daily.   No facility-administered encounter medications on file as of 09/24/2023.    Past Surgical History:   Procedure Laterality Date   ATRIAL FIBRILLATION ABLATION N/A 08/09/2022   Procedure: ATRIAL FIBRILLATION ABLATION;  Surgeon: Regan Lemming, MD;  Location: [facility] INVASIVE CV LAB;  Service: Cardiovascular;  Laterality: N/A;   CARDIOVERSION N/A 06/20/2021   Procedure: CARDIOVERSION;  Surgeon: Sande Rives, MD;  Location: [facility] ENDOSCOPY;  Service: Cardiovascular;  Laterality: N/A;   CARDIOVERSION N/A 07/20/2021   Procedure: CARDIOVERSION;  Surgeon: Lewayne Bunting, MD;  Location: [facility] ENDOSCOPY;  Service: Cardiovascular;  Laterality: N/A;   CARDIOVERSION N/A 01/23/2022   Procedure: CARDIOVERSION;  Surgeon: Thurmon Fair, MD;  Location: [facility] ENDOSCOPY;  Service: Cardiovascular;  Laterality: N/A;   MASTECTOMY Left 1997    Family History  Problem Relation Age of Onset   Heart disease Mother    Hypertension Mother    Cancer Sister    Cancer Sister    Cancer Sister       Controlled substance contract: n/a     Review of Systems  Constitutional:  Negative for diaphoresis.  Eyes:  Negative for pain.  Respiratory:  Negative for shortness of breath.   Cardiovascular:  Negative for chest pain, palpitations and leg swelling.  Gastrointestinal:  Negative for abdominal pain.  Endocrine: Negative for polydipsia.  Skin:  Negative for rash.  Neurological:  Negative for dizziness, weakness and headaches.  Hematological:  Does not bruise/bleed easily.  All  other systems reviewed and are negative.      Objective:   Physical Exam Vitals and nursing note reviewed.  Constitutional:      General: She is not in acute distress.    Appearance: Normal appearance. She is well-developed.  HENT:     Head: Normocephalic.     Right Ear: Tympanic membrane normal.     Left Ear: Tympanic membrane normal.     Nose: Nose normal.     Mouth/Throat:     Mouth: Mucous membranes are moist.  Eyes:     Pupils: Pupils are equal, round, and reactive to light.  Neck:     Vascular: No carotid bruit or  JVD.  Cardiovascular:     Rate and Rhythm: Normal rate. Rhythm irregular.     Heart sounds: Normal heart sounds.  Pulmonary:     Effort: Pulmonary effort is normal. No respiratory distress.     Breath sounds: Normal breath sounds. No wheezing or rales.  Chest:     Chest wall: No tenderness.  Abdominal:     General: Bowel sounds are normal. There is no distension or abdominal bruit.     Palpations: Abdomen is soft. There is no hepatomegaly, splenomegaly, mass or pulsatile mass.     Tenderness: There is no abdominal tenderness.  Musculoskeletal:        General: Normal range of motion.     Cervical back: Normal range of motion and neck supple.  Lymphadenopathy:     Cervical: No cervical adenopathy.  Skin:    General: Skin is warm and dry.  Neurological:     Mental Status: She is alert and oriented to person, place, and time.     Deep Tendon Reflexes: Reflexes are normal and symmetric.  Psychiatric:        Behavior: Behavior normal.        Thought Content: Thought content normal.        Judgment: Judgment normal.     BP 131/84   Pulse 66   Temp 98.2 F (36.8 C) (Temporal)   Ht 5\' 4"  (1.626 m)   Wt 157 lb (71.2 kg)   SpO2 97%   BMI 26.95 kg/m         Assessment & Plan:   Angela Dean comes in today with chief complaint of medical management of chronic issues    Diagnosis and orders addressed:  1. Primary hypertension Low sodium diet - lisinopril (ZESTRIL) 10 MG tablet; Take 1 tablet (10 mg total) by mouth daily.  Dispense: 90 tablet; Refill: 1 - CBC with Differential/Platelet - CMP14+EGFR  2. Pure hypercholesterolemia Low fat diet - rosuvastatin (CRESTOR) 10 MG tablet; Take 1 tablet (10 mg total) by mouth daily.  Dispense: 90 tablet; Refill: 1 - Lipid panel  3. Acquired hypothyroidism Labs pending - levothyroxine (SYNTHROID) 100 MCG tablet; TAKE ONE TABLET ONCE DAILY  Dispense: 90 tablet; Refill: 0 - Thyroid Panel With TSH  4. Persistent atrial  fibrillation (HCC) Avoid caffeine - apixaban (ELIQUIS) 5 MG TABS tablet; Take 1 tablet (5 mg total) by mouth 2 (two) times daily.  Dispense: 60 tablet; Refill: 5 - metoprolol succinate (TOPROL-XL) 50 MG 24 hr tablet; Take 1 tablet (50 mg total) by mouth 2 (two) times daily.  Dispense: 180 tablet; Refill: 1 - dofetilide (TIKOSYN) 250 MCG capsule; Take 1 capsule (250 mcg total) by mouth 2 (two) times daily.  Dispense: 60 capsule; Refill: 6   Labs pending Health Maintenance reviewed Diet and exercise encouraged  Follow up plan: 6 months   Mary-Margaret Daphine Deutscher, FNP

## 2023-09-24 NOTE — Patient Instructions (Signed)

## 2023-09-25 LAB — CMP14+EGFR
ALT: 13 IU/L (ref 0–32)
AST: 18 IU/L (ref 0–40)
Albumin: 4.1 g/dL (ref 3.9–4.9)
Alkaline Phosphatase: 50 IU/L (ref 44–121)
BUN/Creatinine Ratio: 18 (ref 12–28)
BUN: 14 mg/dL (ref 8–27)
Bilirubin Total: 0.5 mg/dL (ref 0.0–1.2)
CO2: 24 mmol/L (ref 20–29)
Calcium: 9.5 mg/dL (ref 8.7–10.3)
Chloride: 105 mmol/L (ref 96–106)
Creatinine, Ser: 0.8 mg/dL (ref 0.57–1.00)
Globulin, Total: 2.3 g/dL (ref 1.5–4.5)
Glucose: 87 mg/dL (ref 70–99)
Potassium: 4.6 mmol/L (ref 3.5–5.2)
Sodium: 140 mmol/L (ref 134–144)
Total Protein: 6.4 g/dL (ref 6.0–8.5)
eGFR: 80 mL/min/{1.73_m2} (ref >59)

## 2023-09-25 LAB — CBC WITH DIFFERENTIAL/PLATELET
Basophils Absolute: 0 10*3/uL (ref 0.0–0.2)
Basos: 1 %
EOS (ABSOLUTE): 0.1 10*3/uL (ref 0.0–0.4)
Eos: 2 %
Hematocrit: 42.3 % (ref 34.0–46.6)
Hemoglobin: 14 g/dL (ref 11.1–15.9)
Immature Grans (Abs): 0 10*3/uL (ref 0.0–0.1)
Immature Granulocytes: 0 %
Lymphocytes Absolute: 1.6 10*3/uL (ref 0.7–3.1)
Lymphs: 26 %
MCH: 29.4 pg (ref 26.6–33.0)
MCHC: 33.1 g/dL (ref 31.5–35.7)
MCV: 89 fL (ref 79–97)
Monocytes Absolute: 0.4 10*3/uL (ref 0.1–0.9)
Monocytes: 7 %
Neutrophils Absolute: 4 10*3/uL (ref 1.4–7.0)
Neutrophils: 64 %
Platelets: 228 10*3/uL (ref 150–450)
RBC: 4.77 x10E6/uL (ref 3.77–5.28)
RDW: 13.6 % (ref 11.7–15.4)
WBC: 6.2 10*3/uL (ref 3.4–10.8)

## 2023-09-25 LAB — LIPID PANEL
Cholesterol, Total: 159 mg/dL (ref 100–199)
HDL: 60 mg/dL (ref >39)
LDL CALC COMMENT:: 2.7 ratio (ref 0.0–4.4)
LDL Chol Calc (NIH): 86 mg/dL (ref 0–99)
Triglycerides: 65 mg/dL (ref 0–149)
VLDL Cholesterol Cal: 13 mg/dL (ref 5–40)

## 2023-10-10 LAB — COLOGUARD: COLOGUARD: NEGATIVE

## 2023-10-21 NOTE — Progress Notes (Unsigned)
 Cardiology Office Note:  .   Date:  10/22/2023  ID:  Angela Dean, DOB 07/25/1955, MRN [id number] PCP: Angela Feller, FNP  [facility] Providers Cardiologist:  Angela Campus, MD Electrophysiologist:  Angela Pump, MD {  History of Present Illness: Angela Dean is a 68 y.o. female w/PMHx of a thyroid  nodule (s/p thyroidectomy), breast ca (s/o L mastectomy), PVD (R subclavian artery stenosis/R ICA dissection), AFib  Saw Dr. Lawana Dean 11/12/22, doing well, no symptoms of AFib, no changes  I saw her 06/21/23 Uncertain if she would recognize Afib, though didn't think she had any Stable QTc Med list OK, planned for labs and 4 mo visit  She saw Dr. Amanda Dean 09/23/23, she was doing well, statin intolerant  Today's visit is scheduled as a 4 mo tikosyn  visit ROS:   She continues to feel well No palpitations or cardiac awareness No CP, SOB, DOE No near syncope or syncope No  neuro symptoms, no RUE pain/claudication   Arrhythmia/AAD hx AFib found 2022 Tikosyn  started Jan 2023 PVI ablation 08/09/22  Studies Reviewed: Angela Dean    EKG done today and reviewed by myself:  SR 65bpm, QTc 449  08/09/22: EPS/ablation CONCLUSIONS: 1. Atrial fibrillation upon presentation.   2. Successful electrical isolation and anatomical encircling of all four pulmonary veins with radiofrequency current.  A WACA approach was used 3. Atrial fibrillation successfully cardioverted to sinus rhythm. 4. No early apparent complications.  08/01/22: Cardiac CT IMPRESSION: 1.  Severe bi atrial enlargement 2.  No LAA thrombus 3.  Aneurysmal atrial septum with PFO 4.  Normal PV anatomy with right middle PV see measurements above 5.  Normal ascending thoracic aorta 3.7 cm 6.  No pericardial effusion 7.  Breast implants noted 8.  Calcium  score is 0   05/16/21: TTE 1. Left ventricular ejection fraction, by estimation, is 50% with beat to  beat variablity in afib. The left ventricle has low  normal function. The  left ventricle has no regional wall motion abnormalities. There is mild  left ventricular hypertrophy. Left   ventricular diastolic parameters are indeterminate.   2. Right ventricular systolic function is normal. The right ventricular  size is normal. There is normal pulmonary artery systolic pressure. The  estimated right ventricular systolic pressure is 27.8 mmHg.   3. Left atrial size was severely dilated.   4. Right atrial size was severely dilated.   5. The mitral valve is grossly normal. Mild mitral valve regurgitation.  No evidence of mitral stenosis.   6. The aortic valve is tricuspid. There is mild thickening of the aortic  valve. Aortic valve regurgitation is mild. No aortic stenosis is present.   7. The inferior vena cava is normal in size with greater than 50%  respiratory variability, suggesting right atrial pressure of 3 mmHg.    Risk Assessment/Calculations:    Physical Exam:   VS:  Pulse 65   Ht 5' 4.5" (1.638 m)   Wt 160 lb 12.8 oz (72.9 kg)   SpO2 94%   BMI 27.17 kg/m    Wt Readings from Last 3 Encounters:  10/22/23 160 lb 12.8 oz (72.9 kg)  09/24/23 157 lb (71.2 kg)  09/23/23 159 lb (72.1 kg)    GEN: Well nourished, well developed in no acute distress NECK: No JVD; No carotid bruits CARDIAC: RRR, no murmurs, rubs, gallops RESPIRATORY: CTA b/l without rales, wheezing or rhonchi  ABDOMEN: Soft, non-tender, non-distended EXTREMITIES: R  radial pulse is palpable, though  uneven from left, no cyanosis No edema; No deformity   ASSESSMENT AND PLAN: .    persistent AFib CHA2DS2Vasc is 3, on Eliquis , appropriately dosed no burden by symptoms Tikosyn , w/stable QTc Med list is OK Labs look ok, needs mag level  Secondary hypercoagulable state 2/2 AFib  3. PVD Right subclavian artery stenosis and R distal internal carotid dissection 4. HTN No claudication RUE BP is done on the RUE, she has had a mastectomy on the left so not done  left Manual BP 96/60 by myself She saw PMD a few weeks ago 131/84 No symptoms of hypotension, likely low 2/2 vascular disease In review of vascular note (2023) felt reasonable to use LUE for BP's I have asked her PMD to clairfy/confirm this rec. Dr. Shaunna Dean did not rec further w/u without symptoms (2023)    Dispo: back in 31mo, sooner if needed  Signed, Angela Fails, PA-C

## 2023-10-22 ENCOUNTER — Encounter: Payer: Self-pay | Admitting: Physician Assistant

## 2023-10-22 ENCOUNTER — Other Ambulatory Visit: Payer: Self-pay

## 2023-10-22 ENCOUNTER — Ambulatory Visit: Payer: 59 | Attending: Physician Assistant | Admitting: Physician Assistant

## 2023-10-22 VITALS — BP 96/60 | HR 65 | Ht 64.5 in | Wt 160.8 lb

## 2023-10-22 DIAGNOSIS — I4819 Other persistent atrial fibrillation: Secondary | ICD-10-CM

## 2023-10-22 DIAGNOSIS — I10 Essential (primary) hypertension: Secondary | ICD-10-CM

## 2023-10-22 DIAGNOSIS — I739 Peripheral vascular disease, unspecified: Secondary | ICD-10-CM

## 2023-10-22 DIAGNOSIS — D6869 Other thrombophilia: Secondary | ICD-10-CM

## 2023-10-22 DIAGNOSIS — Z5181 Encounter for therapeutic drug level monitoring: Secondary | ICD-10-CM | POA: Diagnosis not present

## 2023-10-22 DIAGNOSIS — Z79899 Other long term (current) drug therapy: Secondary | ICD-10-CM

## 2023-10-22 NOTE — Addendum Note (Signed)
 Addended by: CHAUVIGNE, Nica Friske on: 10/22/2023 03:25 PM   Modules accepted: Orders

## 2023-10-22 NOTE — Patient Instructions (Addendum)
 Medication Instructions:  Your physician recommends that you continue on your current medications as directed. Please refer to the Current Medication list given to you today.  *If you need a refill on your cardiac medications before your next appointment, please call your pharmacy*  Lab Work: TODAY: Mag If you have labs (blood work) drawn today and your tests are completely normal, you will receive your results only by: MyChart Message (if you have MyChart) OR A paper copy in the mail If you have any lab test that is abnormal or we need to change your treatment, we will call you to review the results.   Follow-Up: At [facility], you and your health needs are our priority.  As part of our continuing mission to provide you with exceptional heart care, our providers are all part of one team.  This team includes your primary Cardiologist (physician) and Advanced Practice Providers or APPs (Physician Assistants and Nurse Practitioners) who all work together to provide you with the care you need, when you need it.  Your next appointment:   4 months  Provider:   You will see one of the following Advanced Practice Providers on your designated Care Team:   Mertha Abrahams, Kennard Pea [address] [place], PA-C Suzann Riddle, NP Creighton Doffing, NP

## 2023-10-23 LAB — MAGNESIUM: Magnesium: 2.1 mg/dL (ref 1.6–2.3)

## 2024-02-24 NOTE — H&P (View-Only) (Signed)
 Cardiology Office Note:  .   Date:  02/24/2024  ID:  Angela Dean, DOB [birth year], MRN [id number] PCP: Gladis Mustard, FNP  [facility] Providers Cardiologist:  Alvan Ronal BRAVO, MD (Inactive) Electrophysiologist:  Will Gladis Norton, MD {  History of Present Illness: Angela Dean is a 68 y.o. female w/PMHx of a thyroid  nodule (s/p thyroidectomy), breast ca (s/o L mastectomy), PVD (R subclavian artery stenosis/R ICA dissection), AFib  Saw Dr. Norton 11/12/22, doing well, no symptoms of AFib, no changes  I saw her 06/21/23 Uncertain if she would recognize Afib, though didn't think she had any Stable QTc Med list OK, planned for labs and 4 mo visit  She saw Dr. Alvan 09/23/23, she was doing well, statin intolerant  I saw her 10/22/23 She continues to feel well No palpitations or cardiac awareness No CP, SOB, DOE No near syncope or syncope No  neuro symptoms, no RUE pain/claudication Stable EKG/QT, meds PVD RUE and mastectomy on Left side In review of vascular note (2023) felt reasonable to use LUE for BP's    Today's visit is scheduled as a 4 mo tikosyn  visit ROS:    She continues to feel well No palpitations or cardiac awareness and is very surprised to hear she is out of rhythm and going fast. She did some work in the yard over the weekend, felt well, did not think she overdid anything No CP, SOB, DOE No near syncope or syncope No  neuro symptoms, no RUE pain/claudication  She reports excellent medication compliance Specifically no missed tikosyn  or Eliquis  doses in the last 3 weeks if ever  Arrhythmia/AAD hx AFib found 2022 Tikosyn  started Jan 2023 PVI ablation 08/09/22  Studies Reviewed: Angela Dean    EKG done today and reviewed by myself:  Atypical AFlutter 135bpm, QT is difficult given rhythm/rate  08/09/22: EPS/ablation CONCLUSIONS: 1. Atrial fibrillation upon presentation.   2. Successful electrical isolation and anatomical encircling of all four  pulmonary veins with radiofrequency current.  A WACA approach was used 3. Atrial fibrillation successfully cardioverted to sinus rhythm. 4. No early apparent complications.  08/01/22: Cardiac CT IMPRESSION: 1.  Severe bi atrial enlargement 2.  No LAA thrombus 3.  Aneurysmal atrial septum with PFO 4.  Normal PV anatomy with right middle PV see measurements above 5.  Normal ascending thoracic aorta 3.7 cm 6.  No pericardial effusion 7.  Breast implants noted 8.  Calcium  score is 0   05/16/21: TTE 1. Left ventricular ejection fraction, by estimation, is 50% with beat to  beat variablity in afib. The left ventricle has low normal function. The  left ventricle has no regional wall motion abnormalities. There is mild  left ventricular hypertrophy. Left   ventricular diastolic parameters are indeterminate.   2. Right ventricular systolic function is normal. The right ventricular  size is normal. There is normal pulmonary artery systolic pressure. The  estimated right ventricular systolic pressure is 27.8 mmHg.   3. Left atrial size was severely dilated.   4. Right atrial size was severely dilated.   5. The mitral valve is grossly normal. Mild mitral valve regurgitation.  No evidence of mitral stenosis.   6. The aortic valve is tricuspid. There is mild thickening of the aortic  valve. Aortic valve regurgitation is mild. No aortic stenosis is present.   7. The inferior vena cava is normal in size with greater than 50%  respiratory variability, suggesting right atrial pressure of 3 mmHg.  Risk Assessment/Calculations:    Physical Exam:   VS:  There were no vitals taken for this visit.   Wt Readings from Last 3 Encounters:  10/22/23 160 lb 12.8 oz (72.9 kg)  09/24/23 157 lb (71.2 kg)  09/23/23 159 lb (72.1 kg)    GEN: Well nourished, well developed in no acute distress NECK: No JVD; No carotid bruits CARDIAC: irregular, tachycardic, no murmurs, rubs, gallops RESPIRATORY: CTA b/l  without rales, wheezing or rhonchi  ABDOMEN: Soft, non-tender, non-distended EXTREMITIES: No edema; No deformity   ASSESSMENT AND PLAN: .    persistent AFib Atypical atrial flutter CHA2DS2Vasc is 3, on Eliquis , appropriately dosed AFlutter today/RVR she is completely asymptomatic/unaware Tikosyn  Med list is OK  She does not really check HR/BP at home, unknown duration of episode Though the 1st known since her ablation last year Recommend DCCV > discussed procedure, potential risks/benefits > she is agreeable In the meantime increase metoprolol  to 100mg  AM and keep 50mg  PM  EKG reviewed with Dr. Inocencio, QT is difficult given rate/rhythm    Secondary hypercoagulable state 2/2 AFib  4.  PVD Right subclavian artery stenosis and R distal internal carotid dissection 4. HTN No claudication RUE In review of vascular note (2023) felt reasonable to use LUE for BP's I have asked her PMD to clairfy/confirm this rec. Dr. Eliza did not rec further w/u without symptoms (2023)  Informed Consent   Shared Decision Making/Informed Consent The risks (stroke, cardiac arrhythmias rarely resulting in the need for a temporary or permanent pacemaker, skin irritation or burns and complications associated with conscious sedation including aspiration, arrhythmia, respiratory failure and death), benefits (restoration of normal sinus rhythm) and alternatives of a direct current cardioversion were explained in detail to Angela Dean and she agrees to proceed.        Dispo: back in 3 weeks, sooner if needed  Signed, Charlies Macario Arthur, PA-C

## 2024-02-24 NOTE — Progress Notes (Unsigned)
 Cardiology Office Note:  .   Date:  02/24/2024  ID:  [name], DOB August 23, 1955, MRN [id number] PCP: Gladis Mustard, FNP  [facility] Providers Cardiologist:  Alvan Ronal BRAVO, MD (Inactive) Electrophysiologist:  Will Gladis Norton, MD {  History of Present Illness: Angela Dean is a 68 y.o. female w/PMHx of a thyroid  nodule (s/p thyroidectomy), breast ca (s/o L mastectomy), PVD (R subclavian artery stenosis/R ICA dissection), AFib  Saw Dr. Norton 11/12/22, doing well, no symptoms of AFib, no changes  I saw her 06/21/23 Uncertain if she would recognize Afib, though didn't think she had any Stable QTc Med list OK, planned for labs and 4 mo visit  She saw Dr. Alvan 09/23/23, she was doing well, statin intolerant  I saw her 10/22/23 She continues to feel well No palpitations or cardiac awareness No CP, SOB, DOE No near syncope or syncope No  neuro symptoms, no RUE pain/claudication Stable EKG/QT, meds PVD RUE and mastectomy on Left side In review of vascular note (2023) felt reasonable to use LUE for BP's    Today's visit is scheduled as a 4 mo tikosyn  visit ROS:   *** EKG, tikosyn  *** meds, labs *** burden  *** She continues to feel well No palpitations or cardiac awareness No CP, SOB, DOE No near syncope or syncope No  neuro symptoms, no RUE pain/claudication  Arrhythmia/AAD hx AFib found 2022 Tikosyn  started Jan 2023 PVI ablation 08/09/22  Studies Reviewed: Angela Dean    EKG done today and reviewed by myself:  ***  08/09/22: EPS/ablation CONCLUSIONS: 1. Atrial fibrillation upon presentation.   2. Successful electrical isolation and anatomical encircling of all four pulmonary veins with radiofrequency current.  A WACA approach was used 3. Atrial fibrillation successfully cardioverted to sinus rhythm. 4. No early apparent complications.  08/01/22: Cardiac CT IMPRESSION: 1.  Severe bi atrial enlargement 2.  No LAA thrombus 3.  Aneurysmal atrial  septum with PFO 4.  Normal PV anatomy with right middle PV see measurements above 5.  Normal ascending thoracic aorta 3.7 cm 6.  No pericardial effusion 7.  Breast implants noted 8.  Calcium  score is 0   05/16/21: TTE 1. Left ventricular ejection fraction, by estimation, is 50% with beat to  beat variablity in afib. The left ventricle has low normal function. The  left ventricle has no regional wall motion abnormalities. There is mild  left ventricular hypertrophy. Left   ventricular diastolic parameters are indeterminate.   2. Right ventricular systolic function is normal. The right ventricular  size is normal. There is normal pulmonary artery systolic pressure. The  estimated right ventricular systolic pressure is 27.8 mmHg.   3. Left atrial size was severely dilated.   4. Right atrial size was severely dilated.   5. The mitral valve is grossly normal. Mild mitral valve regurgitation.  No evidence of mitral stenosis.   6. The aortic valve is tricuspid. There is mild thickening of the aortic  valve. Aortic valve regurgitation is mild. No aortic stenosis is present.   7. The inferior vena cava is normal in size with greater than 50%  respiratory variability, suggesting right atrial pressure of 3 mmHg.    Risk Assessment/Calculations:    Physical Exam:   VS:  There were no vitals taken for this visit.   Wt Readings from Last 3 Encounters:  10/22/23 160 lb 12.8 oz (72.9 kg)  09/24/23 157 lb (71.2 kg)  09/23/23 159 lb (72.1 kg)  GEN: Well nourished, well developed in no acute distress NECK: No JVD; No carotid bruits CARDIAC: *** RRR, no murmurs, rubs, gallops RESPIRATORY: *** CTA b/l without rales, wheezing or rhonchi  ABDOMEN: Soft, non-tender, non-distended EXTREMITIES: *** R  radial pulse is palpable, though uneven from left, no cyanosis No edema; No deformity   ASSESSMENT AND PLAN: .    persistent AFib CHA2DS2Vasc is 3, on Eliquis , *** appropriately dosed *** no  burden by symptoms Tikosyn , w/*** stable QTc *** Med list is OK ***  Secondary hypercoagulable state 2/2 AFib  3. PVD Right subclavian artery stenosis and R distal internal carotid dissection 4. HTN No claudication RUE BP is done on the RUE, she has had a mastectomy on the left so not done left *** In review of vascular note (2023) felt reasonable to use LUE for BP's I have asked her PMD to clairfy/confirm this rec. Dr. Eliza did not rec further w/u without symptoms (2023)    Dispo: back in 49mo, sooner if needed  Signed, Charlies Macario Arthur, PA-C

## 2024-02-25 ENCOUNTER — Ambulatory Visit: Payer: Self-pay | Attending: Physician Assistant | Admitting: Physician Assistant

## 2024-02-25 ENCOUNTER — Encounter: Payer: Self-pay | Admitting: *Deleted

## 2024-02-25 VITALS — BP 124/75 | HR 135 | Ht 64.5 in | Wt 159.0 lb

## 2024-02-25 DIAGNOSIS — I4819 Other persistent atrial fibrillation: Secondary | ICD-10-CM | POA: Diagnosis not present

## 2024-02-25 DIAGNOSIS — Z79899 Other long term (current) drug therapy: Secondary | ICD-10-CM

## 2024-02-25 DIAGNOSIS — D6869 Other thrombophilia: Secondary | ICD-10-CM | POA: Diagnosis not present

## 2024-02-25 DIAGNOSIS — I484 Atypical atrial flutter: Secondary | ICD-10-CM | POA: Diagnosis not present

## 2024-02-25 DIAGNOSIS — Z5181 Encounter for therapeutic drug level monitoring: Secondary | ICD-10-CM | POA: Diagnosis not present

## 2024-02-25 MED ORDER — METOPROLOL SUCCINATE ER 50 MG PO TB24: ORAL_TABLET | ORAL | 2 refills | Status: DC

## 2024-02-25 NOTE — Patient Instructions (Addendum)
 Medication Instructions:   START TAKING:  METOPROLOL  100 MG  IN THE AM    START  TAKING:  METOPROLOL   50 MG IN THE PM    *If you need a refill on your cardiac medications before your next appointment, please call your pharmacy*   Lab Work:   PLEASE GO DOWN STAIRS  [facility]  FIRST FLOOR   ( GET OFF ELEVATORS WALK TOWARDS WAITING AREA LAB LOCATED BY PHARMACY): BMET  MAG AND CBC TODAY    If you have labs (blood work) drawn today and your tests are completely normal, you will receive your results only by: MyChart Message (if you have MyChart) OR A paper copy in the mail If you have any lab test that is abnormal or we need to change your treatment, we will call you to review the results.    Testing/Procedures: Your physician has recommended that you have a Cardioversion (DCCV). Electrical Cardioversion uses a jolt of electricity to your heart either through paddles or wired patches attached to your chest. This is a controlled, usually prescheduled, procedure. Defibrillation is done under light anesthesia in the hospital, and you usually go home the day of the procedure. This is done to get your heart back into a normal rhythm. You are not awake for the procedure. Please see the instruction sheet given to you today.   Follow-Up: At [facility], you and your health needs are our priority.  As part of our continuing mission to provide you with exceptional heart care, our providers are all part of one team.  This team includes your primary Cardiologist (physician) and Advanced Practice Providers or APPs (Physician Assistants and Nurse Practitioners) who all work together to provide you with the care you need, when you need it.  Your next appointment:  POST CARDIOVERSION  APPOINTMENT  3 week(s)   Provider:  Charlies Arthur, PA-C  ( CONTACT  CASSIE HALL/ ANGELINE HAMMER FOR EP SCHEDULING ISSUES )    We recommend signing up for the patient portal called MyChart.  Sign up information is  provided on this After Visit Summary.  MyChart is used to connect with patients for Virtual Visits (Telemedicine).  Patients are able to view lab/test results, encounter notes, upcoming appointments, etc.  Non-urgent messages can be sent to your provider as well.   To learn more about what you can do with MyChart, go to [email or web address].   Other Instructions

## 2024-02-26 LAB — CBC
Hematocrit: 43.4 % (ref 34.0–46.6)
Hemoglobin: 14.4 g/dL (ref 11.1–15.9)
MCH: 29.8 pg (ref 26.6–33.0)
MCHC: 33.2 g/dL (ref 31.5–35.7)
MCV: 90 fL (ref 79–97)
Platelets: 217 x10E3/uL (ref 150–450)
RBC: 4.84 x10E6/uL (ref 3.77–5.28)
RDW: 13.9 % (ref 11.7–15.4)
WBC: 7.3 x10E3/uL (ref 3.4–10.8)

## 2024-02-26 LAB — BASIC METABOLIC PANEL WITH GFR
BUN/Creatinine Ratio: 21 (ref 12–28)
BUN: 17 mg/dL (ref 8–27)
CO2: 23 mmol/L (ref 20–29)
Calcium: 9.7 mg/dL (ref 8.7–10.3)
Chloride: 104 mmol/L (ref 96–106)
Creatinine, Ser: 0.82 mg/dL (ref 0.57–1.00)
Glucose: 81 mg/dL (ref 70–99)
Potassium: 4.8 mmol/L (ref 3.5–5.2)
Sodium: 141 mmol/L (ref 134–144)
eGFR: 78 mL/min/1.73 (ref >59)

## 2024-02-26 LAB — MAGNESIUM: Magnesium: 2.1 mg/dL (ref 1.6–2.3)

## 2024-02-27 ENCOUNTER — Ambulatory Visit: Payer: Self-pay | Admitting: Physician Assistant

## 2024-02-27 NOTE — Progress Notes (Signed)
 Spoke to patient and instructed them to come at 0915  and to be NPO after 0000.     Confirmed that patient will have a ride home and someone to stay with them for 24 hours after the procedure.   Medications reviewed.  Confirmed blood thinner.  Confirmed no breaks in taking blood thinner for 3+ weeks prior to procedure.

## 2024-02-28 ENCOUNTER — Encounter (HOSPITAL_COMMUNITY)
Admission: RE | Disposition: A | Discharge status: Home / Self Care | Payer: Self-pay | Source: Home / Self Care | Attending: Cardiology

## 2024-02-28 ENCOUNTER — Ambulatory Visit (HOSPITAL_COMMUNITY)
Admission: RE | Admit: 2024-02-28 | Discharge: 2024-02-28 | Disposition: A | Discharge status: Home / Self Care | Attending: Cardiology | Admitting: Cardiology

## 2024-02-28 ENCOUNTER — Ambulatory Visit: Payer: Self-pay | Admitting: Nurse Practitioner

## 2024-02-28 ENCOUNTER — Ambulatory Visit (HOSPITAL_COMMUNITY)

## 2024-02-28 ENCOUNTER — Encounter: Payer: Self-pay | Admitting: Physician Assistant

## 2024-02-28 ENCOUNTER — Other Ambulatory Visit: Payer: Self-pay

## 2024-02-28 DIAGNOSIS — Z9012 Acquired absence of left breast and nipple: Secondary | ICD-10-CM | POA: Insufficient documentation

## 2024-02-28 DIAGNOSIS — I739 Peripheral vascular disease, unspecified: Secondary | ICD-10-CM | POA: Diagnosis not present

## 2024-02-28 DIAGNOSIS — I10 Essential (primary) hypertension: Secondary | ICD-10-CM | POA: Diagnosis not present

## 2024-02-28 DIAGNOSIS — I708 Atherosclerosis of other arteries: Secondary | ICD-10-CM | POA: Insufficient documentation

## 2024-02-28 DIAGNOSIS — I4892 Unspecified atrial flutter: Secondary | ICD-10-CM | POA: Diagnosis not present

## 2024-02-28 DIAGNOSIS — I484 Atypical atrial flutter: Secondary | ICD-10-CM | POA: Insufficient documentation

## 2024-02-28 DIAGNOSIS — E785 Hyperlipidemia, unspecified: Secondary | ICD-10-CM | POA: Diagnosis not present

## 2024-02-28 DIAGNOSIS — Z853 Personal history of malignant neoplasm of breast: Secondary | ICD-10-CM | POA: Diagnosis not present

## 2024-02-28 DIAGNOSIS — Z7901 Long term (current) use of anticoagulants: Secondary | ICD-10-CM | POA: Insufficient documentation

## 2024-02-28 DIAGNOSIS — D6869 Other thrombophilia: Secondary | ICD-10-CM | POA: Diagnosis not present

## 2024-02-28 DIAGNOSIS — I7771 Dissection of carotid artery: Secondary | ICD-10-CM | POA: Insufficient documentation

## 2024-02-28 DIAGNOSIS — E039 Hypothyroidism, unspecified: Secondary | ICD-10-CM | POA: Diagnosis not present

## 2024-02-28 DIAGNOSIS — I4819 Other persistent atrial fibrillation: Secondary | ICD-10-CM | POA: Diagnosis present

## 2024-02-28 HISTORY — PX: CARDIOVERSION: EP1203

## 2024-02-28 SURGERY — CARDIOVERSION (CATH LAB)
Anesthesia: General

## 2024-02-28 MED ORDER — SODIUM CHLORIDE 0.9% FLUSH
3.0000 mL | INTRAVENOUS | Status: DC | PRN
Start: 2024-02-28 — End: 2024-02-28
  Administered 2024-02-28: 3 mL via INTRAVENOUS

## 2024-02-28 MED ORDER — SODIUM CHLORIDE 0.9 % IV SOLN
250.0000 mL | INTRAVENOUS | Status: DC | PRN
Start: 2024-02-28 — End: 2024-02-28

## 2024-02-28 MED ORDER — SODIUM CHLORIDE 0.9% FLUSH: 3.0000 mL | Freq: Two times a day (BID) | INTRAVENOUS | Status: DC

## 2024-02-28 MED ORDER — PROPOFOL 10 MG/ML IV BOLUS
INTRAVENOUS | Status: DC | PRN
  Administered 2024-02-28: 60 mg via INTRAVENOUS

## 2024-02-28 MED ORDER — LIDOCAINE 2% (20 MG/ML) 5 ML SYRINGE
INTRAMUSCULAR | Status: DC | PRN
  Administered 2024-02-28: 80 mg via INTRAVENOUS

## 2024-02-28 SURGICAL SUPPLY — 1 items: PAD DEFIB RADIO PHYSIO CONN (PAD) ×2 IMPLANT

## 2024-02-28 NOTE — Anesthesia Postprocedure Evaluation (Signed)
 Anesthesia Post Note  Patient: Angela Dean  Procedure(s) Performed: CARDIOVERSION     Patient location during evaluation: Cath Lab Anesthesia Type: General Level of consciousness: awake and alert Pain management: pain level controlled Vital Signs Assessment: post-procedure vital signs reviewed and stable Respiratory status: spontaneous breathing, nonlabored ventilation, respiratory function stable and patient connected to nasal cannula oxygen Cardiovascular status: blood pressure returned to baseline and stable Postop Assessment: no apparent nausea or vomiting Anesthetic complications: no   No notable events documented.  Last Vitals:  Vitals:   02/28/24 1025 02/28/24 1035  BP: (!) 117/93 129/89  Pulse: (!) 57 80  Resp: 16 20  Temp:    SpO2: 98% 96%    Last Pain:  Vitals:   02/28/24 1035  TempSrc:   PainSc: 0-No pain                 Rome Ade

## 2024-02-28 NOTE — Interval H&P Note (Signed)
 History and Physical Interval Note:  02/28/2024 9:55 AM  Angela Dean  has presented today for surgery, with the diagnosis of AFIB.  The various methods of treatment have been discussed with the patient and family. After consideration of risks, benefits and other options for treatment, the patient has consented to  Procedure(s): CARDIOVERSION (N/A) as a surgical intervention.  The patient's history has been reviewed, patient examined, no change in status, stable for surgery.  I have reviewed the patient's chart and labs.  Questions were answered to the patient's satisfaction.     Wilbert Bihari

## 2024-02-28 NOTE — Telephone Encounter (Signed)
 error

## 2024-02-28 NOTE — CV Procedure (Signed)
    Electrical Cardioversion Procedure Note Angela Dean [id number] 1956/01/05  Procedure: Electrical Cardioversion Indications:  Atrial Flutter  Time Out: Verified patient identification, verified procedure,medications/allergies/relevent history reviewed, required imaging and test results available.  Performed  Procedure Details  During this procedure the patient is administered a total of Propofol  60 mg and Lidocaine  80 mg to achieve and maintain moderate conscious sedation.  The patient's heart rate, blood pressure, and oxygen saturation are monitored continuously during the procedure. The period of conscious sedation is 2 minutes, of which I was present face-to-face 100% of this time. Angela Hancock, CRNA is an independent, trained observer who assisted in the monitoring of the patient's level of consciousness.     Cardioversion was done with synchronized biphasic defibrillation with AP pads with 200watts.  The patient converted to normal sinus rhythm. The patient tolerated the procedure well   IMPRESSION:  Successful cardioversion of atrial flutter    Angela Dean 02/28/2024, 9:55 AM

## 2024-02-28 NOTE — Anesthesia Preprocedure Evaluation (Addendum)
 Anesthesia Evaluation  Patient identified by MRN, date of birth, ID band Patient awake    Reviewed: Allergy & Precautions, NPO status , Patient's Chart, lab work & pertinent test results  History of Anesthesia Complications Negative for: history of anesthetic complications  Airway Mallampati: II  TM Distance: >3 FB Neck ROM: Full    Dental no notable dental hx. (+) Teeth Intact, Partial Lower   Pulmonary neg pulmonary ROS, neg sleep apnea, neg COPD, Patient abstained from smoking.Not current smoker   Pulmonary exam normal breath sounds clear to auscultation       Cardiovascular Exercise Tolerance: Good METShypertension, Pt. on medications (-) CAD and (-) Past MI + dysrhythmias Atrial Fibrillation + Valvular Problems/Murmurs MR  Rhythm:Irregular Rate:Tachycardia - Systolic murmurs  1. Left ventricular ejection fraction, by estimation, is 50% with beat to  beat variablity in afib. The left ventricle has low normal function. The  left ventricle has no regional wall motion abnormalities. There is mild  left ventricular hypertrophy. Left   ventricular diastolic parameters are indeterminate.   2. Right ventricular systolic function is normal. The right ventricular  size is normal. There is normal pulmonary artery systolic pressure. The  estimated right ventricular systolic pressure is 27.8 mmHg.   3. Left atrial size was severely dilated.   4. Right atrial size was severely dilated.   5. The mitral valve is grossly normal. Mild mitral valve regurgitation.  No evidence of mitral stenosis.   6. The aortic valve is tricuspid. There is mild thickening of the aortic  valve. Aortic valve regurgitation is mild. No aortic stenosis is present.   7. The inferior vena cava is normal in size with greater than 50%  respiratory variability, suggesting right atrial pressure of 3 mmHg.     Neuro/Psych negative neurological ROS  negative psych ROS    GI/Hepatic ,neg GERD  ,,(+)     (-) substance abuse    Endo/Other  neg diabetesHypothyroidism    Renal/GU negative Renal ROS     Musculoskeletal   Abdominal   Peds  Hematology   Anesthesia Other Findings Past Medical History: No date: Atrial fibrillation (HCC) No date: Breast cancer (HCC) No date: Cancer (HCC) No date: Thyroid  disease  Reproductive/Obstetrics                              Anesthesia Physical Anesthesia Plan  ASA: 3  Anesthesia Plan: General   Post-op Pain Management: Minimal or no pain anticipated   Induction: Intravenous  PONV Risk Score and Plan: 3 and Propofol  infusion, TIVA and Ondansetron   Airway Management Planned: Nasal Cannula  Additional Equipment: None  Intra-op Plan:   Post-operative Plan:   Informed Consent: I have reviewed the patients History and Physical, chart, labs and discussed the procedure including the risks, benefits and alternatives for the proposed anesthesia with the patient or authorized representative who has indicated his/her understanding and acceptance.     Dental advisory given  Plan Discussed with: CRNA and Surgeon  Anesthesia Plan Comments: (Discussed risks of anesthesia with patient, including possibility of difficulty with spontaneous ventilation under anesthesia necessitating airway intervention, PONV, and rare risks such as cardiac or respiratory or neurological events, and allergic reactions. Discussed the role of CRNA in patient's perioperative care. Patient understands.)        Anesthesia Quick Evaluation

## 2024-02-28 NOTE — Discharge Instructions (Addendum)
 Electrical Cardioversion Electrical cardioversion is the delivery of a jolt of electricity to restore a normal rhythm to the heart. A rhythm that is too fast or is not regular keeps the heart from pumping well. In this procedure, sticky patches or metal paddles are placed on the chest to deliver electricity to the heart from a device. This procedure may be done in an emergency if: There is low or no blood pressure as a result of the heart rhythm. Normal rhythm must be restored as fast as possible to protect the brain and heart from further damage. It may save a life. This may also be a scheduled procedure for irregular or fast heart rhythms that are not immediately life-threatening.  What can I expect after the procedure? Your blood pressure, heart rate, breathing rate, and blood oxygen level will be monitored until you leave the hospital or clinic. Your heart rhythm will be watched to make sure it does not change. You may have some redness on the skin where the shocks were given. Over the counter cortizone cream may be helpful.  Follow these instructions at home: Do not drive for 24 hours if you were given a sedative during your procedure. Take over-the-counter and prescription medicines only as told by your health care provider. Ask your health care provider how to check your pulse. Check it often. Rest for 48 hours after the procedure or as told by your health care provider. Avoid or limit your caffeine use as told by your health care provider. Keep all follow-up visits as told by your health care provider. This is important. Contact a health care provider if: You feel like your heart is beating too quickly or your pulse is not regular. You have a serious muscle cramp that does not go away. Get help right away if: You have discomfort in your chest. You are dizzy or you feel faint. You have trouble breathing or you are short of breath. Your speech is slurred. You have trouble moving an  arm or leg on one side of your body. Your fingers or toes turn cold or blue. Summary Electrical cardioversion is the delivery of a jolt of electricity to restore a normal rhythm to the heart. This procedure may be done right away in an emergency or may be a scheduled procedure if the condition is not an emergency. Generally, this is a safe procedure. After the procedure, check your pulse often as told by your health care provider. This information is not intended to replace advice given to you by your health care provider. Make sure you discuss any questions you have with your health care provider. Document Revised: 01/12/2019 Document Reviewed: 01/12/2019 Elsevier Patient Education  2020 [employer].   [place] for monitoring of EKGs at home: You can look into the [facility] device by [employer].This device is purchased by you and it connects to an application you download to your smart phone.  It can detect abnormal heart rhythms and alert you to contact your doctor for further evaluation. The device is approximately $90 and the phone application is free.  The web site is:  [email or web address]://www.[employer].com

## 2024-02-28 NOTE — Transfer of Care (Signed)
 Immediate Anesthesia Transfer of Care Note  Patient: Angela Dean  Procedure(s) Performed: CARDIOVERSION  Patient Location: Cath Lab  Anesthesia Type:General  Level of Consciousness: awake, alert , oriented, and patient cooperative  Airway & Oxygen Therapy: Patient Spontanous Breathing and Patient connected to nasal cannula oxygen  Post-op Assessment: Report given to RN and Post -op Vital signs reviewed and stable  Post vital signs: Reviewed and stable  Last Vitals:  Vitals Value Taken Time  BP 89/72 02/28/24 10:15  Temp 36.5 C 02/28/24 10:15  Pulse 63 02/28/24 10:15  Resp 17 02/28/24 10:15  SpO2 95 % 02/28/24 10:15    Last Pain:  Vitals:   02/28/24 1015  TempSrc: Tympanic  PainSc: Asleep         Complications: No notable events documented.

## 2024-03-01 ENCOUNTER — Encounter (HOSPITAL_COMMUNITY): Payer: Self-pay | Admitting: Cardiology

## 2024-03-06 ENCOUNTER — Telehealth: Payer: Self-pay | Admitting: Physician Assistant

## 2024-03-06 ENCOUNTER — Telehealth: Payer: Self-pay | Admitting: *Deleted

## 2024-03-06 NOTE — Telephone Encounter (Signed)
 Lvm for patient documents have been faxed and confirmation time 11:54 am

## 2024-03-06 NOTE — Telephone Encounter (Signed)
 Pt callin in regards to Supplemental Coverage form she faxed to Nellie. She would like to make sure it has bee filled out, insurance is requesting it as soon as possible. Please advise.

## 2024-03-17 ENCOUNTER — Ambulatory Visit: Payer: Self-pay | Admitting: Physician Assistant

## 2024-03-19 ENCOUNTER — Telehealth: Payer: Self-pay | Admitting: Physician Assistant

## 2024-03-19 NOTE — Telephone Encounter (Signed)
 Pt states the insurance office did not receive the documents and requesting they be refaxed to Fax: [phone] ATTN:Jamie

## 2024-03-23 ENCOUNTER — Encounter: Payer: Self-pay | Admitting: Nurse Practitioner

## 2024-03-23 ENCOUNTER — Ambulatory Visit: Admitting: Nurse Practitioner

## 2024-03-23 VITALS — BP 133/96 | HR 152 | Temp 98.0°F | Ht 64.0 in | Wt 159.0 lb

## 2024-03-23 DIAGNOSIS — E039 Hypothyroidism, unspecified: Secondary | ICD-10-CM

## 2024-03-23 DIAGNOSIS — E7800 Pure hypercholesterolemia, unspecified: Secondary | ICD-10-CM | POA: Diagnosis not present

## 2024-03-23 DIAGNOSIS — I4819 Other persistent atrial fibrillation: Secondary | ICD-10-CM

## 2024-03-23 DIAGNOSIS — I10 Essential (primary) hypertension: Secondary | ICD-10-CM | POA: Diagnosis not present

## 2024-03-23 MED ORDER — APIXABAN 5 MG PO TABS: 5.0000 mg | ORAL_TABLET | Freq: Two times a day (BID) | ORAL | 5 refills | Status: AC

## 2024-03-23 MED ORDER — METOPROLOL SUCCINATE ER 50 MG PO TB24: ORAL_TABLET | ORAL | 2 refills | Status: DC

## 2024-03-23 MED ORDER — LEVOTHYROXINE SODIUM 100 MCG PO TABS: 100.0000 ug | ORAL_TABLET | Freq: Every day | ORAL | 1 refills | Status: AC

## 2024-03-23 MED ORDER — EZETIMIBE 10 MG PO TABS
10.0000 mg | ORAL_TABLET | Freq: Every day | ORAL | 3 refills | Status: AC
Start: 2024-03-23 — End: ?

## 2024-03-23 MED ORDER — DOFETILIDE 250 MCG PO CAPS: 250.0000 ug | ORAL_CAPSULE | Freq: Two times a day (BID) | ORAL | 10 refills | Status: DC

## 2024-03-23 MED ORDER — LISINOPRIL 10 MG PO TABS: 10.0000 mg | ORAL_TABLET | Freq: Every day | ORAL | 1 refills | Status: AC

## 2024-03-23 NOTE — Patient Instructions (Signed)
Atrial Fibrillation Atrial fibrillation (AFib) is a type of heartbeat that is irregular or fast. If you have AFib, your heart beats without any order. This makes it hard for your heart to pump blood in a normal way. AFib may come and go, or it may become a long-lasting problem. If AFib is not treated, it can put you at higher risk for stroke, heart failure, and other heart problems. What are the causes? AFib may be caused by diseases that damage the heart's electrical system. They include: High blood pressure. Heart failure. Heart valve diseases. Heart surgery. Diabetes. Thyroid disease. Kidney disease. Lung diseases, such as pneumonia or COPD. Sleep apnea. Sometimes the cause is not known. What increases the risk? You are more likely to develop AFib if: You are older. You exercise often and very hard. You have a family history of AFib. You are female. You are Caucasian. You are overweight. You smoke. You drink a lot of alcohol. What are the signs or symptoms? Common symptoms of this condition include: A feeling that your heart is beating very fast. Chest pain or discomfort. Feeling short of breath. Suddenly feeling light-headed or weak. Getting tired easily during activity. Fainting. Sweating. In some cases, there are no symptoms. How is this treated? Medicines to: Prevent blood clots. Treat heart rate or heart rhythm problems. Using devices, such as a pacemaker, to correct heart rhythm problems. Doing surgery to remove the part of the heart that sends bad signals. Closing an area where clots can form in the heart (left atrial appendage). In some cases, your doctor will treat other underlying conditions. Follow these instructions at home: Medicines Take over-the-counter and prescription medicines only as told by your doctor. Do not take any new medicines without first talking to your doctor. If you are taking blood thinners: Talk with your doctor before taking aspirin  or NSAIDs, such as ibuprofen. Take your medicines as told. Take them at the same time each day. Do not do things that could hurt or bruise you. Be careful to avoid falls. Wear an alert bracelet or carry a card that says you take blood thinners. Lifestyle Do not smoke or use any products that contain nicotine or tobacco. If you need help quitting, ask your doctor. Eat heart-healthy foods. Talk with your doctor about the right eating plan for you. Exercise regularly as told by your doctor. Do not drink alcohol. Lose weight if you are overweight. General instructions If you have sleep apnea, treat it as told by your doctor. Do not use diet pills unless your doctor says they are safe for you. Diet pills may make heart problems worse. Keep all follow-up visits. Your doctor will check your heart rate and rhythm regularly. Contact a doctor if: You notice a change in the speed, rhythm, or strength of your heartbeat. You are taking a blood-thinning medicine and you get more bruising. You get tired more easily when you move or exercise. You have a sudden change in weight. Get help right away if:  You have pain in your chest. You have trouble breathing. You have side effects of blood thinners, such as blood in your vomit, poop (stool), or pee (urine), or bleeding that cannot stop. You have any signs of a stroke. "BE FAST" is an easy way to remember the main warning signs: B - Balance. Dizziness, sudden trouble walking, or loss of balance. E - Eyes. Trouble seeing or a change in how you see. F - Face. Sudden weakness or loss of  feeling in the face. The face or eyelid may droop on one side. A - Arms.Weakness or loss of feeling in an arm. This happens suddenly and usually on one side of the body. S - Speech. Sudden trouble speaking, slurred speech, or trouble understanding what people say. T - Time.Time to call emergency services. Write down what time symptoms started. You have other signs of a  stroke, such as: A sudden, very bad headache with no known cause. Feeling like you may vomit (nausea). Vomiting. A seizure. These symptoms may be an emergency. Get help right away. Call 911. Do not wait to see if the symptoms will go away. Do not drive yourself to the hospital. This information is not intended to replace advice given to you by your health care provider. Make sure you discuss any questions you have with your health care provider. Document Revised: 02/28/2022 Document Reviewed: 02/28/2022 Elsevier Patient Education  2024 [employer].

## 2024-03-23 NOTE — Progress Notes (Signed)
 Subjective:    Patient ID: Angela Dean, female    DOB: 04-17-1956, 68 y.o.   MRN: [id number]   Chief Complaint: medical management of chronic issues     HPI:  Angela Dean is a 68 y.o. who identifies as a female who was assigned female at birth.   Social history: Lives with: with her husband Work history: retired   Water engineer in today for follow up of the following chronic medical issues:  1. Primary hypertension No c/o chest pain, sob or headache. Does not check blood pressure at home. BP Readings from Last 3 Encounters:  02/28/24 129/89  02/25/24 124/75  10/22/23 96/60    2. Pure hypercholesterolemia Does try  to watch diet and does occasional exercise. Lab Results  Component Value Date   CHOL 159 09/24/2023   HDL 60 09/24/2023   LDLCALC 86 09/24/2023   TRIG 65 09/24/2023   CHOLHDL 2.7 09/24/2023   The 10-year ASCVD risk score (Arnett DK, et al., 2019) is: 9.3%   3. Acquired hypothyroidism No problems that she is aware of. Lab Results  Component Value Date   TSH 0.582 03/20/2022     4. Persistent atrial fibrillation (HCC) Had cardioversion 02/28/24. Since then her heart rate has been elevated. Ranging from 120-150. She denis SOB and says she feels good. Still on eliquis  daily with no issues.    New complaints: None today  No Known Allergies Outpatient Encounter Medications as of 03/23/2024  Medication Sig   apixaban  (ELIQUIS ) 5 MG TABS tablet Take 1 tablet (5 mg total) by mouth 2 (two) times daily.   dofetilide  (TIKOSYN ) 250 MCG capsule Take 1 capsule (250 mcg total) by mouth 2 (two) times daily.   ezetimibe  (ZETIA ) 10 MG tablet Take 1 tablet (10 mg total) by mouth daily.   levothyroxine  (SYNTHROID ) 100 MCG tablet Take 1 tablet (100 mcg total) by mouth daily.   lisinopril  (ZESTRIL ) 10 MG tablet Take 1 tablet (10 mg total) by mouth daily.   metoprolol  succinate (TOPROL -XL) 50 MG 24 hr tablet Take 100 mg in the am take 50 mg in the pm   [DISCONTINUED]  metoprolol  succinate (TOPROL -XL) 50 MG 24 hr tablet Take 1 tablet (50 mg total) by mouth 2 (two) times daily.   No facility-administered encounter medications on file as of 03/23/2024.    Past Surgical History:  Procedure Laterality Date   ATRIAL FIBRILLATION ABLATION N/A 08/09/2022   Procedure: ATRIAL FIBRILLATION ABLATION;  Surgeon: Inocencio Soyla Lunger, MD;  Location: [facility] INVASIVE CV LAB;  Service: Cardiovascular;  Laterality: N/A;   CARDIOVERSION N/A 06/20/2021   Procedure: CARDIOVERSION;  Surgeon: Barbaraann Darryle Ned, MD;  Location: [facility] ENDOSCOPY;  Service: Cardiovascular;  Laterality: N/A;   CARDIOVERSION N/A 07/20/2021   Procedure: CARDIOVERSION;  Surgeon: Pietro Redell RAMAN, MD;  Location: [facility] ENDOSCOPY;  Service: Cardiovascular;  Laterality: N/A;   CARDIOVERSION N/A 01/23/2022   Procedure: CARDIOVERSION;  Surgeon: Francyne Headland, MD;  Location: [facility] ENDOSCOPY;  Service: Cardiovascular;  Laterality: N/A;   CARDIOVERSION N/A 02/28/2024   Procedure: CARDIOVERSION;  Surgeon: Shlomo Wilbert JONELLE, MD;  Location: [facility] INVASIVE CV LAB;  Service: Cardiovascular;  Laterality: N/A;   MASTECTOMY Left 1997    Family History  Problem Relation Age of Onset   Heart disease Mother    Hypertension Mother    Cancer Sister    Cancer Sister    Cancer Sister       Controlled substance contract: n/a     Review of Systems  Constitutional:  Negative for diaphoresis.  Eyes:  Negative for pain.  Respiratory:  Negative for shortness of breath.   Cardiovascular:  Negative for chest pain, palpitations and leg swelling.  Gastrointestinal:  Negative for abdominal pain.  Endocrine: Negative for polydipsia.  Skin:  Negative for rash.  Neurological:  Negative for dizziness, weakness and headaches.  Hematological:  Does not bruise/bleed easily.  All other systems reviewed and are negative.      Objective:   Physical Exam Vitals and nursing note reviewed.  Constitutional:      General: She is not in acute  distress.    Appearance: Normal appearance. She is well-developed.  HENT:     Head: Normocephalic.     Right Ear: Tympanic membrane normal.     Left Ear: Tympanic membrane normal.     Nose: Nose normal.     Mouth/Throat:     Mouth: Mucous membranes are moist.  Eyes:     Pupils: Pupils are equal, round, and reactive to light.  Neck:     Vascular: No carotid bruit or JVD.  Cardiovascular:     Rate and Rhythm: Tachycardia present. Rhythm irregular.     Heart sounds: Normal heart sounds.  Pulmonary:     Effort: Pulmonary effort is normal. No respiratory distress.     Breath sounds: Normal breath sounds. No wheezing or rales.  Chest:     Chest wall: No tenderness.  Abdominal:     General: Bowel sounds are normal. There is no distension or abdominal bruit.     Palpations: Abdomen is soft. There is no hepatomegaly, splenomegaly, mass or pulsatile mass.     Tenderness: There is no abdominal tenderness.  Musculoskeletal:        General: Normal range of motion.     Cervical back: Normal range of motion and neck supple.  Lymphadenopathy:     Cervical: No cervical adenopathy.  Skin:    General: Skin is warm and dry.  Neurological:     Mental Status: She is alert and oriented to person, place, and time.     Deep Tendon Reflexes: Reflexes are normal and symmetric.  Psychiatric:        Behavior: Behavior normal.        Thought Content: Thought content normal.        Judgment: Judgment normal.     BP (!) 133/96   Pulse (!) 152   Temp 98 F (36.7 C) (Temporal)   Ht 5' 4 (1.626 m)   Wt 159 lb (72.1 kg)   SpO2 95%   BMI 27.29 kg/m          Assessment & Plan:   Angela Dean comes in today with chief complaint of medical management of chronic issues    Diagnosis and orders addressed:  1. Primary hypertension Low sodium diet - lisinopril  (ZESTRIL ) 10 MG tablet; Take 1 tablet (10 mg total) by mouth daily.  Dispense: 90 tablet; Refill: 1 - CBC with Differential/Platelet -  CMP14+EGFR  2. Pure hypercholesterolemia Low fat diet - rosuvastatin  (CRESTOR ) 10 MG tablet; Take 1 tablet (10 mg total) by mouth daily.  Dispense: 90 tablet; Refill: 1 - Lipid panel  3. Acquired hypothyroidism Labs pending - levothyroxine  (SYNTHROID ) 100 MCG tablet; TAKE ONE TABLET ONCE DAILY  Dispense: 90 tablet; Refill: 0 - Thyroid  Panel With TSH  4. Persistent atrial fibrillation (HCC) Avoid caffeine Message sent to cardiology If develop sob go to ED - apixaban  (ELIQUIS ) 5 MG TABS tablet;  Take 1 tablet (5 mg total) by mouth 2 (two) times daily.  Dispense: 60 tablet; Refill: 5 - metoprolol  succinate (TOPROL -XL) 50 MG 24 hr tablet; Take 1 tablet (50 mg total) by mouth 2 (two) times daily.  Dispense: 180 tablet; Refill: 1 - dofetilide  (TIKOSYN ) 250 MCG capsule; Take 1 capsule (250 mcg total) by mouth 2 (two) times daily.  Dispense: 60 capsule; Refill: 6   Labs pending Health Maintenance reviewed Diet and exercise encouraged  Follow up plan: 6 months   Mary-Margaret Gladis, FNP

## 2024-03-24 ENCOUNTER — Ambulatory Visit: Payer: Self-pay | Admitting: Nurse Practitioner

## 2024-03-24 LAB — THYROID PANEL WITH TSH
Free Thyroxine Index: 3.5 (ref 1.2–4.9)
T3 Uptake Ratio: 33 % (ref 24–39)
T4, Total: 10.6 ug/dL (ref 4.5–12.0)
TSH: 0.46 u[IU]/mL (ref 0.450–4.500)

## 2024-03-24 LAB — BMP8+EGFR
BUN/Creatinine Ratio: 18 (ref 12–28)
BUN: 14 mg/dL (ref 8–27)
CO2: 21 mmol/L (ref 20–29)
Calcium: 9.5 mg/dL (ref 8.7–10.3)
Chloride: 107 mmol/L — ABNORMAL HIGH (ref 96–106)
Creatinine, Ser: 0.8 mg/dL (ref 0.57–1.00)
Glucose: 96 mg/dL (ref 70–99)
Potassium: 4.5 mmol/L (ref 3.5–5.2)
Sodium: 142 mmol/L (ref 134–144)
eGFR: 80 mL/min/1.73 (ref >59)

## 2024-03-25 ENCOUNTER — Ambulatory Visit (HOSPITAL_COMMUNITY)
Admission: RE | Admit: 2024-03-25 | Discharge: 2024-03-25 | Disposition: A | Discharge status: Home / Self Care | Source: Ambulatory Visit | Attending: Physician Assistant | Admitting: Physician Assistant

## 2024-03-25 VITALS — BP 142/96 | HR 127 | Ht 64.0 in | Wt 160.2 lb

## 2024-03-25 DIAGNOSIS — I4819 Other persistent atrial fibrillation: Secondary | ICD-10-CM

## 2024-03-25 DIAGNOSIS — Z5181 Encounter for therapeutic drug level monitoring: Secondary | ICD-10-CM | POA: Diagnosis not present

## 2024-03-25 DIAGNOSIS — I4891 Unspecified atrial fibrillation: Secondary | ICD-10-CM | POA: Diagnosis not present

## 2024-03-25 DIAGNOSIS — Z79899 Other long term (current) drug therapy: Secondary | ICD-10-CM | POA: Diagnosis not present

## 2024-03-25 DIAGNOSIS — D6869 Other thrombophilia: Secondary | ICD-10-CM

## 2024-03-25 MED ORDER — METOPROLOL SUCCINATE ER 50 MG PO TB24: 100.0000 mg | ORAL_TABLET | Freq: Two times a day (BID) | ORAL | 2 refills | Status: DC

## 2024-03-25 NOTE — Progress Notes (Signed)
 Primary Care Physician: Gladis Mustard, FNP Referring Physician:Dr. Ronal Ross Primary EP: Dr Inocencio Nathanel Angela Dean is a 68 y.o. female  with a hx of atrial fibrillation CHADS2VASC 2 on aspirin, Thyrod nodule s/p thyroidectomy, breast cancer mastectomy on the L side, no radiation, no chemo   She is in the afib clinic for evaluation after being seen by Dr. Ross 10/31 for afib. She has RVR today at 115 bpm. She is asymptomatic. It appears that she is persistent. I discussed with pt to pursue restoring SR, it will likely mean pursing cardioversion and for this she will need to be on anticoagulation for at least 21 days prior to this. She does not drink alcohol, smoke, or snore.   F/u in afib clinic, 06/02/21. She remains in rate controlled afib. We discussed risk vrs benefit of pursing cardioversion and she would like to pursue. She did have a doppler for decreased blood flow to rt arm and by doppler was found to have an obstruction. She was directed to the ER for CT and the report read  rt subclavian may be severely stenotic or occluded( age indeterminate) but pt states her BP has not been audible in her rt arm for some time. She had her BB increased in the ER for elevated BP and was told to continue her eliquis .   We discussed pursing CV, risk vrs benefit  and she is in agreement. No missed anticoagulation since starting drug and the cardioversion are running almost 3 weeks out. Recent echo showed low normal EF with bilateral atria enlargement.  Follow up in the AF clinic 06/28/21. Patient is s/p DCCV on 06/20/21 which was unsuccessful after 3 attempts. Per procedure report, no sinus beats after any of the attempts. She is in rate controlled afib today. Remains asymptomatic.   Follow up in the AF clinic 07/18/21. Patient presents for dofetilide  admission. She denies any missed doses of anticoagulation.   F/u 2/223. She is now one week out from Tikosyn  loading and is in SR. She has had some  back pain since hospital stay, she feels from sitting and lying in bed so much. She is taking Tylenol  for it and it is more positional in nature.   F/u in afib clinic, 12/07/21. She is in afib today, rate controlled. She is unaware of when she is in rhythm or in afib. She continues on Tikosyn  250 mcg bid.  BP is elevated today. Rechecked 160/104. Pt states under much stress taking care of her mother. BP usually does not run that high. BP has to be taken on left arm as she has a rt subclavian artery stenosis  Follow up in the AF clinic 01/12/22. Patient remains in persistent afib today. Zio patch showed 100% afib burden. She is completely unaware of her arrhythmia and feels better than I have in years.   Follow up in the AF clinic 09/06/22. Patient had DCCV on 01/23/22 but was back in afib at her follow up with Dr Inocencio on 03/28/22. She underwent afib ablation with Dr Inocencio on 08/09/22. Patient is in SR today. She does feel that her fatigue has improved. She denies chest pain, swallowing pain, or groin issues.   Follow up 03/25/24. Patient returns for follow up for atrial fibrillation and dofetilide  monitoring. Patient is s/p DCCV on 02/28/24 but has had quick return of afib. She remains in afib today with elevated rates but is unaware of her arrhythmia. There were no specific triggers for her afib  that she could identify. No bleeding issues on anticoagulation.   Today, she  denies symptoms of palpitations, chest pain, shortness of breath, orthopnea, PND, lower extremity edema, dizziness, presyncope, syncope, snoring, daytime somnolence, bleeding, or neurologic sequela. The patient is tolerating medications without difficulties and is otherwise without complaint today.    Past Medical History:  Diagnosis Date   Atrial fibrillation (HCC)    Breast cancer (HCC)    Cancer (HCC)    Thyroid  disease     Current Outpatient Medications  Medication Sig Dispense Refill   apixaban  (ELIQUIS ) 5 MG TABS tablet  Take 1 tablet (5 mg total) by mouth 2 (two) times daily. 60 tablet 5   dofetilide  (TIKOSYN ) 250 MCG capsule Take 1 capsule (250 mcg total) by mouth 2 (two) times daily. 60 capsule 10   ezetimibe  (ZETIA ) 10 MG tablet Take 1 tablet (10 mg total) by mouth daily. 90 tablet 3   levothyroxine  (SYNTHROID ) 100 MCG tablet Take 1 tablet (100 mcg total) by mouth daily. 90 tablet 1   lisinopril  (ZESTRIL ) 10 MG tablet Take 1 tablet (10 mg total) by mouth daily. 90 tablet 1   metoprolol  succinate (TOPROL -XL) 50 MG 24 hr tablet Take 100 mg in the am take 50 mg in the pm 270 tablet 2   No current facility-administered medications for this encounter.    ROS- All systems are reviewed and negative except as per the HPI above  Physical Exam: Vitals:   03/25/24 1006  BP: (!) 142/96  Pulse: (!) 127  Weight: 72.7 kg  Height: 5' 4 (1.626 m)     Wt Readings from Last 3 Encounters:  03/25/24 72.7 kg  03/23/24 72.1 kg  02/25/24 72.1 kg    GEN: Well nourished, well developed in no acute distress CARDIAC: Irregularly irregular rate and rhythm, no murmurs, rubs, gallops RESPIRATORY:  Clear to auscultation without rales, wheezing or rhonchi  ABDOMEN: Soft, non-tender, non-distended EXTREMITIES:  No edema; No deformity    ECG today demonstrates Afib with RVR Vent. rate 127 BPM PR interval * ms QRS duration 70 ms QT/QTcB 326/473 ms   Echo-  05/16/2021 1. Left ventricular ejection fraction, by estimation, is 50% with beat to beat variablity in afib. The left ventricle has low normal function. The left ventricle has no regional wall motion abnormalities. There is mild left ventricular hypertrophy. Left  ventricular diastolic parameters are indeterminate.   2. Right ventricular systolic function is normal. The right ventricular size is normal. There is normal pulmonary artery systolic pressure. The estimated right ventricular systolic pressure is 27.8 mmHg.   3. Left atrial size was severely dilated.    4. Right atrial size was severely dilated.   5. The mitral valve is grossly normal. Mild mitral valve regurgitation. No evidence of mitral stenosis.   6. The aortic valve is tricuspid. There is mild thickening of the aortic valve. Aortic valve regurgitation is mild. No aortic stenosis is present.   7. The inferior vena cava is normal in size with greater than 50% respiratory variability, suggesting right atrial pressure of 3 mmHg.    CHA2DS2-VASc Score = 3  The patient's score is based upon: CHF History: 0 HTN History: 1 Diabetes History: 0 Stroke History: 0 Vascular Disease History: 0 Age Score: 1 Gender Score: 1       ASSESSMENT AND PLAN: Persistent Atrial Fibrillation (ICD10:  I48.19) The patient's CHA2DS2-VASc score is 3, indicating a 3.2% annual risk of stroke.   S/p dofetilide  admission 06/2021 S/p  afib ablation 08/09/22 S/p DCCV 02/28/24 with quick return of afib.  We discussed rhythm control options today. Long term, she would like to discuss repeat ablation with Dr Inocencio, will refer back to him. Short term, would like to keep her on dofetilide  if possible. Will increase Toprol  to 100 mg BID. However, if rate control proves difficult, may need to transition to amiodarone + DCCV as a bridge to ablation.  Continue Eliquis  5 mg BID Increase Toprol  to 100 mg BID  Secondary Hypercoagulable State (ICD10:  D68.69) The patient is at significant risk for stroke/thromboembolism based upon her CHA2DS2-VASc Score of 3.  Continue Apixaban  (Eliquis ). No bleeding issues.   High Risk Medication Monitoring (ICD 10: [id number]) Patient requires ongoing monitoring for anti-arrhythmic medication which has the potential to cause life threatening arrhythmias. QT interval on ECG acceptable for dofetilide  monitoring.   HTN Stable on current regimen   Follow up with Charlies Arthur as scheduled to reassess rate control and then with Dr Inocencio to discuss ablation.     Daril Kicks PA-C Afib  Clinic [facility] [address] [place], [place] [address] [phone]

## 2024-03-25 NOTE — Patient Instructions (Signed)
Increase metoprolol to 100 mg twice a day.   

## 2024-03-31 NOTE — Progress Notes (Unsigned)
 Cardiology Office Note:  .   Date:  03/31/2024  ID:  Angela Dean, DOB 09-23-1955, MRN [id number] PCP: Gladis Mustard, FNP  [facility] Providers Cardiologist:  Alvan Ronal BRAVO, MD (Inactive) Electrophysiologist:  Will Gladis Norton, MD {  History of Present Illness: Angela Dean is a 68 y.o. female w/PMHx of a thyroid  nodule (s/p thyroidectomy), breast ca (s/o L mastectomy), PVD (R subclavian artery stenosis/R ICA dissection), AFib  Saw Dr. Norton 11/12/22, doing well, no symptoms of AFib, no changes  I saw her 06/21/23 Uncertain if she would recognize Afib, though didn't think she had any Stable QTc Med list OK, planned for labs and 4 mo visit  She saw Dr. Alvan 09/23/23, she was doing well, statin intolerant  I saw her 10/22/23 She continues to feel well No palpitations or cardiac awareness No CP, SOB, DOE No near syncope or syncope No  neuro symptoms, no RUE pain/claudication Stable EKG/QT, meds PVD RUE and mastectomy on Left side In review of vascular note (2023) felt reasonable to use LUE for BP's   I saw her 02/25/24 She continues to feel well No palpitations or cardiac awareness and is very surprised to hear she is out of rhythm and going fast. She did some work in the yard over the weekend, felt well, did not think she overdid anything No CP, SOB, DOE No near syncope or syncope No  neuro symptoms, no RUE pain/claudication She reports excellent medication compliance Specifically no missed tikosyn  or Eliquis  doses in the last 3 weeks if ever She was in Afib w/RVR, the 1st KNOWN episode post ablation She was unaware of the AFib/rate. Metoprolol  increased slightly, planned for DCCV  DCCV 02/28/24 successful  ERAF noted by her Primary provider 03/23/24  AFib clinic visit 03/25/24, again, without symptoms, awareness.  She preferred ablation (repeat) as a management strategy, Toprol  increased Discussed possible failure of Tikosyn , with mention of  possible need for amio as a bridge to ablation  Today's visit is scheduled as a 3 week post DCCV visit ROS:   *** rate *** symptoms *** eliquis , dose, bleeding, labs *** ablate? *** Tikosyn  EKG >. Amio?   Arrhythmia/AAD hx AFib found 2022 Tikosyn  started Jan 2023 PVI ablation 08/09/22  Studies Reviewed: [name]    EKG done today and reviewed by myself:  ***  08/09/22: EPS/ablation CONCLUSIONS: 1. Atrial fibrillation upon presentation.   2. Successful electrical isolation and anatomical encircling of all four pulmonary veins with radiofrequency current.  A WACA approach was used 3. Atrial fibrillation successfully cardioverted to sinus rhythm. 4. No early apparent complications.  08/01/22: Cardiac CT IMPRESSION: 1.  Severe bi atrial enlargement 2.  No LAA thrombus 3.  Aneurysmal atrial septum with PFO 4.  Normal PV anatomy with right middle PV see measurements above 5.  Normal ascending thoracic aorta 3.7 cm 6.  No pericardial effusion 7.  Breast implants noted 8.  Calcium  score is 0   05/16/21: TTE 1. Left ventricular ejection fraction, by estimation, is 50% with beat to  beat variablity in afib. The left ventricle has low normal function. The  left ventricle has no regional wall motion abnormalities. There is mild  left ventricular hypertrophy. Left   ventricular diastolic parameters are indeterminate.   2. Right ventricular systolic function is normal. The right ventricular  size is normal. There is normal pulmonary artery systolic pressure. The  estimated right ventricular systolic pressure is 27.8 mmHg.   3. Left atrial size  was severely dilated.   4. Right atrial size was severely dilated.   5. The mitral valve is grossly normal. Mild mitral valve regurgitation.  No evidence of mitral stenosis.   6. The aortic valve is tricuspid. There is mild thickening of the aortic  valve. Aortic valve regurgitation is mild. No aortic stenosis is present.   7. The inferior vena  cava is normal in size with greater than 50%  respiratory variability, suggesting right atrial pressure of 3 mmHg.    Risk Assessment/Calculations:    Physical Exam:   VS:  There were no vitals taken for this visit.   Wt Readings from Last 3 Encounters:  03/25/24 160 lb 3.2 oz (72.7 kg)  03/23/24 159 lb (72.1 kg)  02/25/24 159 lb (72.1 kg)    GEN: Well nourished, well developed in no acute distress NECK: No JVD; No carotid bruits CARDIAC: *** irregular, tachycardic, no murmurs, rubs, gallops RESPIRATORY: *** CTA b/l without rales, wheezing or rhonchi  ABDOMEN: Soft, non-tender, non-distended EXTREMITIES: *** No edema; No deformity   ASSESSMENT AND PLAN: .    persistent AFib Atypical atrial flutter CHA2DS2Vasc is 3, on Eliquis , *** appropriately dosed AFlutter today/RVR she is completely asymptomatic/unaware Tikosyn  *** Med list is OK     Secondary hypercoagulable state 2/2 AFib  4.  PVD Right subclavian artery stenosis and R distal internal carotid dissection 4. HTN No claudication RUE In review of vascular note (2023) felt reasonable to use LUE for BP's I have asked her PMD to clairfy/confirm this rec. Dr. Eliza did not rec further w/u without symptoms (2023)         Dispo: back in ***, sooner if needed  Signed, Charlies Macario Arthur, PA-C

## 2024-04-02 ENCOUNTER — Ambulatory Visit: Payer: Self-pay | Attending: Physician Assistant | Admitting: Physician Assistant

## 2024-04-02 ENCOUNTER — Encounter: Payer: Self-pay | Admitting: Physician Assistant

## 2024-04-02 VITALS — BP 126/58 | HR 120 | Ht 64.5 in | Wt 160.2 lb

## 2024-04-02 DIAGNOSIS — I4819 Other persistent atrial fibrillation: Secondary | ICD-10-CM

## 2024-04-02 DIAGNOSIS — Z5181 Encounter for therapeutic drug level monitoring: Secondary | ICD-10-CM | POA: Diagnosis not present

## 2024-04-02 DIAGNOSIS — D6869 Other thrombophilia: Secondary | ICD-10-CM | POA: Diagnosis not present

## 2024-04-02 DIAGNOSIS — I10 Essential (primary) hypertension: Secondary | ICD-10-CM | POA: Diagnosis not present

## 2024-04-02 DIAGNOSIS — Z79899 Other long term (current) drug therapy: Secondary | ICD-10-CM

## 2024-04-02 MED ORDER — AMIODARONE HCL 200 MG PO TABS: ORAL_TABLET | ORAL | 0 refills | Status: DC

## 2024-04-02 NOTE — Patient Instructions (Addendum)
 Medication Instructions:  1.Stop tikosyn  today 2.Start amiodarone on 04/05/24, take 200 mg twice daily for 2 weeks then take 200 mg once daily thereafter *If you need a refill on your cardiac medications before your next appointment, please call your pharmacy*  Lab Work: None ordered If you have labs (blood work) drawn today and your tests are completely normal, you will receive your results only by: MyChart Message (if you have MyChart) OR A paper copy in the mail If you have any lab test that is abnormal or we need to change your treatment, we will call you to review the results.  Follow-Up: At [facility], you and your health needs are our priority.  As part of our continuing mission to provide you with exceptional heart care, our providers are all part of one team.  This team includes your primary Cardiologist (physician) and Advanced Practice Providers or APPs (Physician Assistants and Nurse Practitioners) who all work together to provide you with the care you need, when you need it.  Your next appointment:   1 month(s)  Provider:   Charlies Arthur, PA-C

## 2024-04-22 ENCOUNTER — Telehealth: Payer: Self-pay

## 2024-04-22 DIAGNOSIS — I4819 Other persistent atrial fibrillation: Secondary | ICD-10-CM

## 2024-04-22 NOTE — Telephone Encounter (Signed)
*  late entry*  I called pt yesterday afternoon (10/28) and scheduled her Afib Ablation with Dr. Inocencio on 12/330 at 1:30 pm.  CT is scheduled on 12/8 at 1:00 pm. She will get pre-procedure labs done same day.  I will mail her Instruction letters to her once completed.   Pt seen Renee on 10/9 and was started on Amiodarone - Ablation was discussed and she has a f/u with Renee on 11/11.  I informed her to keep that f/u appointment but that I was scheduling her since we were scheduling so far out.

## 2024-04-29 NOTE — Progress Notes (Unsigned)
 Cardiology Office Note:  .   Date:  04/29/2024  ID:  Angela Dean, DOB [birth year], MRN [id number] PCP: Angela Mustard, FNP  [facility] Providers Cardiologist:  Angela Ronal BRAVO, MD (Inactive) Electrophysiologist:  Angela Angela Norton, MD {  History of Present Illness: Angela Dean is a 68 y.o. female w/PMHx of   thyroid  nodule (s/p thyroidectomy), breast ca (s/o L mastectomy),  PVD (R subclavian artery stenosis/R ICA dissection),  AFib  Saw Dr. Norton 11/12/22, doing well, no symptoms of AFib, no changes  I saw her 06/21/23 Uncertain if she would recognize Afib, though didn't think she had any Stable QTc Med list OK, planned for labs and 4 mo visit  She saw Dr. Alvan 09/23/23, she was doing well, statin intolerant  I saw her 10/22/23 She continues to feel well No palpitations or cardiac awareness No CP, SOB, DOE No near syncope or syncope No  neuro symptoms, no RUE pain/claudication Stable EKG/QT, meds PVD RUE and mastectomy on Left side In review of vascular note (2023) felt reasonable to use LUE for BP's   I saw her 02/25/24 She continues to feel well No palpitations or cardiac awareness and is very surprised to hear she is out of rhythm and going fast. She did some work in the yard over the weekend, felt well, did not think she overdid anything No CP, SOB, DOE No near syncope or syncope No  neuro symptoms, no RUE pain/claudication She reports excellent medication compliance Specifically no missed tikosyn  or Eliquis  doses in the last 3 weeks if ever She was in Afib w/RVR, the 1st KNOWN episode post ablation She was unaware of the AFib/rate. Metoprolol  increased slightly, planned for DCCV  DCCV 02/28/24 successful  ERAF noted by her Primary provider 03/23/24  AFib clinic visit 03/25/24, again, without symptoms, awareness.  She preferred ablation (repeat) as a management strategy, Toprol  increased Discussed possible failure of Tikosyn , with mention of  possible need for amio as a bridge to ablation  I saw her 04/02/24 She is accompanied by her daughter She again is unaware of her AFib/flutter Since on metoprolol  BID > home BPS good 120's/70's-90's mostly, HRs 70's-100s by KARDIA electrograms and her BP cuff No CP, palpitations No SOB No near syncope or syncope Reports excellent medications compliance with no missed doses of Eliquis . D/w Dr. Margretta Recs:  Stop Tikosyn  >> transition to amiodarone as a bridge to ablation Stop Tikosyn  today Start amiodarone on 04/05/24 200mg  BID for 2 weeks > daily Ill have her back in ~ 1 mo to plan DCCV on amio  She is scheduled for AFib ablation 06/24/23  Today's visit is scheduled as a 1 mo f/u ROS:   She is accompanied by her daughter Reports feeling well No CP, some mild awareness of palpitations/irregular heart beat No SOB No near syncope or syncope  She is aware of her AFib ablation and CT schedule She reports very good medication compliance, no missed doses of her Eliquis .   Arrhythmia/AAD hx AFib found 2022 Tikosyn  started Jan 2023 PVI ablation 08/09/22  Studies Reviewed: [name]    EKG done today and reviewed by myself:  AFib 88 and 90bpm  08/09/22: EPS/ablation CONCLUSIONS: 1. Atrial fibrillation upon presentation.   2. Successful electrical isolation and anatomical encircling of all four pulmonary veins with radiofrequency current.  A WACA approach was used 3. Atrial fibrillation successfully cardioverted to sinus rhythm. 4. No early apparent complications.  08/01/22: Cardiac CT IMPRESSION: 1.  Severe bi atrial enlargement 2.  No LAA thrombus 3.  Aneurysmal atrial septum with PFO 4.  Normal PV anatomy with right middle PV see measurements above 5.  Normal ascending thoracic aorta 3.7 cm 6.  No pericardial effusion 7.  Breast implants noted 8.  Calcium  score is 0   05/16/21: TTE 1. Left ventricular ejection fraction, by estimation, is 50% with beat to  beat variablity in  afib. The left ventricle has low normal function. The  left ventricle has no regional wall motion abnormalities. There is mild  left ventricular hypertrophy. Left   ventricular diastolic parameters are indeterminate.   2. Right ventricular systolic function is normal. The right ventricular  size is normal. There is normal pulmonary artery systolic pressure. The  estimated right ventricular systolic pressure is 27.8 mmHg.   3. Left atrial size was severely dilated.   4. Right atrial size was severely dilated.   5. The mitral valve is grossly normal. Mild mitral valve regurgitation.  No evidence of mitral stenosis.   6. The aortic valve is tricuspid. There is mild thickening of the aortic  valve. Aortic valve regurgitation is mild. No aortic stenosis is present.   7. The inferior vena cava is normal in size with greater than 50%  respiratory variability, suggesting right atrial pressure of 3 mmHg.    Risk Assessment/Calculations:    Physical Exam:   VS:  There were no vitals taken for this visit.   Wt Readings from Last 3 Encounters:  04/02/24 160 lb 3.2 oz (72.7 kg)  03/25/24 160 lb 3.2 oz (72.7 kg)  03/23/24 159 lb (72.1 kg)    GEN: Well nourished, well developed in no acute distress NECK: No JVD; No carotid bruits CARDIAC: irregular, tachycardic, no murmurs, rubs, gallops RESPIRATORY: CTA b/l without rales, wheezing or rhonchi  ABDOMEN: Soft, non-tender, non-distended EXTREMITIES: No edema; No deformity   ASSESSMENT AND PLAN: .    persistent AFib Atypical atrial flutter CHA2DS2Vasc is 3, on Eliquis , appropriately dosed Amiodarone  I think it is worth it to try to get SR/DCCV, discussed rational for that > she is agreeable We discussed procedure, potential risks, benefits  We discussed Ablation She had no real questions having had prior ablation We discussed potetial procedure risks/benefots Discussed day of procedure expectations She remains agreeable  Angela get labs  today for DCCV/amiodarone surveillance She Angela need pre-ablation labs > plan for 12/8 when she is here for her CT  Informed Consent   Shared Decision Making/Informed Consent{ The risks (stroke, cardiac arrhythmias rarely resulting in the need for a temporary or permanent pacemaker, skin irritation or burns and complications associated with conscious sedation including aspiration, arrhythmia, respiratory failure and death), benefits (restoration of normal sinus rhythm) and alternatives of a direct current cardioversion were explained in detail to Ms. Rost and she agrees to proceed.         Secondary hypercoagulable state 2/2 AFib  4.  PVD Right subclavian artery stenosis and R distal internal carotid dissection 4. HTN No claudication RUE In review of vascular note (2023) felt reasonable to use LUE for BP's I have asked her PMD to clairfy/confirm this rec. Dr. Eliza did not rec further w/u without symptoms (2023)     Dispo: usual post ablation follow up, sooner if needed  Signed, Charlies Macario Arthur, PA-C

## 2024-04-29 NOTE — H&P (View-Only) (Signed)
 Cardiology Office Note:  .   Date:  04/29/2024  ID:  Angela Dean, DOB 08-Aug-1955, MRN [id number] PCP: Gladis Mustard, FNP  [facility] Providers Cardiologist:  Angela Ronal BRAVO, MD (Inactive) Electrophysiologist:  Angela Gladis Norton, MD {  History of Present Illness: Angela Dean is a 68 y.o. female w/PMHx of   thyroid  nodule (s/p thyroidectomy), breast ca (s/o L mastectomy),  PVD (R subclavian artery stenosis/R ICA dissection),  AFib  Saw Dr. Norton 11/12/22, doing well, no symptoms of AFib, no changes  I saw her 06/21/23 Uncertain if she would recognize Afib, though didn't think she had any Stable QTc Med list OK, planned for labs and 4 mo visit  She saw Dr. Alvan 09/23/23, she was doing well, statin intolerant  I saw her 10/22/23 She continues to feel well No palpitations or cardiac awareness No CP, SOB, DOE No near syncope or syncope No  neuro symptoms, no RUE pain/claudication Stable EKG/QT, meds PVD RUE and mastectomy on Left side In review of vascular note (2023) felt reasonable to use LUE for BP's   I saw her 02/25/24 She continues to feel well No palpitations or cardiac awareness and is very surprised to hear she is out of rhythm and going fast. She did some work in the yard over the weekend, felt well, did not think she overdid anything No CP, SOB, DOE No near syncope or syncope No  neuro symptoms, no RUE pain/claudication She reports excellent medication compliance Specifically no missed tikosyn  or Eliquis  doses in the last 3 weeks if ever She was in Afib w/RVR, the 1st KNOWN episode post ablation She was unaware of the AFib/rate. Metoprolol  increased slightly, planned for DCCV  DCCV 02/28/24 successful  ERAF noted by her Primary provider 03/23/24  AFib clinic visit 03/25/24, again, without symptoms, awareness.  She preferred ablation (repeat) as a management strategy, Toprol  increased Discussed possible failure of Tikosyn , with mention of  possible need for amio as a bridge to ablation  I saw her 04/02/24 She is accompanied by her daughter She again is unaware of her AFib/flutter Since on metoprolol  BID > home BPS good 120's/70's-90's mostly, HRs 70's-100s by KARDIA electrograms and her BP cuff No CP, palpitations No SOB No near syncope or syncope Reports excellent medications compliance with no missed doses of Eliquis . D/w Dr. Margretta Recs:  Stop Tikosyn  >> transition to amiodarone as a bridge to ablation Stop Tikosyn  today Start amiodarone on 04/05/24 200mg  BID for 2 weeks > daily Ill have her back in ~ 1 mo to plan DCCV on amio  She is scheduled for AFib ablation 06/24/23  Today's visit is scheduled as a 1 mo f/u ROS:   She is accompanied by her daughter Reports feeling well No CP, some mild awareness of palpitations/irregular heart beat No SOB No near syncope or syncope  She is aware of her AFib ablation and CT schedule She reports very good medication compliance, no missed doses of her Eliquis .   Arrhythmia/AAD hx AFib found 2022 Tikosyn  started Jan 2023 PVI ablation 08/09/22  Studies Reviewed: [name]    EKG done today and reviewed by myself:  AFib 88 and 90bpm  08/09/22: EPS/ablation CONCLUSIONS: 1. Atrial fibrillation upon presentation.   2. Successful electrical isolation and anatomical encircling of all four pulmonary veins with radiofrequency current.  A WACA approach was used 3. Atrial fibrillation successfully cardioverted to sinus rhythm. 4. No early apparent complications.  08/01/22: Cardiac CT IMPRESSION: 1.  Severe bi atrial enlargement 2.  No LAA thrombus 3.  Aneurysmal atrial septum with PFO 4.  Normal PV anatomy with right middle PV see measurements above 5.  Normal ascending thoracic aorta 3.7 cm 6.  No pericardial effusion 7.  Breast implants noted 8.  Calcium  score is 0   05/16/21: TTE 1. Left ventricular ejection fraction, by estimation, is 50% with beat to  beat variablity in  afib. The left ventricle has low normal function. The  left ventricle has no regional wall motion abnormalities. There is mild  left ventricular hypertrophy. Left   ventricular diastolic parameters are indeterminate.   2. Right ventricular systolic function is normal. The right ventricular  size is normal. There is normal pulmonary artery systolic pressure. The  estimated right ventricular systolic pressure is 27.8 mmHg.   3. Left atrial size was severely dilated.   4. Right atrial size was severely dilated.   5. The mitral valve is grossly normal. Mild mitral valve regurgitation.  No evidence of mitral stenosis.   6. The aortic valve is tricuspid. There is mild thickening of the aortic  valve. Aortic valve regurgitation is mild. No aortic stenosis is present.   7. The inferior vena cava is normal in size with greater than 50%  respiratory variability, suggesting right atrial pressure of 3 mmHg.    Risk Assessment/Calculations:    Physical Exam:   VS:  There were no vitals taken for this visit.   Wt Readings from Last 3 Encounters:  04/02/24 160 lb 3.2 oz (72.7 kg)  03/25/24 160 lb 3.2 oz (72.7 kg)  03/23/24 159 lb (72.1 kg)    GEN: Well nourished, well developed in no acute distress NECK: No JVD; No carotid bruits CARDIAC: irregular, tachycardic, no murmurs, rubs, gallops RESPIRATORY: CTA b/l without rales, wheezing or rhonchi  ABDOMEN: Soft, non-tender, non-distended EXTREMITIES: No edema; No deformity   ASSESSMENT AND PLAN: .    persistent AFib Atypical atrial flutter CHA2DS2Vasc is 3, on Eliquis , appropriately dosed Amiodarone  I think it is worth it to try to get SR/DCCV, discussed rational for that > she is agreeable We discussed procedure, potential risks, benefits  We discussed Ablation She had no real questions having had prior ablation We discussed potetial procedure risks/benefots Discussed day of procedure expectations She remains agreeable  Angela get labs  today for DCCV/amiodarone surveillance She Angela need pre-ablation labs > plan for 12/8 when she is here for her CT  Informed Consent   Shared Decision Making/Informed Consent{ The risks (stroke, cardiac arrhythmias rarely resulting in the need for a temporary or permanent pacemaker, skin irritation or burns and complications associated with conscious sedation including aspiration, arrhythmia, respiratory failure and death), benefits (restoration of normal sinus rhythm) and alternatives of a direct current cardioversion were explained in detail to Ms. Balles and she agrees to proceed.         Secondary hypercoagulable state 2/2 AFib  4.  PVD Right subclavian artery stenosis and R distal internal carotid dissection 4. HTN No claudication RUE In review of vascular note (2023) felt reasonable to use LUE for BP's I have asked her PMD to clairfy/confirm this rec. Dr. Eliza did not rec further w/u without symptoms (2023)     Dispo: usual post ablation follow up, sooner if needed  Signed, Charlies Macario Arthur, PA-C

## 2024-05-05 ENCOUNTER — Ambulatory Visit: Admitting: Physician Assistant

## 2024-05-05 ENCOUNTER — Ambulatory Visit: Attending: Physician Assistant | Admitting: Physician Assistant

## 2024-05-05 ENCOUNTER — Encounter: Payer: Self-pay | Admitting: *Deleted

## 2024-05-05 VITALS — BP 119/87 | HR 88 | Ht 64.5 in | Wt 161.0 lb

## 2024-05-05 DIAGNOSIS — I4819 Other persistent atrial fibrillation: Secondary | ICD-10-CM

## 2024-05-05 DIAGNOSIS — I484 Atypical atrial flutter: Secondary | ICD-10-CM

## 2024-05-05 DIAGNOSIS — D6869 Other thrombophilia: Secondary | ICD-10-CM | POA: Diagnosis not present

## 2024-05-05 MED ORDER — AMIODARONE HCL 200 MG PO TABS: 200.0000 mg | ORAL_TABLET | Freq: Every day | ORAL | 3 refills | Status: AC

## 2024-05-05 NOTE — Patient Instructions (Addendum)
 Medication Instructions:   Your physician recommends that you continue on your current medications as directed. Please refer to the Current Medication list given to you today.  *If you need a refill on your cardiac medications before your next appointment, please call your pharmacy*   Lab Work:   PLEASE GO DOWN STAIRS  [facility]  FIRST FLOOR   ( GET OFF ELEVATORS WALK TOWARDS WAITING AREA LAB LOCATED BY PHARMACY):  TODAY  CMET  CBC AND TSH   RETURN  WEEK  DECEMBER 8TH  FOR  BMET AND CBC      If you have labs (blood work) drawn today and your tests are completely normal, you will receive your results only by: MyChart Message (if you have MyChart) OR A paper copy in the mail If you have any lab test that is abnormal or we need to change your treatment, we will call you to review the results.    Testing/Procedures: ON  05-12-24   SEE LETTER ATTACHED   Your physician has recommended that you have a Cardioversion (DCCV). Electrical Cardioversion uses a jolt of electricity to your heart either through paddles or wired patches attached to your chest. This is a controlled, usually prescheduled, procedure. Defibrillation is done under light anesthesia in the hospital, and you usually go home the day of the procedure. This is done to get your heart back into a normal rhythm. You are not awake for the procedure. Please see the instruction sheet given to you today.    Follow-Up: At [facility], you and your health needs are our priority.  As part of our continuing mission to provide you with exceptional heart care, our providers are all part of one team.  This team includes your primary Cardiologist (physician) and Advanced Practice Providers or APPs (Physician Assistants and Nurse Practitioners) who all work together to provide you with the care you need, when you need it.  Your next appointment:   AS SCHEDULED     We recommend signing up for the patient portal called MyChart.  Sign up  information is provided on this After Visit Summary.  MyChart is used to connect with patients for Virtual Visits (Telemedicine).  Patients are able to view lab/test results, encounter notes, upcoming appointments, etc.  Non-urgent messages can be sent to your provider as well.   To learn more about what you can do with MyChart, go to [email or web address].   Other Instructions

## 2024-05-06 ENCOUNTER — Ambulatory Visit: Payer: Self-pay | Admitting: Physician Assistant

## 2024-05-06 LAB — COMPREHENSIVE METABOLIC PANEL WITH GFR
ALT: 20 IU/L (ref 0–32)
AST: 22 IU/L (ref 0–40)
Albumin: 4.5 g/dL (ref 3.9–4.9)
Alkaline Phosphatase: 44 IU/L — AB (ref 49–135)
BUN/Creatinine Ratio: 16 (ref 12–28)
BUN: 15 mg/dL (ref 8–27)
Bilirubin Total: 0.7 mg/dL (ref 0.0–1.2)
CO2: 23 mmol/L (ref 20–29)
Calcium: 9.5 mg/dL (ref 8.7–10.3)
Chloride: 104 mmol/L (ref 96–106)
Creatinine, Ser: 0.93 mg/dL (ref 0.57–1.00)
Globulin, Total: 2.1 g/dL (ref 1.5–4.5)
Glucose: 79 mg/dL (ref 70–99)
Potassium: 4.5 mmol/L (ref 3.5–5.2)
Sodium: 141 mmol/L (ref 134–144)
Total Protein: 6.6 g/dL (ref 6.0–8.5)
eGFR: 67 mL/min/1.73 (ref >59)

## 2024-05-06 LAB — CBC
Hematocrit: 44.1 % (ref 34.0–46.6)
Hemoglobin: 14.3 g/dL (ref 11.1–15.9)
MCH: 29.1 pg (ref 26.6–33.0)
MCHC: 32.4 g/dL (ref 31.5–35.7)
MCV: 90 fL (ref 79–97)
Platelets: 212 x10E3/uL (ref 150–450)
RBC: 4.92 x10E6/uL (ref 3.77–5.28)
RDW: 14.4 % (ref 11.7–15.4)
WBC: 7.5 x10E3/uL (ref 3.4–10.8)

## 2024-05-06 LAB — TSH: TSH: 3.87 u[IU]/mL (ref 0.450–4.500)

## 2024-05-08 NOTE — Progress Notes (Signed)
 Pt called for pre procedure instructions.  Left message to call back. Arrival time 0800 NPO after midnight explained Instructed to take am meds with sip of water and confirmed blood thinner consistency Instructed pt need for ride home tomorrow and have responsible adult with them for 24 hrs post procedure.

## 2024-05-11 ENCOUNTER — Ambulatory Visit (HOSPITAL_COMMUNITY): Admitting: Anesthesiology

## 2024-05-11 ENCOUNTER — Ambulatory Visit (HOSPITAL_COMMUNITY)
Admission: RE | Admit: 2024-05-11 | Discharge: 2024-05-11 | Disposition: A | Discharge status: Home / Self Care | Attending: Cardiovascular Disease | Admitting: Cardiovascular Disease

## 2024-05-11 ENCOUNTER — Encounter (HOSPITAL_COMMUNITY): Payer: Self-pay | Admitting: Cardiovascular Disease

## 2024-05-11 ENCOUNTER — Other Ambulatory Visit: Payer: Self-pay

## 2024-05-11 ENCOUNTER — Encounter (HOSPITAL_COMMUNITY)
Admission: RE | Disposition: A | Discharge status: Home / Self Care | Payer: Self-pay | Source: Home / Self Care | Attending: Cardiovascular Disease

## 2024-05-11 DIAGNOSIS — I4819 Other persistent atrial fibrillation: Secondary | ICD-10-CM | POA: Insufficient documentation

## 2024-05-11 DIAGNOSIS — I739 Peripheral vascular disease, unspecified: Secondary | ICD-10-CM | POA: Insufficient documentation

## 2024-05-11 DIAGNOSIS — Z87891 Personal history of nicotine dependence: Secondary | ICD-10-CM | POA: Diagnosis not present

## 2024-05-11 DIAGNOSIS — E890 Postprocedural hypothyroidism: Secondary | ICD-10-CM | POA: Insufficient documentation

## 2024-05-11 DIAGNOSIS — D6869 Other thrombophilia: Secondary | ICD-10-CM | POA: Diagnosis not present

## 2024-05-11 DIAGNOSIS — I4891 Unspecified atrial fibrillation: Secondary | ICD-10-CM

## 2024-05-11 DIAGNOSIS — Z006 Encounter for examination for normal comparison and control in clinical research program: Secondary | ICD-10-CM

## 2024-05-11 DIAGNOSIS — Z7901 Long term (current) use of anticoagulants: Secondary | ICD-10-CM | POA: Diagnosis not present

## 2024-05-11 DIAGNOSIS — I10 Essential (primary) hypertension: Secondary | ICD-10-CM | POA: Diagnosis not present

## 2024-05-11 DIAGNOSIS — I484 Atypical atrial flutter: Secondary | ICD-10-CM | POA: Insufficient documentation

## 2024-05-11 HISTORY — PX: CARDIOVERSION: EP1203

## 2024-05-11 LAB — POCT I-STAT, CHEM 8
BUN: 12 mg/dL (ref 8–23)
Calcium, Ion: 1.19 mmol/L (ref 1.15–1.40)
Chloride: 105 mmol/L (ref 98–111)
Creatinine, Ser: 1 mg/dL (ref 0.44–1.00)
Glucose, Bld: 94 mg/dL (ref 70–99)
HCT: 40 % (ref 36.0–46.0)
Hemoglobin: 13.6 g/dL (ref 12.0–15.0)
Potassium: 4.1 mmol/L (ref 3.5–5.1)
Sodium: 141 mmol/L (ref 135–145)
TCO2: 24 mmol/L (ref 22–32)

## 2024-05-11 SURGERY — CARDIOVERSION (CATH LAB)
Anesthesia: General

## 2024-05-11 MED ORDER — SODIUM CHLORIDE 0.9% FLUSH: 3.0000 mL | Freq: Two times a day (BID) | INTRAVENOUS | Status: DC

## 2024-05-11 MED ORDER — SODIUM CHLORIDE 0.9% FLUSH: 3.0000 mL | INTRAVENOUS | Status: DC | PRN

## 2024-05-11 MED ORDER — SODIUM CHLORIDE 0.9 % IV SOLN: 250.0000 mL | INTRAVENOUS | Status: DC | PRN

## 2024-05-11 MED ORDER — PROPOFOL 10 MG/ML IV BOLUS
INTRAVENOUS | Status: DC | PRN
  Administered 2024-05-11: 60 mg via INTRAVENOUS

## 2024-05-11 MED ORDER — METOPROLOL SUCCINATE ER 50 MG PO TB24: 50.0000 mg | ORAL_TABLET | Freq: Two times a day (BID) | ORAL | 2 refills | Status: AC

## 2024-05-11 SURGICAL SUPPLY — 1 items: PAD DEFIB RADIO PHYSIO CONN (PAD) ×2 IMPLANT

## 2024-05-11 NOTE — CV Procedure (Signed)
 Electrical Cardioversion Procedure Note JASSICA ZAZUETA [id number] 01-Dec-1955  Procedure: Electrical Cardioversion Indications:  Atrial Fibrillation  Procedure Details Consent: Risks of procedure as well as the alternatives and risks of each were explained to the (patient/caregiver).  Consent for procedure obtained. Time Out: Verified patient identification, verified procedure, site/side was marked, verified correct patient position, special equipment/implants available, medications/allergies/relevent history reviewed, required imaging and test results available.  Performed  Patient placed on cardiac monitor, pulse oximetry, supplemental oxygen as necessary.  Sedation given: propofol  Pacer pads placed anterior and posterior chest.  Cardioverted 1 time(s).  Cardioverted at 200J.  Evaluation Findings: Post procedure EKG shows: sinus bradycardia with PACs Complications: None Patient did tolerate procedure well.   Annabella Scarce, MD 05/11/2024, 9:23 AM

## 2024-05-11 NOTE — Interval H&P Note (Signed)
 History and Physical Interval Note:  05/11/2024 8:13 AM  Angela Dean  has presented today for surgery, with the diagnosis of AFIB.  The various methods of treatment have been discussed with the patient and family. After consideration of risks, benefits and other options for treatment, the patient has consented to  Procedure(s): CARDIOVERSION (N/A) as a surgical intervention.  The patient's history has been reviewed, patient examined, no change in status, stable for surgery.  I have reviewed the patient's chart and labs.  Questions were answered to the patient's satisfaction.     Annabella Scarce, MD

## 2024-05-11 NOTE — Research (Signed)
 [facility] Cardioversion Informed Consent   Subject Name: Angela Dean  Subject met inclusion and exclusion criteria.  The informed consent form, study requirements and expectations were reviewed with the subject and questions and concerns were addressed prior to the signing of the consent form.  The subject verbalized understanding of the trial requirements.  The subject agreed to participate in the [facility] Cardioversion trial and signed the informed consent at 0830 on 17/Nov/2025.  The informed consent was obtained prior to performance of any protocol-specific procedures for the subject.  A copy of the signed informed consent was given to the subject and a copy was placed in the subject's medical record.   Rosaline BIRCH [name]

## 2024-05-11 NOTE — Discharge Instructions (Signed)
 Reduce your metoprolol  to 50mg  twice daily.  Check your heart rate before your dose this evening.  Only take the medication if you heart rate is greater than 60 bpm.

## 2024-05-11 NOTE — Transfer of Care (Signed)
 Immediate Anesthesia Transfer of Care Note  Patient: Angela Dean  Procedure(s) Performed: CARDIOVERSION  Patient Location: PACU and Cath Lab  Anesthesia Type:General  Level of Consciousness: drowsy  Airway & Oxygen Therapy: Patient Spontanous Breathing and Patient connected to nasal cannula oxygen  Post-op Assessment: Report given to RN and Post -op Vital signs reviewed and stable  Post vital signs: Reviewed and stable  Last Vitals:  Vitals Value Taken Time  BP  87/62  Temp    Pulse  48  Resp  32  SpO2  95    Last Pain:  Vitals:   05/11/24 0806  TempSrc: Temporal         Complications: No notable events documented.

## 2024-05-12 ENCOUNTER — Ambulatory Visit: Payer: Self-pay | Admitting: Nurse Practitioner

## 2024-05-12 NOTE — Anesthesia Preprocedure Evaluation (Signed)
 Anesthesia Evaluation  Patient identified by MRN, date of birth, ID band Patient awake    Reviewed: Allergy & Precautions, NPO status , Patient's Chart, lab work & pertinent test results  History of Anesthesia Complications Negative for: history of anesthetic complications  Airway Mallampati: II  TM Distance: >3 FB Neck ROM: Full    Dental  (+) Teeth Intact, Partial Lower, Dental Advisory Given   Pulmonary neg pulmonary ROS, neg sleep apnea, neg COPD, Patient abstained from smoking.Not current smoker   breath sounds clear to auscultation       Cardiovascular Exercise Tolerance: Good METShypertension, Pt. on medications (-) CAD and (-) Past MI + dysrhythmias Atrial Fibrillation + Valvular Problems/Murmurs MR  Rhythm:Irregular - Systolic murmurs  1. Left ventricular ejection fraction, by estimation, is 50% with beat to  beat variablity in afib. The left ventricle has low normal function. The  left ventricle has no regional wall motion abnormalities. There is mild  left ventricular hypertrophy. Left   ventricular diastolic parameters are indeterminate.   2. Right ventricular systolic function is normal. The right ventricular  size is normal. There is normal pulmonary artery systolic pressure. The  estimated right ventricular systolic pressure is 27.8 mmHg.   3. Left atrial size was severely dilated.   4. Right atrial size was severely dilated.   5. The mitral valve is grossly normal. Mild mitral valve regurgitation.  No evidence of mitral stenosis.   6. The aortic valve is tricuspid. There is mild thickening of the aortic  valve. Aortic valve regurgitation is mild. No aortic stenosis is present.   7. The inferior vena cava is normal in size with greater than 50%  respiratory variability, suggesting right atrial pressure of 3 mmHg.     Neuro/Psych negative neurological ROS  negative psych ROS   GI/Hepatic ,neg GERD  ,,(+)      (-) substance abuse    Endo/Other  neg diabetesHypothyroidism    Renal/GU negative Renal ROS     Musculoskeletal   Abdominal   Peds  Hematology   Anesthesia Other Findings   Reproductive/Obstetrics                              Anesthesia Physical Anesthesia Plan  ASA: 3  Anesthesia Plan: General   Post-op Pain Management: Minimal or no pain anticipated   Induction: Intravenous  PONV Risk Score and Plan: 3 and Propofol  infusion and Treatment may vary due to age or medical condition  Airway Management Planned: Nasal Cannula, Natural Airway and Simple Face Mask  Additional Equipment: None  Intra-op Plan:   Post-operative Plan:   Informed Consent: I have reviewed the patients History and Physical, chart, labs and discussed the procedure including the risks, benefits and alternatives for the proposed anesthesia with the patient or authorized representative who has indicated his/her understanding and acceptance.     Dental advisory given  Plan Discussed with: CRNA  Anesthesia Plan Comments: (Discussed risks of anesthesia with patient, including possibility of difficulty with spontaneous ventilation under anesthesia necessitating airway intervention, PONV, and rare risks such as cardiac or respiratory or neurological events, and allergic reactions. Discussed the role of CRNA in patient's perioperative care. Patient understands.)         Anesthesia Quick Evaluation

## 2024-05-12 NOTE — Anesthesia Postprocedure Evaluation (Signed)
 Anesthesia Post Note  Patient: Angela Dean  Procedure(s) Performed: CARDIOVERSION     Patient location during evaluation: Cath Lab Anesthesia Type: General Level of consciousness: awake and alert Pain management: pain level controlled Vital Signs Assessment: post-procedure vital signs reviewed and stable Respiratory status: spontaneous breathing, nonlabored ventilation and respiratory function stable Cardiovascular status: blood pressure returned to baseline and stable Postop Assessment: no apparent nausea or vomiting Anesthetic complications: no   No notable events documented.                 Duaa Stelzner

## 2024-05-28 ENCOUNTER — Encounter (HOSPITAL_COMMUNITY): Payer: Self-pay

## 2024-05-28 ENCOUNTER — Telehealth (HOSPITAL_COMMUNITY): Payer: Self-pay

## 2024-05-28 NOTE — Telephone Encounter (Signed)
 Spoke with patient to complete pre-procedure call.     Health status review:  Any new medical conditions, recent signs of acute illness or been started on antibiotics? No Any recent hospitalizations or surgeries? No Any new medications started since pre-op  visit? No  Follow all medication instructions prior to procedure or the procedure may be rescheduled:    Continue taking Eliquis  (Apixaban ) twice daily without missing any doses before procedure. Essential chronic medications:  No medication should be continued, unless told otherwise. On the morning of your procedure DO NOT take any medication., including Eliquis  (Apixaban ).  Nothing to eat or drink after midnight prior to your procedure.  Pre-procedure testing scheduled: CT and lab work on December 8.  Confirmed patient is scheduled for Atrial Fibrillation Ablation on Tuesday, December 30 with Dr. Inocencio. Instructed patient to arrive at the Main Entrance A at [facility]: [address] [place], [place] [address] and check in at Admitting at 11:30 AM.  Plan to go home the same day, you will only stay overnight if medically necessary. You MUST have a responsible adult to drive you home and MUST be with you the first 24 hours after you arrive home or your procedure could be cancelled.  Informed a nurse may call a day before the procedure to confirm arrival time and ensure instructions are followed.  Patient verbalized understanding to information provided and is agreeable to proceed with procedure.   Advised to contact RN Navigator at [phone], to inform of any new medications started after call or concerns prior to procedure.

## 2024-06-01 ENCOUNTER — Ambulatory Visit (HOSPITAL_COMMUNITY): Admission: RE | Admit: 2024-06-01 | Source: Ambulatory Visit

## 2024-06-09 ENCOUNTER — Ambulatory Visit (HOSPITAL_COMMUNITY): Admission: RE | Admit: 2024-06-09 | Discharge: 2024-06-09 | Attending: Cardiology

## 2024-06-09 DIAGNOSIS — I4819 Other persistent atrial fibrillation: Secondary | ICD-10-CM | POA: Diagnosis present

## 2024-06-09 MED ORDER — IOHEXOL 350 MG/ML SOLN
80.0000 mL | Freq: Once | INTRAVENOUS | Status: AC | PRN
  Administered 2024-06-09: 13:00:00 80 mL via INTRAVENOUS

## 2024-06-10 LAB — BASIC METABOLIC PANEL WITH GFR
BUN/Creatinine Ratio: 13 (ref 12–28)
BUN: 13 mg/dL (ref 8–27)
CO2: 25 mmol/L (ref 20–29)
Calcium: 9.4 mg/dL (ref 8.7–10.3)
Chloride: 102 mmol/L (ref 96–106)
Creatinine, Ser: 0.97 mg/dL (ref 0.57–1.00)
Glucose: 82 mg/dL (ref 70–99)
Potassium: 5.1 mmol/L (ref 3.5–5.2)
Sodium: 139 mmol/L (ref 134–144)
eGFR: 64 mL/min/1.73 (ref >59)

## 2024-06-10 LAB — CBC
Hematocrit: 43 % (ref 34.0–46.6)
Hemoglobin: 13.8 g/dL (ref 11.1–15.9)
MCH: 29.4 pg (ref 26.6–33.0)
MCHC: 32.1 g/dL (ref 31.5–35.7)
MCV: 92 fL (ref 79–97)
Platelets: 208 x10E3/uL (ref 150–450)
RBC: 4.69 x10E6/uL (ref 3.77–5.28)
RDW: 14.7 % (ref 11.7–15.4)
WBC: 7 x10E3/uL (ref 3.4–10.8)

## 2024-06-22 NOTE — Pre-Procedure Instructions (Signed)
 Instructed patient on the following items: Arrival time 1130 Nothing to eat or drink after midnight No meds AM of procedure Responsible person to drive you home and stay with you for 24 hrs  Have you missed any doses of anti-coagulant Eliquis - takes twice a day, hasn't missed any doses in last 4 weeks.  Don't take dose morning of procedure.

## 2024-06-23 ENCOUNTER — Other Ambulatory Visit: Payer: Self-pay

## 2024-06-23 ENCOUNTER — Ambulatory Visit (HOSPITAL_COMMUNITY)
Admission: RE | Admit: 2024-06-23 | Discharge: 2024-06-23 | Disposition: A | Discharge status: Home / Self Care | Attending: Cardiology | Admitting: Cardiology

## 2024-06-23 ENCOUNTER — Encounter (HOSPITAL_COMMUNITY)
Admission: RE | Disposition: A | Discharge status: Home / Self Care | Payer: Self-pay | Source: Home / Self Care | Attending: Cardiology

## 2024-06-23 ENCOUNTER — Encounter (HOSPITAL_COMMUNITY): Payer: Self-pay | Admitting: Cardiology

## 2024-06-23 ENCOUNTER — Ambulatory Visit (HOSPITAL_COMMUNITY): Payer: Self-pay | Admitting: Anesthesiology

## 2024-06-23 DIAGNOSIS — Z79899 Other long term (current) drug therapy: Secondary | ICD-10-CM | POA: Insufficient documentation

## 2024-06-23 DIAGNOSIS — E890 Postprocedural hypothyroidism: Secondary | ICD-10-CM | POA: Diagnosis not present

## 2024-06-23 DIAGNOSIS — E785 Hyperlipidemia, unspecified: Secondary | ICD-10-CM | POA: Diagnosis not present

## 2024-06-23 DIAGNOSIS — Z7901 Long term (current) use of anticoagulants: Secondary | ICD-10-CM | POA: Insufficient documentation

## 2024-06-23 DIAGNOSIS — I4819 Other persistent atrial fibrillation: Secondary | ICD-10-CM | POA: Diagnosis not present

## 2024-06-23 DIAGNOSIS — E039 Hypothyroidism, unspecified: Secondary | ICD-10-CM

## 2024-06-23 DIAGNOSIS — Z853 Personal history of malignant neoplasm of breast: Secondary | ICD-10-CM | POA: Diagnosis not present

## 2024-06-23 DIAGNOSIS — I10 Essential (primary) hypertension: Secondary | ICD-10-CM

## 2024-06-23 DIAGNOSIS — Z8249 Family history of ischemic heart disease and other diseases of the circulatory system: Secondary | ICD-10-CM | POA: Diagnosis not present

## 2024-06-23 DIAGNOSIS — I4891 Unspecified atrial fibrillation: Secondary | ICD-10-CM

## 2024-06-23 DIAGNOSIS — Z9012 Acquired absence of left breast and nipple: Secondary | ICD-10-CM | POA: Insufficient documentation

## 2024-06-23 DIAGNOSIS — Z809 Family history of malignant neoplasm, unspecified: Secondary | ICD-10-CM | POA: Insufficient documentation

## 2024-06-23 HISTORY — PX: ATRIAL FIBRILLATION ABLATION: EP1191

## 2024-06-23 LAB — POCT ACTIVATED CLOTTING TIME: Activated Clotting Time: 368 s

## 2024-06-23 SURGERY — ATRIAL FIBRILLATION ABLATION
Anesthesia: General

## 2024-06-23 MED ORDER — SUGAMMADEX SODIUM 200 MG/2ML IV SOLN
INTRAVENOUS | Status: DC | PRN
  Administered 2024-06-23: 300 mg via INTRAVENOUS

## 2024-06-23 MED ORDER — SODIUM CHLORIDE 0.9% FLUSH: 3.0000 mL | INTRAVENOUS | Status: DC | PRN

## 2024-06-23 MED ORDER — HEPARIN (PORCINE) IN NACL 1000-0.9 UT/500ML-% IV SOLN
INTRAVENOUS | Status: DC | PRN
  Administered 2024-06-23 (×2): 500 mL

## 2024-06-23 MED ORDER — SODIUM CHLORIDE 0.9% FLUSH: 3.0000 mL | Freq: Two times a day (BID) | INTRAVENOUS | Status: DC

## 2024-06-23 MED ORDER — FENTANYL CITRATE (PF) 100 MCG/2ML IJ SOLN
INTRAMUSCULAR | Status: AC
  Filled 2024-06-23: qty 2

## 2024-06-23 MED ORDER — PROPOFOL 10 MG/ML IV BOLUS
INTRAVENOUS | Status: DC | PRN
  Administered 2024-06-23: 100 mg via INTRAVENOUS
  Administered 2024-06-23: 20 mg via INTRAVENOUS

## 2024-06-23 MED ORDER — DEXAMETHASONE SOD PHOSPHATE PF 10 MG/ML IJ SOLN
INTRAMUSCULAR | Status: DC | PRN
  Administered 2024-06-23: 10 mg via INTRAVENOUS

## 2024-06-23 MED ORDER — ATROPINE SULFATE 1 MG/ML IV SOLN
INTRAVENOUS | Status: DC | PRN
  Administered 2024-06-23: 1 mg via INTRAVENOUS

## 2024-06-23 MED ORDER — SODIUM CHLORIDE 0.9 % IV SOLN: 250.0000 mL | INTRAVENOUS | Status: DC | PRN

## 2024-06-23 MED ORDER — ROCURONIUM BROMIDE 10 MG/ML (PF) SYRINGE
PREFILLED_SYRINGE | INTRAVENOUS | Status: DC | PRN
  Administered 2024-06-23: 60 mg via INTRAVENOUS

## 2024-06-23 MED ORDER — PROTAMINE SULFATE 10 MG/ML IV SOLN
INTRAVENOUS | Status: DC | PRN
  Administered 2024-06-23: 40 mg via INTRAVENOUS

## 2024-06-23 MED ORDER — HEPARIN SODIUM (PORCINE) 1000 UNIT/ML IJ SOLN
INTRAMUSCULAR | Status: DC | PRN
  Administered 2024-06-23: 1000 [IU] via INTRAVENOUS

## 2024-06-23 MED ORDER — HEPARIN SODIUM (PORCINE) 1000 UNIT/ML IJ SOLN
INTRAMUSCULAR | Status: DC | PRN
  Administered 2024-06-23: 14000 [IU] via INTRAVENOUS

## 2024-06-23 MED ORDER — ACETAMINOPHEN 500 MG PO TABS
1000.0000 mg | ORAL_TABLET | Freq: Once | ORAL | Status: AC
  Administered 2024-06-23: 1000 mg via ORAL
  Filled 2024-06-23: qty 2

## 2024-06-23 MED ORDER — ACETAMINOPHEN 325 MG PO TABS: 650.0000 mg | ORAL_TABLET | ORAL | Status: DC | PRN

## 2024-06-23 MED ORDER — HEPARIN SODIUM (PORCINE) 1000 UNIT/ML IJ SOLN
INTRAMUSCULAR | Status: AC
  Filled 2024-06-23: qty 10

## 2024-06-23 MED ORDER — EPHEDRINE SULFATE-NACL 50-0.9 MG/10ML-% IV SOSY
PREFILLED_SYRINGE | INTRAVENOUS | Status: DC | PRN
  Administered 2024-06-23: 7.5 mg via INTRAVENOUS

## 2024-06-23 MED ORDER — ONDANSETRON HCL 4 MG/2ML IJ SOLN: 4.0000 mg | Freq: Four times a day (QID) | INTRAMUSCULAR | Status: DC | PRN

## 2024-06-23 MED ORDER — FENTANYL CITRATE (PF) 250 MCG/5ML IJ SOLN
INTRAMUSCULAR | Status: DC | PRN
  Administered 2024-06-23 (×2): 50 ug via INTRAVENOUS

## 2024-06-23 MED ORDER — PHENYLEPHRINE HCL-NACL 20-0.9 MG/250ML-% IV SOLN
INTRAVENOUS | Status: DC | PRN
  Administered 2024-06-23: 30 ug/min via INTRAVENOUS

## 2024-06-23 MED ORDER — PHENYLEPHRINE 80 MCG/ML (10ML) SYRINGE FOR IV PUSH (FOR BLOOD PRESSURE SUPPORT)
PREFILLED_SYRINGE | INTRAVENOUS | Status: DC | PRN
  Administered 2024-06-23 (×2): 80 ug via INTRAVENOUS

## 2024-06-23 MED ORDER — LIDOCAINE 2% (20 MG/ML) 5 ML SYRINGE
INTRAMUSCULAR | Status: DC | PRN
  Administered 2024-06-23: 60 mg via INTRAVENOUS

## 2024-06-23 MED ORDER — SODIUM CHLORIDE 0.9 % IV SOLN: INTRAVENOUS | Status: DC

## 2024-06-23 MED ORDER — ONDANSETRON HCL 4 MG/2ML IJ SOLN
INTRAMUSCULAR | Status: DC | PRN
  Administered 2024-06-23: 4 mg via INTRAVENOUS

## 2024-06-23 SURGICAL SUPPLY — 14 items
CATH GE 8FR SOUNDSTAR (CATHETERS) IMPLANT
CATH OCTARAY 2.0 F 3-3-3-3-3 (CATHETERS) IMPLANT
CATH WEBSTER BI DIR CS D-F CRV (CATHETERS) IMPLANT
CLOSURE MYNX CONTROL 6F/7F (Vascular Products) IMPLANT
CLOSURE PERCLOSE PROSTYLE (Vascular Products) IMPLANT
COVER SWIFTLINK CONNECTOR (BAG) ×1 IMPLANT
KIT VERSACROSS 8.5F 63 45D 180 (KITS) IMPLANT
PACK EP LF (CUSTOM PROCEDURE TRAY) ×1 IMPLANT
PAD DEFIB RADIO PHYSIO CONN (PAD) ×1 IMPLANT
PATCH CARTO3 (PAD) IMPLANT
SHEATH CARTO VIZIGO SM CVD (SHEATH) IMPLANT
SHEATH PINNACLE 8F 10CM (SHEATH) IMPLANT
SHEATH PINNACLE 9F 10CM (SHEATH) IMPLANT
SHEATH PROBE COVER 6X72 (BAG) IMPLANT

## 2024-06-23 NOTE — Anesthesia Preprocedure Evaluation (Addendum)
"                                    Anesthesia Evaluation  Patient identified by MRN, date of birth, ID band Patient awake    Reviewed: Allergy & Precautions, NPO status , Patient's Chart, lab work & pertinent test results, reviewed documented beta blocker date and time   Airway Mallampati: II  TM Distance: >3 FB Neck ROM: Full    Dental  (+) Dental Advisory Given, Partial Lower   Pulmonary neg pulmonary ROS   Pulmonary exam normal breath sounds clear to auscultation       Cardiovascular hypertension, Pt. on medications and Pt. on home beta blockers Normal cardiovascular exam+ dysrhythmias Atrial Fibrillation  Rhythm:Regular Rate:Normal  Echo 05/16/21: 1. Left ventricular ejection fraction, by estimation, is 50% with beat to  beat variablity in afib. The left ventricle has low normal function. The  left ventricle has no regional wall motion abnormalities. There is mild  left ventricular hypertrophy. Left   ventricular diastolic parameters are indeterminate.   2. Right ventricular systolic function is normal. The right ventricular  size is normal. There is normal pulmonary artery systolic pressure. The  estimated right ventricular systolic pressure is 27.8 mmHg.   3. Left atrial size was severely dilated.   4. Right atrial size was severely dilated.   5. The mitral valve is grossly normal. Mild mitral valve regurgitation.  No evidence of mitral stenosis.   6. The aortic valve is tricuspid. There is mild thickening of the aortic  valve. Aortic valve regurgitation is mild. No aortic stenosis is present.   7. The inferior vena cava is normal in size with greater than 50%  respiratory variability, suggesting right atrial pressure of 3 mmHg.      Neuro/Psych negative neurological ROS  negative psych ROS   GI/Hepatic negative GI ROS, Neg liver ROS,,,  Endo/Other  Hypothyroidism    Renal/GU negative Renal ROS     Musculoskeletal negative musculoskeletal  ROS (+)    Abdominal   Peds  Hematology  (+) Blood dyscrasia (Eliquis )   Anesthesia Other Findings Day of surgery medications reviewed with the patient.  Reproductive/Obstetrics                              Anesthesia Physical Anesthesia Plan  ASA: 3  Anesthesia Plan: General   Post-op Pain Management:    Induction: Intravenous  PONV Risk Score and Plan: 3 and Ondansetron  and Dexamethasone   Airway Management Planned: Oral ETT  Additional Equipment:   Intra-op Plan:   Post-operative Plan: Extubation in OR  Informed Consent: I have reviewed the patients History and Physical, chart, labs and discussed the procedure including the risks, benefits and alternatives for the proposed anesthesia with the patient or authorized representative who has indicated his/her understanding and acceptance.     Dental advisory given  Plan Discussed with: CRNA  Anesthesia Plan Comments:          Anesthesia Quick Evaluation  "

## 2024-06-23 NOTE — Anesthesia Procedure Notes (Signed)
 Procedure Name: Intubation Date/Time: 06/23/2024 2:12 PM  Performed by: Elby Raelene SAUNDERS, CRNAPre-anesthesia Checklist: Patient identified, Emergency Drugs available, Suction available and Patient being monitored Patient Re-evaluated:Patient Re-evaluated prior to induction Oxygen Delivery Method: Circle System Utilized Preoxygenation: Pre-oxygenation with 100% oxygen Induction Type: IV induction Ventilation: Mask ventilation without difficulty Laryngoscope Size: Miller and 2 Tube type: Oral Tube size: 7.0 mm Number of attempts: 1 Airway Equipment and Method: Stylet, Oral airway and Bite block Placement Confirmation: ETT inserted through vocal cords under direct vision, positive ETCO2 and breath sounds checked- equal and bilateral Secured at: 21 cm Tube secured with: Tape Dental Injury: Teeth and Oropharynx as per pre-operative assessment

## 2024-06-23 NOTE — Progress Notes (Signed)
 Nurse to the bedside to ambulate with patient, no complaint of dizziness, headache or blurred vision. Pain 0/10. Bilateral femoral sites without hematoma nor active bleeding. Dressings clean, dry and intact. Steady gate when ambulating patient to the bathroom for void. Positive void.  Patient has tolerated regular diet without emesis. Ready for discharge. Discharge instructions reviewed with patient and her daughter, questions asked and answered, copy given. Dr. Inocencio spoke with family and patient, questions asked and answered.

## 2024-06-23 NOTE — H&P (Signed)
 "   Electrophysiology Office Note   Date:  06/23/2024   ID:  Angela, Dean 08/22/1955, MRN [id number]  PCP:  Gladis Mustard, FNP  Cardiologist:  Ronal Ross Primary Electrophysiologist:  Danni Leabo Gladis Norton, MD    Chief Complaint: AF   History of Present Illness: Angela Dean is a 68 y.o. female who is being seen today for the evaluation of AF at the request of No ref. provider found. Presenting today for electrophysiology evaluation.  She has a history of atrial fibrillation, thyroid  nodule post thyroidectomy, breast cancer postmastectomy on the left.  She was she is on dofetilide .  She unfortunately had more frequent episodes of atrial fibrillation and is now post ablation 08/09/2022.  Today, denies symptoms of palpitations, chest pain, dyspnea, orthopnea, PND, lower extremity edema, claudication, dizziness, presyncope, syncope, bleeding, or neurologic sequela. The patient is tolerating medications without difficulties. Plan ablation today.    Past Medical History:  Diagnosis Date   Atrial fibrillation (HCC)    Breast cancer (HCC)    Cancer (HCC)    Thyroid  disease    Past Surgical History:  Procedure Laterality Date   ATRIAL FIBRILLATION ABLATION N/A 08/09/2022   Procedure: ATRIAL FIBRILLATION ABLATION;  Surgeon: Norton Soyla Gladis, MD;  Location: [facility] INVASIVE CV LAB;  Service: Cardiovascular;  Laterality: N/A;   CARDIOVERSION N/A 06/20/2021   Procedure: CARDIOVERSION;  Surgeon: Barbaraann Darryle Ned, MD;  Location: [facility] ENDOSCOPY;  Service: Cardiovascular;  Laterality: N/A;   CARDIOVERSION N/A 07/20/2021   Procedure: CARDIOVERSION;  Surgeon: Pietro Redell RAMAN, MD;  Location: [facility] ENDOSCOPY;  Service: Cardiovascular;  Laterality: N/A;   CARDIOVERSION N/A 01/23/2022   Procedure: CARDIOVERSION;  Surgeon: Francyne Headland, MD;  Location: [facility] ENDOSCOPY;  Service: Cardiovascular;  Laterality: N/A;   CARDIOVERSION N/A 02/28/2024   Procedure: CARDIOVERSION;  Surgeon: Shlomo Wilbert JONELLE, MD;  Location: [facility] INVASIVE CV LAB;  Service: Cardiovascular;  Laterality: N/A;   CARDIOVERSION N/A 05/11/2024   Procedure: CARDIOVERSION;  Surgeon: Raford Riggs, MD;  Location: [facility] INVASIVE CV LAB;  Service: Cardiovascular;  Laterality: N/A;   MASTECTOMY Left 1997     Current Facility-Administered Medications  Medication Dose Route Frequency Provider Last Rate Last Admin   0.9 %  sodium chloride  infusion   Intravenous Continuous Norton Soyla Gladis, MD 40 mL/hr at 06/23/24 1207 New Bag at 06/23/24 1207    Allergies:   Patient has no known allergies.   Social History:  The patient  reports that she has never smoked. She has never used smokeless tobacco. She reports that she does not drink alcohol and does not use drugs.   Family History:  The patient's family history includes Cancer in her sister, sister, and sister; Heart disease in her mother; Hypertension in her mother.   ROS:  Please see the history of present illness.   Otherwise, review of systems is positive for none.   All other systems are reviewed and negative.   PHYSICAL EXAM: VS:  BP (!) 126/90   Pulse 63   Temp 98.1 F (36.7 C) (Oral)   Resp 18   Ht 5' 4 (1.626 m)   Wt 71.7 kg   SpO2 97%   BMI 27.12 kg/m  , BMI Body mass index is 27.12 kg/m. GEN: Well nourished, well developed in no acute distress NECK: No JVD; No carotid bruits CARDIAC: Regular rate and rhythm, no murmurs, rubs, gallops RESPIRATORY:  Clear to auscultation without rales, wheezing or rhonchi  ABDOMEN: Soft, non-tender, non-distended EXTREMITIES:  No  edema; No deformity    EKG:  EKG is ordered today. Personal review of the ekg ordered shows sinus rhythm, rate 67   Recent Labs: 02/25/2024: Magnesium  2.1 05/05/2024: ALT 20; TSH 3.870 06/09/2024: BUN 13; Creatinine, Ser 0.97; Hemoglobin 13.8; Platelets 208; Potassium 5.1; Sodium 139    Lipid Panel     Component Value Date/Time   CHOL 159 09/24/2023 1058   TRIG 65 09/24/2023 1058    TRIG 83 06/03/2014 1104   HDL 60 09/24/2023 1058   HDL 77 06/03/2014 1104   CHOLHDL 2.7 09/24/2023 1058   LDLCALC 86 09/24/2023 1058   LDLCALC 109 (H) 02/17/2013 1134     Wt Readings from Last 3 Encounters:  06/23/24 71.7 kg  05/11/24 71.7 kg  05/05/24 73 kg      Other studies Reviewed: Additional studies/ records that were reviewed today include: TTE 05/17/21  Review of the above records today demonstrates:   1. Left ventricular ejection fraction, by estimation, is 50% with beat to  beat variablity in afib. The left ventricle has low normal function. The  left ventricle has no regional wall motion abnormalities. There is mild  left ventricular hypertrophy. Left   ventricular diastolic parameters are indeterminate.   2. Right ventricular systolic function is normal. The right ventricular  size is normal. There is normal pulmonary artery systolic pressure. The  estimated right ventricular systolic pressure is 27.8 mmHg.   3. Left atrial size was severely dilated.   4. Right atrial size was severely dilated.   5. The mitral valve is grossly normal. Mild mitral valve regurgitation.  No evidence of mitral stenosis.   6. The aortic valve is tricuspid. There is mild thickening of the aortic  valve. Aortic valve regurgitation is mild. No aortic stenosis is present.   7. The inferior vena cava is normal in size with greater than 50%  respiratory variability, suggesting right atrial pressure of 3 mmHg.   Cardiac monitor 12/22/2021 personally reviewed 100% atrial fibrillation burden Less than 1% ventricular ectopy Heart rate average 100 bpm No triggered episodes  ASSESSMENT AND PLAN:  1.  Persistent atrial fibrillation: [name] has presented today for surgery, with the diagnosis of AF.  The various methods of treatment have been discussed with the patient and family. After consideration of risks, benefits and other options for treatment, the patient has consented to   Procedure(s): Catheter ablation as a surgical intervention .  Risks include but not limited to complete heart block, stroke, esophageal damage, nerve damage, bleeding, vascular damage, tamponade, perforation, MI, and death. The patient's history has been reviewed, patient examined, no change in status, stable for surgery.  I have reviewed the patient's chart and labs.  Questions were answered to the patient's satisfaction.    Lexi Conaty Inocencio, MD 06/23/2024 12:52 PM  [phone] (fax)  "

## 2024-06-23 NOTE — Transfer of Care (Signed)
 Immediate Anesthesia Transfer of Care Note  Patient: Angela Dean  Procedure(s) Performed: ATRIAL FIBRILLATION ABLATION  Patient Location: Cath Lab  Anesthesia Type:General  Level of Consciousness: awake, alert , and oriented  Airway & Oxygen Therapy: Patient Spontanous Breathing  Post-op Assessment: Report given to RN and Post -op Vital signs reviewed and stable  Post vital signs: Reviewed and stable  Last Vitals:  Vitals Value Taken Time  BP    Temp    Pulse 69 06/23/24 15:10  Resp 19 06/23/24 15:10  SpO2 93 % 06/23/24 15:10  Vitals shown include unfiled device data.  Last Pain:  Vitals:   06/23/24 1210  TempSrc: Oral         Complications: There were no known notable events for this encounter.

## 2024-06-23 NOTE — Discharge Instructions (Addendum)
 Care After your procedure  This sheet gives you information about how to care for yourself after your procedure. Your health care provider may also give you more specific instructions. If you have problems or questions, contact your health care provider. What can I expect after the procedure? After the procedure, it is common to have: Bruising around your puncture site. Tenderness around your puncture site. Skipped heartbeats. If you had an atrial fibrillation ablation, you may have atrial fibrillation during the first several months after your procedure.  Tiredness (fatigue).  Follow these instructions at home: Puncture site care  Follow instructions from your health care provider about how to take care of your puncture site. Make sure you: If present, leave stitches (sutures), skin glue, or adhesive strips in place. These skin closures may need to stay in place for up to 2 weeks. If adhesive strip edges start to loosen and curl up, you may trim the loose edges. Do not remove adhesive strips completely unless your health care provider tells you to do that. If a large square bandage is present, this may be removed 24 hours after surgery.  Check your puncture site every day for signs of infection. Check for: Redness, swelling, or pain. Fluid or blood. If your puncture site starts to bleed, lie down on your back, apply firm pressure to the area, and contact your health care provider. Warmth. Pus or a bad smell. A pea or marble sized lump/knot at the site is normal and can take up to three months to resolve.  Driving Do not drive for at least 4 days after your procedure or however long your health care provider recommends. (Do not resume driving if you have previously been instructed not to drive for other health reasons.) Do not drive or use heavy machinery while taking prescription pain medicine. Activity Avoid activities that take a lot of effort for at least 7 days after your procedure. Do  not lift anything that is heavier than 5 lb (4.5 kg) for one week.  No sexual activity for 1 week.  Return to your normal activities as told by your health care provider. Ask your health care provider what activities are safe for you. General instructions Take over-the-counter and prescription medicines only as told by your health care provider. Do not use any products that contain nicotine or tobacco, such as cigarettes and e-cigarettes. If you need help quitting, ask your health care provider. You may shower after 24 hours, but Do not take baths, swim, or use a hot tub for 1 week.  Do not drink alcohol for 24 hours after your procedure. Keep all follow-up visits as told by your health care provider. This is important. Contact a health care provider if: You have redness, mild swelling, or pain around your puncture site. You have fluid or blood coming from your puncture site that stops after applying firm pressure to the area. Your puncture site feels warm to the touch. You have pus or a bad smell coming from your puncture site. You have a fever. You have chest pain or discomfort that spreads to your neck, jaw, or arm. You have chest pain that is worse with lying on your back or taking a deep breath. You are sweating a lot. You feel nauseous. You have a fast or irregular heartbeat. You have shortness of breath. You are dizzy or light-headed and feel the need to lie down. You have pain or numbness in the arm or leg closest to your puncture site.  Get help right away if: Your puncture site suddenly swells. Your puncture site is bleeding and the bleeding does not stop after applying firm pressure to the area. These symptoms may represent a serious problem that is an emergency. Do not wait to see if the symptoms will go away. Get medical help right away. Call your local emergency services (911 in the [place] Do not drive yourself to the hospital. Summary After the procedure, it is normal to  have bruising and tenderness at the puncture site in your groin, neck, or forearm. Check your puncture site every day for signs of infection. Get help right away if your puncture site is bleeding and the bleeding does not stop after applying firm pressure to the area. This is a medical emergency. This information is not intended to replace advice given to you by your health care provider. Make sure you discuss any questions you have with your health care provider.  Femoral Site Care This sheet gives you information about how to care for yourself after your procedure. Your health care provider may also give you more specific instructions. If you have problems or questions, contact your health care provider. What can I expect after the procedure?  After the procedure, it is common to have: Bruising that usually fades within 1-2 weeks. Tenderness at the site. Follow these instructions at home: Wound care Follow instructions from your health care provider about how to take care of your insertion site. Make sure you: Wash your hands with soap and water before you change your bandage (dressing). If soap and water are not available, use hand sanitizer. Remove your dressing as told by your health care provider. In 24 hours Do not take baths, swim, or use a hot tub until your health care provider approves. You may shower 24-48 hours after the procedure or as told by your health care provider. Gently wash the site with plain soap and water. Pat the area dry with a clean towel. Do not rub the site. This may cause bleeding. Do not apply powder or lotion to the site. Keep the site clean and dry. Check your femoral site every day for signs of infection. Check for: Redness, swelling, or pain. Fluid or blood. Warmth. Pus or a bad smell. Activity For the first 2-3 days after your procedure, or as long as directed: Avoid climbing stairs as much as possible. Do not squat. Do not lift anything that is  heavier than 10 lb (4.5 kg), or the limit that you are told, until your health care provider says that it is safe. For 5 days Rest as directed. Avoid sitting for a long time without moving. Get up to take short walks every 1-2 hours. Do not drive for 24 hours if you were given a medicine to help you relax (sedative). General instructions Take over-the-counter and prescription medicines only as told by your health care provider. Keep all follow-up visits as told by your health care provider. This is important. Contact a health care provider if you have: A fever or chills. You have redness, swelling, or pain around your insertion site. Get help right away if: The catheter insertion area swells very fast. You pass out. You suddenly start to sweat or your skin gets clammy. The catheter insertion area is bleeding, and the bleeding does not stop when you hold steady pressure on the area. The area near or just beyond the catheter insertion site becomes pale, cool, tingly, or numb. These symptoms may represent a serious  problem that is an emergency. Do not wait to see if the symptoms will go away. Get medical help right away. Call your local emergency services (911 in the [place] Do not drive yourself to the hospital. Summary After the procedure, it is common to have bruising that usually fades within 1-2 weeks. Check your femoral site every day for signs of infection. Do not lift anything that is heavier than 10 lb (4.5 kg), or the limit that you are told, until your health care provider says that it is safe. This information is not intended to replace advice given to you by your health care provider. Make sure you discuss any questions you have with your health care provider. Document Revised: 06/24/2017 Document Reviewed: 06/24/2017 [employer]vier Patient Education  2020 [employer].

## 2024-06-24 ENCOUNTER — Telehealth (HOSPITAL_COMMUNITY): Payer: Self-pay

## 2024-06-24 ENCOUNTER — Encounter (HOSPITAL_COMMUNITY): Payer: Self-pay | Admitting: Cardiology

## 2024-06-24 NOTE — Telephone Encounter (Signed)
 Spoke with patient to complete post procedure follow up call.  Patient reports no complications with groin sites.   Instructions reviewed with patient:  Remove large bandage at puncture site after 24 hours. It is normal to have bruising, tenderness, mild swelling, and a pea or marble sized lump/knot at the groin site which can take up to three months to resolve.  Get help right away if you notice sudden swelling at the puncture site.  Check your puncture site every day for signs of infection: fever, redness, swelling, pus drainage, warmth, foul odor or excessive pain. If this occurs, please call [phone], to speak with the RN Navigator. Get help right away if your puncture site is bleeding and the bleeding does not stop after applying firm pressure to the area.  You may continue to have skipped beats/ atrial fibrillation during the first several months after your procedure.  It is very important not to miss any doses of your blood thinner Eliquis .    You will follow up with the Afib clinic 4 weeks after your procedure and follow up with the Afib clinic 3 months after your procedure.  Activity restrictions reviewed.  Patient verbalized understanding to all instructions provided.

## 2024-06-29 NOTE — Anesthesia Postprocedure Evaluation (Signed)
"   Anesthesia Post Note  Patient: Angela Dean  Procedure(s) Performed: ATRIAL FIBRILLATION ABLATION     Patient location during evaluation: PACU Anesthesia Type: General Level of consciousness: awake Pain management: pain level controlled Vital Signs Assessment: post-procedure vital signs reviewed and stable Respiratory status: spontaneous breathing Cardiovascular status: blood pressure returned to baseline Postop Assessment: no apparent nausea or vomiting Anesthetic complications: no                 Lauraine T Colhoun      "

## 2024-07-10 ENCOUNTER — Other Ambulatory Visit: Payer: Self-pay | Admitting: Nurse Practitioner

## 2024-07-10 DIAGNOSIS — Z1231 Encounter for screening mammogram for malignant neoplasm of breast: Secondary | ICD-10-CM

## 2024-07-20 ENCOUNTER — Ambulatory Visit (HOSPITAL_COMMUNITY): Admitting: Physician Assistant

## 2024-07-20 ENCOUNTER — Ambulatory Visit

## 2024-07-21 ENCOUNTER — Ambulatory Visit (HOSPITAL_COMMUNITY): Admitting: Physician Assistant

## 2024-08-03 ENCOUNTER — Ambulatory Visit

## 2024-08-03 ENCOUNTER — Ambulatory Visit (HOSPITAL_COMMUNITY): Payer: PRIVATE HEALTH INSURANCE | Admitting: Physician Assistant

## 2024-09-15 ENCOUNTER — Ambulatory Visit: Payer: Self-pay | Admitting: Nurse Practitioner

## 2024-09-21 ENCOUNTER — Ambulatory Visit (HOSPITAL_COMMUNITY): Admitting: Physician Assistant

## 2024-09-22 ENCOUNTER — Ambulatory Visit (HOSPITAL_COMMUNITY): Admitting: Physician Assistant
